# Patient Record
Sex: Male | Born: 1935
Health system: Southern US, Community
[De-identification: ages and names within clinical notes are randomized; demographics above are authoritative.]

## PROBLEM LIST (undated history)

## (undated) DIAGNOSIS — N183 Chronic kidney disease, stage 3 unspecified: Secondary | ICD-10-CM

## (undated) DIAGNOSIS — K5790 Diverticulosis of intestine, part unspecified, without perforation or abscess without bleeding: Secondary | ICD-10-CM

## (undated) DIAGNOSIS — Z86010 Personal history of colonic polyps: Secondary | ICD-10-CM

## (undated) DIAGNOSIS — I10 Essential (primary) hypertension: Secondary | ICD-10-CM

## (undated) DIAGNOSIS — K649 Unspecified hemorrhoids: Secondary | ICD-10-CM

## (undated) DIAGNOSIS — H9190 Unspecified hearing loss, unspecified ear: Secondary | ICD-10-CM

## (undated) DIAGNOSIS — C050 Malignant neoplasm of hard palate: Secondary | ICD-10-CM

## (undated) HISTORY — PX: INSERT / REPLACE / REMOVE PACEMAKER: SUR710

## (undated) HISTORY — DX: Unspecified hemorrhoids: K64.9

## (undated) HISTORY — DX: Chronic kidney disease, stage 3 (moderate): N18.3

## (undated) HISTORY — DX: Diverticulosis of intestine, part unspecified, without perforation or abscess without bleeding: K57.90

## (undated) HISTORY — DX: Chronic kidney disease, stage 3 unspecified: N18.30

## (undated) HISTORY — DX: Personal history of colonic polyps: Z86.010

## (undated) HISTORY — DX: Malignant neoplasm of hard palate: C05.0

---

## 2001-03-06 DIAGNOSIS — C050 Malignant neoplasm of hard palate: Secondary | ICD-10-CM

## 2001-03-06 HISTORY — PX: MAXILLECTOMY: SUR858

## 2001-03-06 HISTORY — DX: Malignant neoplasm of hard palate: C05.0

## 2001-05-09 ENCOUNTER — Encounter: Admission: RE | Admit: 2001-05-09 | Discharge: 2001-05-09 | Payer: Self-pay | Admitting: Otolaryngology

## 2001-05-09 ENCOUNTER — Encounter: Payer: Self-pay | Admitting: Otolaryngology

## 2001-05-10 ENCOUNTER — Ambulatory Visit (HOSPITAL_COMMUNITY): Admission: RE | Admit: 2001-05-10 | Discharge: 2001-05-12 | Payer: Self-pay | Admitting: Otolaryngology

## 2001-05-10 ENCOUNTER — Encounter (INDEPENDENT_AMBULATORY_CARE_PROVIDER_SITE_OTHER): Payer: Self-pay | Admitting: *Deleted

## 2004-03-06 DIAGNOSIS — Z86010 Personal history of colonic polyps: Secondary | ICD-10-CM

## 2004-03-06 DIAGNOSIS — Z860101 Personal history of adenomatous and serrated colon polyps: Secondary | ICD-10-CM

## 2004-03-06 HISTORY — DX: Personal history of adenomatous and serrated colon polyps: Z86.0101

## 2004-03-06 HISTORY — DX: Personal history of colonic polyps: Z86.010

## 2004-06-23 ENCOUNTER — Encounter: Admission: RE | Admit: 2004-06-23 | Discharge: 2004-06-23 | Payer: Self-pay | Admitting: Nephrology

## 2004-07-01 ENCOUNTER — Ambulatory Visit (HOSPITAL_COMMUNITY): Admission: RE | Admit: 2004-07-01 | Discharge: 2004-07-01 | Payer: Self-pay | Admitting: Nephrology

## 2004-09-15 ENCOUNTER — Ambulatory Visit: Payer: Self-pay | Admitting: Internal Medicine

## 2004-09-29 ENCOUNTER — Encounter (INDEPENDENT_AMBULATORY_CARE_PROVIDER_SITE_OTHER): Payer: Self-pay | Admitting: *Deleted

## 2004-09-29 ENCOUNTER — Ambulatory Visit: Payer: Self-pay | Admitting: Internal Medicine

## 2004-09-29 DIAGNOSIS — Z860100 Personal history of colon polyps, unspecified: Secondary | ICD-10-CM | POA: Insufficient documentation

## 2006-03-19 ENCOUNTER — Encounter: Admission: RE | Admit: 2006-03-19 | Discharge: 2006-03-19 | Payer: Self-pay | Admitting: *Deleted

## 2007-10-15 ENCOUNTER — Ambulatory Visit: Payer: Self-pay | Admitting: Internal Medicine

## 2007-10-29 ENCOUNTER — Ambulatory Visit: Payer: Self-pay | Admitting: Internal Medicine

## 2007-11-02 HISTORY — PX: COLONOSCOPY: SHX174

## 2008-04-01 ENCOUNTER — Encounter: Admission: RE | Admit: 2008-04-01 | Discharge: 2008-04-01 | Payer: Self-pay | Admitting: *Deleted

## 2010-01-25 ENCOUNTER — Encounter: Admission: RE | Admit: 2010-01-25 | Discharge: 2010-01-25 | Payer: Self-pay | Admitting: Internal Medicine

## 2010-07-22 NOTE — Op Note (Signed)
[place]. [facility]  Patient:    Wesley Garcia, Wesley Garcia Visit Number: [id number] MRN: [id number]          Service Type: DSU Location: [id number] Attending Physician:  Beckie Salts Dictated by:   Anabel Bene Constance Holster, M.D. Proc. Date: 05/10/01 Admit Date:  05/10/2001 Discharge Date: 05/12/2001   CC:         Robyn Haber, M.D.  Loralee Pacas. Terence Lux, D.D.S.   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  OPERATION PERFORMED:  Partial maxillectomy.  SURGEON:  Jefry H. Constance Holster, M.D.  ASSISTANT:  None.  ANESTHESIA:  General endotracheal.  COMPLICATIONS:  None.  ESTIMATED BLOOD LOSS:  120 cc.  FINDINGS:  Mainly ulcerative lesion infiltrating into the bone of the upper anterior alveolar ridge extending up approaching the piriform aperture without any direct actual extension in to the piriform aperture and without any obvious extension into the maxillary sinus.  REFERRING PHYSICIAN:  Robyn Haber, M.D.  INDICATIONS FOR PROCEDURE:  The patient is a 77 year old with a long history of poorly fitting dentures who has had a sore in the upper gum for many years but recently started to enlarge  and cause significant discomfort.  Biopsy was performed revealing well differentiated squamous cell carcinoma.  The risks, benefits, alternatives and complications of the procedure were explained to the patient who seemed to understand and agreed to surgery.  DESCRIPTION OF PROCEDURE:  The patient was taken to the operating room and placed on the operating table in the supine position.  Following induction of general endotracheal anesthesia via the nasal endotracheal route.  The face was prepped and draped in the standard fashion.  1 - Excision left superior pinna lesion.  1% Xylocaine  was infiltrated into the superior aspect of the left pinna.  An ellipse of skin approximately 1.5 cm by 0.75 cm was created with a 15 scalpel after infiltrating with Xylocaine with epinephrine.  The lesion was sent in its entirety for pathologic evaluation.  The incision was closed using running 5-0 nylon suture.  2 - Partial maxillectomy.  Electrocautery was used to outline the proposed incisions along the mucosa.  An incision was created in the inner surface of the upper lip across the midline extending down to the alveolar mucosa across the hard palate and back across the posterior aspect of the right lateral alveolar ridge.  Electrocautery was used to incise the mucosa and down through the submucosal tissue through muscular layers and eventually to the bone of the maxilla.  The superior most cut was just inferior to the infraorbital nerve.  The nerve itself was sacrificed and sent for margins with frozen section which were clear.  The face of the maxilla was first cut using a high speed electric drill with side-cutting bur.  The cut was continued down to the piriform aperture.  The nasal mucosa was left intact except for one small tear but the floor of the nose was sacrificed to provide a bony margin. The mucosa itself was healthy and clear of any obvious involvement with tumor.  The bony cuts were then continued down along the left anterior alveolar ridge and large osteotomes were used to perform osteotomies along the hard palate. After having completed all the bony cuts, the remaining soft tissue attachments were divided.  Any vessels identified were ligated with silk  suture. Electrocautery was used for the remainder of the hemostasis.  The specimen was removed in its entirely.  It contained the right anterior and lateral alveolar ridge, the nasal floor bone, approximately 1.5 to 2 cm from the nasal spine to the posterior aspect and the majority of the floor of the maxillary  sinus. The mucosa of the maxillary sinus was all intact except for right in the center of the specimen was a small mucosal cyst.  There was no obvious tumor identified in the mucosa.  All gross margins looked clear.  Additional soft tissue margins were taken around the soft tissue adjacent to the piriform aperture and the soft tissue deep to the lateral superior mucosal margin. These were all clear of any obvious carcinoma on frozen section.  In order to facilitate the dissection a lip-splitting approach was used as well.  The right side of the philtrum was incised, continued up around the lateral nasal ala on the right side.  The lip and cheek soft tissue was all retracted laterally.  This was all resutured at the end in two layers using 5-0 nylon on the skin carefully reapproximating the vermilion border.  The deep closure was performed with 4-0 chromic.  The vermilion mucosa was reapproximated with interrupted inverted 4-0 chromic sutures.  The palatal defect was packed with two sheets of Xeroform gauze and four additional sheets of Vaseline impregnated gauze.  A suspension bolster was created with 2-0 silk sutures starting from the midline palate mucosa to the lateral buccal mucosa.  This held the packing in place nicely.  At the termination of the procedure, the nasal tube was removed and the patient was reintubated orally.  The right nasal cavity was packed with a large Merocel which was coated with bacitracin ointment and injected with lidocaine with epinephrine solution.  The gastric contents were suctioned with a nasogastric tube.  The patient was awakened from anesthesia, extubated and transferred to recovery in stable condition.Dictated by:   Anabel Bene Constance Holster, M.D. Attending Physician:  Beckie Salts DD:  05/10/01 TD:  05/13/01 Job: [id number]

## 2011-02-05 ENCOUNTER — Emergency Department (HOSPITAL_COMMUNITY)
Admission: EM | Admit: 2011-02-05 | Discharge: 2011-02-05 | Disposition: A | Discharge status: Home / Self Care | Payer: Medicare Other | Attending: Emergency Medicine | Admitting: Emergency Medicine

## 2011-02-05 ENCOUNTER — Encounter: Payer: Self-pay | Admitting: *Deleted

## 2011-02-05 DIAGNOSIS — Z79899 Other long term (current) drug therapy: Secondary | ICD-10-CM | POA: Insufficient documentation

## 2011-02-05 DIAGNOSIS — R059 Cough, unspecified: Secondary | ICD-10-CM | POA: Insufficient documentation

## 2011-02-05 DIAGNOSIS — R070 Pain in throat: Secondary | ICD-10-CM | POA: Insufficient documentation

## 2011-02-05 DIAGNOSIS — R05 Cough: Secondary | ICD-10-CM | POA: Insufficient documentation

## 2011-02-05 DIAGNOSIS — R6889 Other general symptoms and signs: Secondary | ICD-10-CM | POA: Insufficient documentation

## 2011-02-05 DIAGNOSIS — J40 Bronchitis, not specified as acute or chronic: Secondary | ICD-10-CM

## 2011-02-05 DIAGNOSIS — R079 Chest pain, unspecified: Secondary | ICD-10-CM | POA: Insufficient documentation

## 2011-02-05 DIAGNOSIS — E119 Type 2 diabetes mellitus without complications: Secondary | ICD-10-CM | POA: Insufficient documentation

## 2011-02-05 MED ORDER — ACETAMINOPHEN-CODEINE #3 300-30 MG PO TABS
1.0000 | ORAL_TABLET | Freq: Once | ORAL | Status: AC
  Administered 2011-02-05: 1 via ORAL
  Filled 2011-02-05: qty 1

## 2011-02-05 MED ORDER — AZITHROMYCIN 250 MG PO TABS: ORAL_TABLET | ORAL | Status: AC

## 2011-02-05 MED ORDER — ALBUTEROL SULFATE HFA 108 (90 BASE) MCG/ACT IN AERS: 2.0000 | INHALATION_SPRAY | RESPIRATORY_TRACT | Status: DC | PRN

## 2011-02-05 MED ORDER — ALBUTEROL SULFATE HFA 108 (90 BASE) MCG/ACT IN AERS
2.0000 | INHALATION_SPRAY | RESPIRATORY_TRACT | Status: DC | PRN
  Administered 2011-02-05 (×2): 2 via RESPIRATORY_TRACT
  Filled 2011-02-05: qty 6.7

## 2011-02-05 NOTE — ED Notes (Signed)
Patient c/o chest congestion and cough onset 2 weeks ago. States his chest is hurting worse with cough. C/o bilateral earpain

## 2011-02-05 NOTE — ED Provider Notes (Signed)
History     CSN: [id number] Arrival date & time: 02/05/2011  6:44 AM   First MD Initiated Contact with Patient 02/05/11 [phone]      Chief Complaint  Patient presents with  . Sore Throat    nonproductive cough    (Consider location/radiation/quality/duration/timing/severity/associated sxs/prior treatment) HPI  Patient who is an insulin dependent diabetic presents to ER complaining of a 2 week hx of gradual onset cough that has been persistent with associated runny nose and mild sore throat. Patient states "I've been coughing so much it makes my ribs hurt." patient denies known fevers or chills. States he went to see his PCP, Dr. Mariea Clonts last week "and had my heart evaluated" but denies assessment of cough/runny nose/sore throat at that time. States he has been taking OTC cough and cold remedies without relief. Patient states he has had his pneumovax and flu shot this year. Denies known fevers, chills, HA, stiff neck, difficulty swallowing, CP, SOB, wheezing, abdominal pain, n/v/d. Denies aggravating or alleviating factors.   Past Medical History  Diagnosis Date  . Diabetes mellitus   . Renal disorder   . Cancer     Past Surgical History  Procedure Date  . Mouth surgery     History reviewed. No pertinent family history.  History  Substance Use Topics  . Smoking status: Never Smoker   . Smokeless tobacco: Not on file  . Alcohol Use: No      Review of Systems  All other systems reviewed and are negative.    Allergies  Review of patient's allergies indicates no known allergies.  Home Medications   Current Outpatient Rx  Name Route Sig Dispense Refill  . HYDROCHLOROTHIAZIDE 25 MG PO TABS Oral Take 25 mg by mouth daily.      . INSULIN GLARGINE 100 UNIT/ML [place] SOLN Subcutaneous Inject 16 Units into the skin daily.      Marland Kitchen PSEUDOEPHEDRINE HCL 120 MG PO TB12 Oral Take 120 mg by mouth every 12 (twelve) hours.      . ALBUTEROL SULFATE HFA 108 (90 BASE) MCG/ACT IN AERS Inhalation  Inhale 2 puffs into the lungs every 4 (four) hours as needed for wheezing. 1 Inhaler 0  . AZITHROMYCIN 250 MG PO TABS  Take 2 tablets by mouth the first day and then take 1 tablet by mouth for the next 4 days for a total of 5 days. 6 tablet 0    BP 144/81  Pulse 98  Temp(Src) 97.4 F (36.3 C) (Oral)  Resp 20  SpO2 98%  Physical Exam  Vitals reviewed. Constitutional: He is oriented to person, place, and time. He appears well-developed and well-nourished. No distress.  HENT:  Head: Normocephalic and atraumatic.  Right Ear: External ear normal.  Left Ear: External ear normal.  Nose: Nose normal.  Mouth/Throat: No oropharyngeal exudate.       Mild erythema of posterior pharynx and tonsils no tonsillar exudate or enlargement. Patent airway. Swallowing secretions well  Eyes: Conjunctivae and EOM are normal. Pupils are equal, round, and reactive to light.  Neck: Normal range of motion. Neck supple.  Cardiovascular: Normal rate, regular rhythm and normal heart sounds.  Exam reveals no gallop and no friction rub.   No murmur heard. Pulmonary/Chest: Effort normal and breath sounds normal. No respiratory distress. He has no wheezes. He has no rales. He exhibits no tenderness.  Abdominal: Soft. He exhibits no distension and no mass. There is no tenderness. There is no rebound and no guarding.  Lymphadenopathy:  He has no cervical adenopathy.  Neurological: He is alert and oriented to person, place, and time. He has normal reflexes.  Skin: Skin is warm and dry. No rash noted. He is not diaphoretic.  Psychiatric: He has a normal mood and affect.    ED Course  Procedures (including critical care time)  Albuterol inhaler to bedside and PO tylenol #3  Labs Reviewed  RAPID STREP SCREEN  LAB REPORT - SCANNED   No results found.   1. Bronchitis       MDM  Afebrile, non toxic appearing, with pulse ox 98% on room air. Cough x 2 weeks without fever and normal lung exam likely  bronchitis but will cover with abx due to age and duration of cough.   Medical screening examination/treatment/procedure(s) were performed by non-physician practitioner and as supervising physician I was immediately available for consultation/collaboration. Katy Apo, M.D.       Eben Burow, [place] 02/05/11 0840  Mylinda Latina III, MD 02/06/11 [phone]

## 2011-06-05 ENCOUNTER — Other Ambulatory Visit: Payer: Self-pay | Admitting: Internal Medicine

## 2011-06-05 ENCOUNTER — Ambulatory Visit
Admission: RE | Admit: 2011-06-05 | Discharge: 2011-06-05 | Disposition: A | Discharge status: Home / Self Care | Payer: Medicare Other | Source: Ambulatory Visit | Attending: Internal Medicine | Admitting: Internal Medicine

## 2011-06-05 DIAGNOSIS — R52 Pain, unspecified: Secondary | ICD-10-CM

## 2011-07-18 ENCOUNTER — Other Ambulatory Visit: Payer: Self-pay | Admitting: Nephrology

## 2011-07-18 DIAGNOSIS — R52 Pain, unspecified: Secondary | ICD-10-CM

## 2011-07-18 DIAGNOSIS — N183 Chronic kidney disease, stage 3 unspecified: Secondary | ICD-10-CM

## 2011-07-19 ENCOUNTER — Ambulatory Visit
Admission: RE | Admit: 2011-07-19 | Discharge: 2011-07-19 | Disposition: A | Discharge status: Home / Self Care | Payer: Medicare Other | Source: Ambulatory Visit | Attending: Nephrology | Admitting: Nephrology

## 2011-07-19 DIAGNOSIS — N183 Chronic kidney disease, stage 3 unspecified: Secondary | ICD-10-CM

## 2011-07-19 DIAGNOSIS — R52 Pain, unspecified: Secondary | ICD-10-CM

## 2011-08-02 ENCOUNTER — Other Ambulatory Visit: Payer: Self-pay | Admitting: Nephrology

## 2011-08-02 DIAGNOSIS — R109 Unspecified abdominal pain: Secondary | ICD-10-CM

## 2011-08-02 DIAGNOSIS — N183 Chronic kidney disease, stage 3 unspecified: Secondary | ICD-10-CM

## 2011-08-04 ENCOUNTER — Ambulatory Visit
Admission: RE | Admit: 2011-08-04 | Discharge: 2011-08-04 | Disposition: A | Discharge status: Home / Self Care | Payer: Medicare Other | Source: Ambulatory Visit | Attending: Nephrology | Admitting: Nephrology

## 2011-08-04 DIAGNOSIS — R109 Unspecified abdominal pain: Secondary | ICD-10-CM

## 2011-08-04 DIAGNOSIS — N183 Chronic kidney disease, stage 3 unspecified: Secondary | ICD-10-CM

## 2012-06-05 ENCOUNTER — Other Ambulatory Visit: Payer: Medicare Other

## 2012-06-05 ENCOUNTER — Other Ambulatory Visit: Payer: Self-pay | Admitting: *Deleted

## 2012-06-05 DIAGNOSIS — IMO0001 Reserved for inherently not codable concepts without codable children: Secondary | ICD-10-CM

## 2012-06-05 DIAGNOSIS — I10 Essential (primary) hypertension: Secondary | ICD-10-CM

## 2012-06-05 DIAGNOSIS — E785 Hyperlipidemia, unspecified: Secondary | ICD-10-CM

## 2012-06-06 LAB — LIPID PANEL
Chol/HDL Ratio: 1.7 ratio units (ref 0.0–5.0)
Cholesterol, Total: 96 mg/dL — ABNORMAL LOW (ref 100–199)
HDL: 58 mg/dL (ref >39)
LDL Calculated: 29 mg/dL (ref 0–99)
Triglycerides: 47 mg/dL (ref 0–149)
VLDL Cholesterol Cal: 9 mg/dL (ref 5–40)

## 2012-06-06 LAB — MICROALBUMIN / CREATININE URINE RATIO
Creatinine, Ur: 54.6 mg/dL (ref 22.0–328.0)
MICROALB/CREAT RATIO: 34.2 mg/g creat — ABNORMAL HIGH (ref 0.0–30.0)
Microalbumin, Urine: 18.7 ug/mL — ABNORMAL HIGH (ref 0.0–17.0)

## 2012-06-06 LAB — COMPREHENSIVE METABOLIC PANEL
ALT: 14 IU/L (ref 0–44)
AST: 18 IU/L (ref 0–40)
Albumin/Globulin Ratio: 1.8 (ref 1.1–2.5)
Albumin: 4.2 g/dL (ref 3.5–4.7)
Alkaline Phosphatase: 65 IU/L (ref 39–117)
BUN/Creatinine Ratio: 10 (ref 10–22)
BUN: 23 mg/dL (ref 8–27)
CO2: 23 mmol/L (ref 19–28)
Calcium: 9.7 mg/dL (ref 8.6–10.2)
Chloride: 103 mmol/L (ref 97–108)
Creatinine, Ser: 2.28 mg/dL — ABNORMAL HIGH (ref 0.76–1.27)
GFR calc Af Amer: 30 mL/min/{1.73_m2} — ABNORMAL LOW (ref >59)
GFR calc non Af Amer: 26 mL/min/{1.73_m2} — ABNORMAL LOW (ref >59)
Globulin, Total: 2.4 g/dL (ref 1.5–4.5)
Glucose: 93 mg/dL (ref 65–99)
Potassium: 4.2 mmol/L (ref 3.5–5.2)
Sodium: 142 mmol/L (ref 134–144)
Total Bilirubin: 0.8 mg/dL (ref 0.0–1.2)
Total Protein: 6.6 g/dL (ref 6.0–8.5)

## 2012-06-06 LAB — HEMOGLOBIN A1C
Est. average glucose Bld gHb Est-mCnc: 151 mg/dL
Hgb A1c MFr Bld: 6.9 % — ABNORMAL HIGH (ref 4.8–5.6)

## 2012-06-09 ENCOUNTER — Encounter: Payer: Self-pay | Admitting: Pharmacotherapy

## 2012-06-10 ENCOUNTER — Encounter: Payer: Self-pay | Admitting: Pharmacotherapy

## 2012-06-10 ENCOUNTER — Ambulatory Visit (INDEPENDENT_AMBULATORY_CARE_PROVIDER_SITE_OTHER): Payer: Medicare Other | Admitting: Pharmacotherapy

## 2012-06-10 VITALS — BP 130/82 | HR 41 | Temp 97.9°F | Resp 14 | Wt 163.0 lb

## 2012-06-10 DIAGNOSIS — I129 Hypertensive chronic kidney disease with stage 1 through stage 4 chronic kidney disease, or unspecified chronic kidney disease: Secondary | ICD-10-CM | POA: Insufficient documentation

## 2012-06-10 DIAGNOSIS — E1129 Type 2 diabetes mellitus with other diabetic kidney complication: Secondary | ICD-10-CM

## 2012-06-10 DIAGNOSIS — E1169 Type 2 diabetes mellitus with other specified complication: Secondary | ICD-10-CM | POA: Insufficient documentation

## 2012-06-10 DIAGNOSIS — E785 Hyperlipidemia, unspecified: Secondary | ICD-10-CM

## 2012-06-10 NOTE — Progress Notes (Signed)
  Subjective:    Wesley Garcia is a 79 y.o. male who presents for follow-up of Type 2 diabetes mellitus.    Current A1C is 6.9%  His lowest BG has been 49m/dl.  Corrects with regular soda.   Average BG:  1361mdl He is currently on Lantus 20 units daily He is currently on Humalog 3 units breakfast, 3 units lunch, 6 units supper  He is eating smaller portion sizes and making healthier choices. He is doing regular exercise.  He is going to an arthritis and memory program that helps with balance.  He also goes dancing 2-3 times a week. Denies problems with feet. Some blurry vision.  Last eye exam was 2 months ago. [facility]    Review of Systems A comprehensive review of systems was negative except for: Eyes: positive for blurry vision Genitourinary: positive for nocturia    Objective:    BP 180/100  Pulse 41  Temp(Src) 97.9 F (36.6 C) (Oral)  Resp 14  Wt 163 lb (73.936 kg)  SpO2 99%  General:  alert, cooperative, appears stated age and no distress  Oropharynx: normal findings: lips normal without lesions and gums healthy   Eyes:  negative findings: lids and lashes normal, conjunctivae and sclerae normal and corneas clear           Lung: clear to auscultation bilaterally  Heart:  regular rate and rhythm, S1, S2 normal, no murmur, click, rub or gallop     Extremities: extremities normal, atraumatic, no cyanosis or edema  Skin: warm and dry, no hyperpigmentation, vitiligo, or suspicious lesions         Lab Review Glucose (mg/dL)  Date Value  06/05/2012 93      CO2 (mmol/L)  Date Value  06/05/2012 23      BUN (mg/dL)  Date Value  06/05/2012 23      Creatinine, Ser (mg/dL)  Date Value  06/05/2012 2.28*    A1C 6.9% Total cholesterol:  96 LDL:  29 HDL:  58 Triglycerides:  47 Microalbumin:  18.7   Assessment:    Diabetes Mellitus type II, under excellent control.   HTN goal BP <140/80  LDL goal <100   Plan:    1.  Rx changes:  Will decrease Lantus to 18 units due to some early morning blood glucose in the 70-80 range 2.  Continue Novolog 3 units with breakfast and lunch, 6 units with supper.  He is to lower dose of Novolog if he is going dancing. 3.  Counseled on treatment of hypoglycemia. 4.  Counseled on nutrition goals. 5.  Praised exercise efforts. 6.  HTN - BP at goal at recheck.  Initial BP was 180/100.  I rechecked it twice during visit - sitting on bare arm 130/82, 132/78.  Dr. WeJustin Mendnephrologist) has stopped all antihypertensive medications. 7.  Lipids are ideal on no medications.

## 2012-06-20 ENCOUNTER — Other Ambulatory Visit: Payer: Self-pay | Admitting: Geriatric Medicine

## 2012-06-20 MED ORDER — INSULIN PEN NEEDLE 31G X 5 MM MISC: Status: DC

## 2012-07-05 ENCOUNTER — Other Ambulatory Visit: Payer: Self-pay | Admitting: *Deleted

## 2012-07-05 MED ORDER — INSULIN GLARGINE 100 UNIT/ML ~~LOC~~ SOLN: SUBCUTANEOUS | Status: DC

## 2012-07-25 ENCOUNTER — Other Ambulatory Visit: Payer: Self-pay | Admitting: Internal Medicine

## 2012-07-25 DIAGNOSIS — I10 Essential (primary) hypertension: Secondary | ICD-10-CM

## 2012-07-25 MED ORDER — AMLODIPINE BESYLATE 2.5 MG PO TABS: 2.5000 mg | ORAL_TABLET | Freq: Every day | ORAL | Status: DC

## 2012-09-16 ENCOUNTER — Ambulatory Visit (INDEPENDENT_AMBULATORY_CARE_PROVIDER_SITE_OTHER): Payer: Self-pay | Admitting: Pharmacotherapy

## 2012-09-16 ENCOUNTER — Encounter: Payer: Self-pay | Admitting: Pharmacotherapy

## 2012-09-16 VITALS — BP 150/82 | HR 67 | Temp 97.4°F | Resp 16 | Ht 67.0 in | Wt 165.0 lb

## 2012-09-16 DIAGNOSIS — E1129 Type 2 diabetes mellitus with other diabetic kidney complication: Secondary | ICD-10-CM

## 2012-09-16 DIAGNOSIS — E785 Hyperlipidemia, unspecified: Secondary | ICD-10-CM

## 2012-09-16 DIAGNOSIS — I10 Essential (primary) hypertension: Secondary | ICD-10-CM

## 2012-09-16 NOTE — Patient Instructions (Signed)
Continue Lantus 18 units daily Continue Novolog 4 units breakfast & lunch, 7 units with supper

## 2012-09-16 NOTE — Progress Notes (Signed)
  Subjective:    Wesley Garcia is a 79 y.o. male who presents for follow-up of Type 2 diabetes mellitus.   Last A1C was 6.9% He did eat breakfast this morning. Rare hypoglycemia Average BG for 90 days is 131mg /dl He is on Lantus 18 units daily He is taking Novolog 4 units breakfast and lunch, 7 units supper. He is dancing on Thursdays and Fridays. Trying to eat healthy meals - mostly veggies.  Does eat chicken. Denies problems with feet. Some blurry vision.  Quit going to [facility].  Sees Dr. Katy Fitch locally.  Just got new glasses. Nocturia every night at least once or twice.  Went to nephrologist.  Says a "small pill" was added for BP Now on amlodipine. Some peripheral edema  Rare alcohol.  Drinks beer when he has pizza.   Review of Systems A comprehensive review of systems was negative except for: Eyes: positive for blurry vision Genitourinary: positive for nocturia    Objective:    BP 150/82  Pulse 67  Temp(Src) 97.4 F (36.3 C) (Oral)  Resp 16  Ht 5\' 7"  (1.702 m)  Wt 165 lb (74.844 kg)  BMI 25.84 kg/m2  SpO2 98%  General:  alert, cooperative, no distress and appears younger than stated age  Oropharynx: normal findings: lips normal without lesions and gums healthy   Eyes:  negative findings: lids and lashes normal and corneas clear   Ears:  external ears normal        Lung: clear to auscultation bilaterally  Heart:  regular rate and rhythm     Extremities: no edema noted on today  Skin: warm and dry, no hyperpigmentation, vitiligo, or suspicious lesions     Neuro: mental status, speech normal, alert and oriented x3 and gait and station normal   Lab Review Glucose (mg/dL)  Date Value  06/05/2012 93      CO2 (mmol/L)  Date Value  06/05/2012 23      BUN (mg/dL)  Date Value  06/05/2012 23      Creatinine, Ser (mg/dL)  Date Value  06/05/2012 2.28*     Assessment:    Diabetes Mellitus type II, under excellent control.  BP is above goal <140/80  Watched by  nephrologist.   Plan:    1.  Rx changes: none 2.  Continue Lantus 18 units daily. 3.  Continue Novolog 4 units with breakfast & lunch, 7 units with supper. 4.  Reviewed nutrition goals.  Making healthy choices. 5.  Continue with dancing for exercise. 6.  BP above target today.  Now on amlodipine 2.5mg  QD.  He is being monitored closely by nephrology. 7.  Will obtain A1C and CMP today.  8.  RTC in 4 months.  A1C, CMP, microalbumin, and lipids prior to next OV.

## 2012-09-17 LAB — COMPREHENSIVE METABOLIC PANEL
ALT: 16 IU/L (ref 0–44)
AST: 19 IU/L (ref 0–40)
Albumin/Globulin Ratio: 1.7 (ref 1.1–2.5)
Albumin: 4.3 g/dL (ref 3.5–4.7)
Alkaline Phosphatase: 79 IU/L (ref 39–117)
BUN/Creatinine Ratio: 12 (ref 10–22)
BUN: 27 mg/dL (ref 8–27)
CO2: 24 mmol/L (ref 18–29)
Calcium: 9.7 mg/dL (ref 8.6–10.2)
Chloride: 102 mmol/L (ref 97–108)
Creatinine, Ser: 2.2 mg/dL — ABNORMAL HIGH (ref 0.76–1.27)
GFR calc Af Amer: 32 mL/min/{1.73_m2} — ABNORMAL LOW (ref >59)
GFR calc non Af Amer: 27 mL/min/{1.73_m2} — ABNORMAL LOW (ref >59)
Globulin, Total: 2.5 g/dL (ref 1.5–4.5)
Glucose: 158 mg/dL — ABNORMAL HIGH (ref 65–99)
Potassium: 4.2 mmol/L (ref 3.5–5.2)
Sodium: 141 mmol/L (ref 134–144)
Total Bilirubin: 0.4 mg/dL (ref 0.0–1.2)
Total Protein: 6.8 g/dL (ref 6.0–8.5)

## 2012-09-17 LAB — HEMOGLOBIN A1C
Est. average glucose Bld gHb Est-mCnc: 140 mg/dL
Hgb A1c MFr Bld: 6.5 % — ABNORMAL HIGH (ref 4.8–5.6)

## 2012-10-03 ENCOUNTER — Encounter: Payer: Self-pay | Admitting: Internal Medicine

## 2012-10-07 ENCOUNTER — Encounter: Payer: Self-pay | Admitting: Internal Medicine

## 2012-10-25 ENCOUNTER — Other Ambulatory Visit: Payer: Self-pay | Admitting: Geriatric Medicine

## 2012-10-25 MED ORDER — INSULIN GLARGINE 100 UNIT/ML SOLOSTAR PEN: PEN_INJECTOR | SUBCUTANEOUS | Status: DC

## 2012-10-25 MED ORDER — INSULIN ASPART 100 UNIT/ML FLEXPEN: PEN_INJECTOR | SUBCUTANEOUS | Status: DC

## 2012-10-28 ENCOUNTER — Encounter: Payer: Self-pay | Admitting: Pharmacotherapy

## 2012-10-28 ENCOUNTER — Ambulatory Visit (INDEPENDENT_AMBULATORY_CARE_PROVIDER_SITE_OTHER): Payer: Medicare Other | Admitting: Pharmacotherapy

## 2012-10-28 VITALS — BP 146/72 | HR 64 | Temp 97.7°F | Resp 12 | Ht 67.0 in | Wt 162.0 lb

## 2012-10-28 DIAGNOSIS — E1129 Type 2 diabetes mellitus with other diabetic kidney complication: Secondary | ICD-10-CM

## 2012-10-28 DIAGNOSIS — I10 Essential (primary) hypertension: Secondary | ICD-10-CM

## 2012-10-28 MED ORDER — INSULIN LISPRO 100 UNIT/ML (KWIKPEN): PEN_INJECTOR | SUBCUTANEOUS | Status: DC

## 2012-10-28 NOTE — Progress Notes (Signed)
  Subjective:    Wesley Garcia is a 79 y.o. male who presents for follow-up of Type 2 diabetes mellitus.  His last A1C was better at 6.5% He ran out of samples of his Novolog.  He cannot afford to take the Novolog. He says his blood glucose has been good.  No hypoglycemia. Continues to dance for exercise. Sees nephrologist regularly. Continues Lantus.   Review of Systems A comprehensive review of systems was negative.    Objective:    BP 146/72  Pulse 64  Temp(Src) 97.7 F (36.5 C) (Oral)  Resp 12  Ht 5\' 7"  (1.702 m)  Wt 162 lb (73.483 kg)  BMI 25.37 kg/m2  SpO2 99%  General:  alert, cooperative and no distress  Oropharynx: normal findings: lips normal without lesions and gums healthy   Eyes:  negative findings: lids and lashes normal and corneas clear   Ears:  external ears normal        Lung: clear to auscultation bilaterally  Heart:  regular rate and rhythm     Extremities: no edema  Skin: warm and dry, no hyperpigmentation, vitiligo, or suspicious lesions     Neuro: mental status, speech normal, alert and oriented x3 and gait and station normal   Lab Review Glucose (mg/dL)  Date Value  09/16/2012 158*  06/05/2012 93      CO2 (mmol/L)  Date Value  09/16/2012 24   06/05/2012 23      BUN (mg/dL)  Date Value  09/16/2012 27   06/05/2012 23      Creatinine, Ser (mg/dL)  Date Value  09/16/2012 2.20*  06/05/2012 2.28*     09/16/12:  A1C 6.5% AST:  19 ALT:  16 Assessment:    Diabetes Mellitus type II, under excellent control.  BP goal <140/80 - above target.   Plan:    1.  Rx changes: will change Novolog to Humalog so he can take advantage of voucher for 5 free pens. 2.  Continue Lantus 18 units daily. 3.  BP above target today, he is closely followed by nephrology.  Will continue Amlodipine. 4.  Keep scheduled OV.

## 2012-10-28 NOTE — Patient Instructions (Signed)
Keep scheduled office visit.

## 2012-11-14 ENCOUNTER — Ambulatory Visit (INDEPENDENT_AMBULATORY_CARE_PROVIDER_SITE_OTHER): Payer: Medicare Other | Admitting: Internal Medicine

## 2012-11-14 ENCOUNTER — Encounter: Payer: Self-pay | Admitting: Internal Medicine

## 2012-11-14 VITALS — BP 112/70 | HR 72 | Ht 67.0 in | Wt 166.0 lb

## 2012-11-14 DIAGNOSIS — R198 Other specified symptoms and signs involving the digestive system and abdomen: Secondary | ICD-10-CM

## 2012-11-14 DIAGNOSIS — R194 Change in bowel habit: Secondary | ICD-10-CM

## 2012-11-14 DIAGNOSIS — Z86010 Personal history of colonic polyps: Secondary | ICD-10-CM

## 2012-11-14 MED ORDER — NA SULFATE-K SULFATE-MG SULF 17.5-3.13-1.6 GM/177ML PO SOLN: 1.0000 | Freq: Once | ORAL | Status: DC

## 2012-11-14 NOTE — Patient Instructions (Addendum)
You have been scheduled for a colonoscopy with propofol. Please follow written instructions given to you at your visit today.  Please pick up your prep kit at the pharmacy within the next 1-3 days. If you use inhalers (even only as needed), please bring them with you on the day of your procedure. Your physician has requested that you go to www.[email or web address].com and enter the access code given to you at your visit today. This web site gives a general overview about your procedure. However, you should still follow specific instructions given to you by our office regarding your preparation for the procedure. CC:  Wesley Garcia

## 2012-11-14 NOTE — Progress Notes (Signed)
  Subjective:    Patient ID: Wesley Garcia, male    DOB: 1929/07/26, 79 y.o.   MRN: [id number]  HPI Having several bowel movements a day - starts out formed then more softer and up to 4 a day. Otherwise feeling ok - no bleeding Hx of colon polyps in 2006 and 2009. ? repeat  Medications, allergies, past medical history, past surgical history, family history and social history are reviewed and updated in the EMR.  Review of Systems Still active [facility] to [facility] to fish earlyOct    Objective:   Physical Exam General:  NAD Eyes:   anicteric Lungs:  clear Heart:  S1S2 no rubs, murmurs or gallops Psych:  appropriate   Data Reviewed:  2009 colonoscopy- diminutive rectal polyp removed, not retrieved 2006 colon polyp pathology1. COLON, POLYP(S): ADENOMATOUS POLYP(S). NO HIGH GRADE DYSPLASIA OR INVASIVE MALIGNANCY IDENTIFIED. (BIOPSIES, CECUM)  2. COLON, POLYP(S): VILLOUS ADENOMA. NO HIGH GRADE DYSPLASIA OR MALIGNANCY IDENTIFIED. (BIOPSIES, ASCENDING)  3. COLON, POLYP(S): ADENOMATOUS POLYP(S). NO HIGH GRADE DYSPLASIA OR INVASIVE MALIGNANCY IDENTIFIED. (BIOPSIES, SPLENIC FLEXURE)         Assessment & Plan:  Change in bowel habits - loose and more frequent stools - cause not clear - does not seem to be medication Personal history of colonic polyps and rectal polyp  1. Evaluate with colonoscopy. The risks and benefits as well as alternatives of endoscopic procedure(s) have been discussed and reviewed. All questions answered. The patient agrees to proceed.

## 2012-12-16 ENCOUNTER — Ambulatory Visit: Payer: Medicare Other

## 2012-12-16 DIAGNOSIS — Z23 Encounter for immunization: Secondary | ICD-10-CM

## 2012-12-30 ENCOUNTER — Encounter: Payer: Medicare Other | Admitting: Internal Medicine

## 2013-01-10 ENCOUNTER — Other Ambulatory Visit: Payer: Medicare Other

## 2013-01-10 DIAGNOSIS — E1129 Type 2 diabetes mellitus with other diabetic kidney complication: Secondary | ICD-10-CM

## 2013-01-10 DIAGNOSIS — E785 Hyperlipidemia, unspecified: Secondary | ICD-10-CM

## 2013-01-11 LAB — COMPREHENSIVE METABOLIC PANEL
ALT: 12 IU/L (ref 0–44)
AST: 17 IU/L (ref 0–40)
Albumin/Globulin Ratio: 1.8 (ref 1.1–2.5)
Albumin: 4.2 g/dL (ref 3.5–4.7)
Alkaline Phosphatase: 68 IU/L (ref 39–117)
BUN/Creatinine Ratio: 10 (ref 10–22)
BUN: 20 mg/dL (ref 8–27)
CO2: 27 mmol/L (ref 18–29)
Calcium: 9.4 mg/dL (ref 8.6–10.2)
Chloride: 101 mmol/L (ref 97–108)
Creatinine, Ser: 2.08 mg/dL — ABNORMAL HIGH (ref 0.76–1.27)
GFR calc Af Amer: 34 mL/min/{1.73_m2} — ABNORMAL LOW (ref >59)
GFR calc non Af Amer: 29 mL/min/{1.73_m2} — ABNORMAL LOW (ref >59)
Globulin, Total: 2.4 g/dL (ref 1.5–4.5)
Glucose: 104 mg/dL — ABNORMAL HIGH (ref 65–99)
Potassium: 4.5 mmol/L (ref 3.5–5.2)
Sodium: 141 mmol/L (ref 134–144)
Total Bilirubin: 0.7 mg/dL (ref 0.0–1.2)
Total Protein: 6.6 g/dL (ref 6.0–8.5)

## 2013-01-11 LAB — MICROALBUMIN / CREATININE URINE RATIO
Creatinine, Ur: 103.9 mg/dL (ref 22.0–328.0)
MICROALB/CREAT RATIO: 19.1 mg/g creat (ref 0.0–30.0)
Microalbumin, Urine: 19.8 ug/mL — ABNORMAL HIGH (ref 0.0–17.0)

## 2013-01-11 LAB — HEMOGLOBIN A1C
Est. average glucose Bld gHb Est-mCnc: 146 mg/dL
Hgb A1c MFr Bld: 6.7 % — ABNORMAL HIGH (ref 4.8–5.6)

## 2013-01-11 LAB — LIPID PANEL
Chol/HDL Ratio: 1.5 ratio units (ref 0.0–5.0)
Cholesterol, Total: 89 mg/dL — ABNORMAL LOW (ref 100–199)
HDL: 59 mg/dL (ref >39)
LDL Calculated: 24 mg/dL (ref 0–99)
Triglycerides: 29 mg/dL (ref 0–149)
VLDL Cholesterol Cal: 6 mg/dL (ref 5–40)

## 2013-01-13 ENCOUNTER — Encounter: Payer: Self-pay | Admitting: Pharmacotherapy

## 2013-01-13 ENCOUNTER — Ambulatory Visit (INDEPENDENT_AMBULATORY_CARE_PROVIDER_SITE_OTHER): Payer: Medicare Other | Admitting: Pharmacotherapy

## 2013-01-13 VITALS — BP 126/82 | HR 55 | Temp 97.3°F | Wt 162.4 lb

## 2013-01-13 DIAGNOSIS — I10 Essential (primary) hypertension: Secondary | ICD-10-CM

## 2013-01-13 DIAGNOSIS — E785 Hyperlipidemia, unspecified: Secondary | ICD-10-CM

## 2013-01-13 DIAGNOSIS — E1129 Type 2 diabetes mellitus with other diabetic kidney complication: Secondary | ICD-10-CM

## 2013-01-13 MED ORDER — INSULIN LISPRO 100 UNIT/ML (KWIKPEN): PEN_INJECTOR | SUBCUTANEOUS | Status: DC

## 2013-01-13 NOTE — Progress Notes (Signed)
  Subjective:    Wesley Garcia is a 79 y.o.white male who presents for follow-up of Type 2 diabetes mellitus.   A1C 6.7% Currently on Lantus and Humalog. Forgot to bring blood glucose meter. Self-reported average BG:  130m/dl Hypoglycemia is rare.  He is watching portion sizes. Dancing for exercise twice a week. Denies problems with feet. Complains that vision is not as good (blurry) when he is on the computer too long. Nocturia at least twice per night.   Review of Systems A comprehensive review of systems was negative except for: Eyes: positive for blurry    Objective:    BP 170/90  Pulse 55  Temp(Src) 97.3 F (36.3 C) (Oral)  Wt 162 lb 6.4 oz (73.664 kg)  SpO2 99%  General:  alert, cooperative and no distress  Oropharynx: normal findings: lips normal without lesions and gums healthy   Eyes:  negative findings: lids and lashes normal and conjunctivae and sclerae normal   Ears:  normal TM's and external ear canals both ears and external ears normal        Lung: clear to auscultation bilaterally  Heart:  regular rate and rhythm     Extremities: no edema        Neuro: mental status, speech normal, alert and oriented x3 and gait and station normal   Lab Review Glucose (mg/dL)  Date Value  01/10/2013 104*  09/16/2012 158*  06/05/2012 93      CO2 (mmol/L)  Date Value  01/10/2013 27   09/16/2012 24   06/05/2012 23      BUN (mg/dL)  Date Value  01/10/2013 20   09/16/2012 27   06/05/2012 23      Creatinine, Ser (mg/dL)  Date Value  01/10/2013 2.08*  09/16/2012 2.20*  06/05/2012 2.28*    November 2014: A1C 6.7% AST:  17 ALT:  12 Total cholesterol:  89 Triglycerides:  29 HDL:  59 LDL:  24 Microalbumin:   19.8  Assessment:    Diabetes Mellitus type II, under excellent control.  Repeat BG at goal <140/80 LDL at goal <100   Plan:    1.  Rx changes: none 2.  Continue Lantus 14 units daily. 3.  Continue Humalog 4 units breakfast and lunch, 7 units with  supper. 4.  Discussed why his body needs supplemental insulin.  He has had DM 30+ years.  OTC supplements will not cure DM. 5.  Reviewed nutrition goals. 6.  Praised exercise efforts. 7.  HTN at goal <140/80 on repeat BP.  Continue amlodipine. 8.  LDL at goal on no statin therapy.

## 2013-01-13 NOTE — Patient Instructions (Signed)
Stay on same dose of insulin. Keep up good work

## 2013-01-24 LAB — HM COLONOSCOPY

## 2013-01-28 ENCOUNTER — Ambulatory Visit (AMBULATORY_SURGERY_CENTER): Payer: Medicare Other | Admitting: Internal Medicine

## 2013-01-28 ENCOUNTER — Encounter: Payer: Medicare Other | Admitting: Internal Medicine

## 2013-01-28 ENCOUNTER — Encounter: Payer: Self-pay | Admitting: Internal Medicine

## 2013-01-28 VITALS — BP 188/88 | HR 58 | Temp 96.9°F | Resp 17 | Ht 67.0 in | Wt 166.0 lb

## 2013-01-28 DIAGNOSIS — D126 Benign neoplasm of colon, unspecified: Secondary | ICD-10-CM

## 2013-01-28 DIAGNOSIS — K5730 Diverticulosis of large intestine without perforation or abscess without bleeding: Secondary | ICD-10-CM

## 2013-01-28 DIAGNOSIS — Z860100 Personal history of colon polyps, unspecified: Secondary | ICD-10-CM

## 2013-01-28 LAB — GLUCOSE, CAPILLARY: Glucose-Capillary: 149 mg/dL — ABNORMAL HIGH (ref 70–99)

## 2013-01-28 MED ORDER — SODIUM CHLORIDE 0.9 % IV SOLN: 500.0000 mL | INTRAVENOUS | Status: DC

## 2013-01-28 NOTE — Progress Notes (Signed)
Report to pacu rn, vss, bbs=clear 

## 2013-01-28 NOTE — Patient Instructions (Addendum)
I found and removed 6 polyps. I doubt they were making your stools loose but it is possible.  If your stools remain loose try taking 1 tablespoon of benefiber each day.  I will send a letter about the polyps (look benign) and follow-up.  I appreciate the opportunity to care for you. Gatha Mayer, MD, [facility]   Discharge instructions given with verbal understanding. Handouts on polyps and diverticulosis. Resume previous medications. Hold aspirin and aspirin products for two weeks. YOU HAD AN ENDOSCOPIC PROCEDURE TODAY AT [facility]: Refer to the procedure report that was given to you for any specific questions about what was found during the examination.  If the procedure report does not answer your questions, please call your gastroenterologist to clarify.  If you requested that your care partner not be given the details of your procedure findings, then the procedure report has been included in a sealed envelope for you to review at your convenience later.  YOU SHOULD EXPECT: Some feelings of bloating in the abdomen. Passage of more gas than usual.  Walking can help get rid of the air that was put into your GI tract during the procedure and reduce the bloating. If you had a lower endoscopy (such as a colonoscopy or flexible sigmoidoscopy) you may notice spotting of blood in your stool or on the toilet paper. If you underwent a bowel prep for your procedure, then you may not have a normal bowel movement for a few days.  DIET: Your first meal following the procedure should be a light meal and then it is ok to progress to your normal diet.  A half-sandwich or bowl of soup is an example of a good first meal.  Heavy or fried foods are harder to digest and may make you feel nauseous or bloated.  Likewise meals heavy in dairy and vegetables can cause extra gas to form and this can also increase the bloating.  Drink plenty of fluids but you should avoid alcoholic beverages for 24  hours.  ACTIVITY: Your care partner should take you home directly after the procedure.  You should plan to take it easy, moving slowly for the rest of the day.  You can resume normal activity the day after the procedure however you should NOT DRIVE or use heavy machinery for 24 hours (because of the sedation medicines used during the test).    SYMPTOMS TO REPORT IMMEDIATELY: A gastroenterologist can be reached at any hour.  During normal business hours, 8:30 AM to 5:00 PM Monday through Friday, call [phone].  After hours and on weekends, please call the GI answering service at [phone] who will take a message and have the physician on call contact you.   Following lower endoscopy (colonoscopy or flexible sigmoidoscopy):  Excessive amounts of blood in the stool  Significant tenderness or worsening of abdominal pains  Swelling of the abdomen that is new, acute  Fever of 100F or higher  FOLLOW UP: If any biopsies were taken you will be contacted by phone or by letter within the next 1-3 weeks.  Call your gastroenterologist if you have not heard about the biopsies in 3 weeks.  Our staff will call the home number listed on your records the next business day following your procedure to check on you and address any questions or concerns that you may have at that time regarding the information given to you following your procedure. This is a courtesy call and so if there is  no answer at the home number and we have not heard from you through the emergency physician on call, we will assume that you have returned to your regular daily activities without incident.  SIGNATURES/CONFIDENTIALITY: You and/or your care partner have signed paperwork which will be entered into your electronic medical record.  These signatures attest to the fact that that the information above on your After Visit Summary has been reviewed and is understood.  Full responsibility of the confidentiality of this discharge  information lies with you and/or your care-partner.

## 2013-01-28 NOTE — Progress Notes (Signed)
Patient did not experience any of the following events: a burn prior to discharge; a fall within the facility; wrong site/side/patient/procedure/implant event; or a hospital transfer or hospital admission upon discharge from the facility. (G8907) Patient did not have preoperative order for IV antibiotic SSI prophylaxis. (G8918)  

## 2013-01-28 NOTE — Op Note (Signed)
[place]  [address] [place], [id number]   COLONOSCOPY PROCEDURE REPORT  PATIENT: Timberland, Zehrung  MR#: [id number] BIRTHDATE: 1930-02-25 , 83  yrs. old GENDER: Male ENDOSCOPIST: Gatha Mayer, MD, [facility] PROCEDURE DATE:  01/28/2013 PROCEDURE:   Colonoscopy with snare polypectomy First Screening Colonoscopy - Avg.  risk and is 50 yrs.  old or older - No.  Prior Negative Screening - Now for repeat screening. N/A  History of Adenoma - Now for follow-up colonoscopy & has been > or = to 3 yrs.  Yes hx of adenoma.  Has been 3 or more years since last colonoscopy.  Polyps Removed Today? Yes. ASA CLASS:   Class III INDICATIONS:Patient's personal history of adenomatous colon polyps and Change in bowel habits. MEDICATIONS: Propofol (Diprivan) 260 mg IV, MAC sedation, administered by CRNA, and These medications were titrated to patient response per physician's verbal order  DESCRIPTION OF PROCEDURE:   After the risks benefits and alternatives of the procedure were thoroughly explained, informed consent was obtained.  A digital rectal exam revealed no abnormalities of the rectum, A digital rectal exam revealed no prostatic nodules, and A digital rectal exam revealed the prostate was not enlarged.   The LB [id number]  endoscope was introduced through the anus and advanced to the cecum, which was identified by both the appendix and ileocecal valve. No adverse events experienced.   The quality of the prep was Suprep adequate The instrument was then slowly withdrawn as the colon was fully examined.   COLON FINDINGS: Six sessile polyps measuring 3-12 mm in size were found at the cecum (1-28mm), in the ascending colon (1-12 mm), at the hepatic flexure (2 diminutive), and in the transverse colon (2 diminutive).  A polypectomy was performed with a cold snare and using snare cautery.  The resection was complete and the polyp tissue was completely retrieved.   Severe  diverticulosis was noted in the sigmoid colon.   The colon mucosa was otherwise normal. Retroflexed views revealed internal hemorrhoids. The time to cecum=1 minutes 31 seconds.  Withdrawal time=24 minutes 16 seconds. The scope was withdrawn and the procedure completed. COMPLICATIONS: There were no complications.  ENDOSCOPIC IMPRESSION: 1.   Six sessile polyps measuring 3-12 mm in size were found at the cecum, in the ascending colon, at the hepatic flexure, and in the transverse colon; polypectomy was performed with a cold snare and using snare cautery 2.   Severe diverticulosis was noted in the sigmoid colon 3.   The colon mucosa was otherwise normal  RECOMMENDATIONS: Hold aspirin, aspirin products, and anti-inflammatory medication for 2 weeks. try benefiber 1 tablesppon daily for looser stools  eSigned:  Gatha Mayer, MD, [facility] 01/28/2013 9:23 AM cc: The Patient and Hollace Kinnier, DO

## 2013-01-28 NOTE — Progress Notes (Signed)
Called to room to assist during endoscopic procedure.  Patient ID and intended procedure confirmed with present staff. Received instructions for my participation in the procedure from the performing physician.  

## 2013-01-29 ENCOUNTER — Telehealth: Payer: Self-pay | Admitting: *Deleted

## 2013-01-29 NOTE — Telephone Encounter (Signed)
  Follow up Call-  Call back number 01/28/2013  Post procedure Call Back phone  # [phone]  Permission to leave phone message Yes     Patient questions:  Do you have a fever, pain , or abdominal swelling? no Pain Score  0 *  Have you tolerated food without any problems? yes  Have you been able to return to your normal activities? yes  Do you have any questions about your discharge instructions: Diet   no Medications  no Follow up visit  no  Do you have questions or concerns about your Care? no  Actions: * If pain score is 4 or above: No action needed, pain <4.

## 2013-02-04 ENCOUNTER — Encounter: Payer: Self-pay | Admitting: Internal Medicine

## 2013-02-04 NOTE — Progress Notes (Signed)
Quick Note:  4-5 sessile serrated polyps and tubular adenomas max 12 mm Consider repeating colon 2017 but age may preclude ______

## 2013-03-10 ENCOUNTER — Other Ambulatory Visit: Payer: Self-pay | Admitting: *Deleted

## 2013-03-10 DIAGNOSIS — E1129 Type 2 diabetes mellitus with other diabetic kidney complication: Secondary | ICD-10-CM

## 2013-03-10 MED ORDER — INSULIN LISPRO 100 UNIT/ML (KWIKPEN): PEN_INJECTOR | SUBCUTANEOUS | Status: DC

## 2013-04-07 ENCOUNTER — Ambulatory Visit (INDEPENDENT_AMBULATORY_CARE_PROVIDER_SITE_OTHER): Payer: Medicare HMO | Admitting: Internal Medicine

## 2013-04-07 ENCOUNTER — Ambulatory Visit
Admission: RE | Admit: 2013-04-07 | Discharge: 2013-04-07 | Disposition: A | Discharge status: Home / Self Care | Payer: Medicare HMO | Source: Ambulatory Visit | Attending: Internal Medicine | Admitting: Internal Medicine

## 2013-04-07 ENCOUNTER — Encounter: Payer: Self-pay | Admitting: Internal Medicine

## 2013-04-07 ENCOUNTER — Telehealth: Payer: Self-pay | Admitting: *Deleted

## 2013-04-07 VITALS — BP 154/78 | HR 61 | Temp 97.9°F | Wt 163.0 lb

## 2013-04-07 DIAGNOSIS — J209 Acute bronchitis, unspecified: Secondary | ICD-10-CM

## 2013-04-07 MED ORDER — AZITHROMYCIN 250 MG PO TABS: ORAL_TABLET | ORAL | Status: DC

## 2013-04-07 NOTE — Patient Instructions (Signed)
Please get xray of your chest, blood tests and we will call you with the results.

## 2013-04-07 NOTE — Telephone Encounter (Signed)
Pt notified VIA phone and RX resent to [facility] per pt's request.

## 2013-04-07 NOTE — Progress Notes (Signed)
Patient ID: Wesley Garcia, male   DOB: May 10, 1929, 80 y.o.   MRN: [id number]   Location:  [facility] [place]  No Known Allergies  Chief Complaint  Patient presents with  . Acute Visit    chest tightness, coughing up phlegm (clear), HA's, sinus pressure/drainage x 3 weeks.  he has taken OTC Musinex, Sinus & Cold, Benadryl with little relief.    HPI: Patient is a 80 y.o. white male seen in the office today for acute visit.  This is the beginning of the 3rd week.  Could not cure it himself.  Wife also had, but not as bad.  First 2 days, he carried a trash can b/c he had stomach virus.  Sinus congestion, headache, nose running, chest tight.  Clear sputum.   A little yellow once in a while.  Felt feverish this morning.  Afebrile here in the office.  Comes and goes.  Appetite fairly good.  Just eats less overall than he used to.  Wife says he eats all right--likes his sweets.  Sometimes has donuts for breakfast.    Review of Systems:  Review of Systems  Constitutional: Positive for fever, chills and malaise/fatigue.  HENT: Positive for congestion. Negative for sore throat.   Eyes: Negative for blurred vision.  Respiratory: Positive for cough, sputum production and shortness of breath.   Cardiovascular: Positive for chest pain.  Gastrointestinal: Positive for nausea, vomiting and diarrhea.  Genitourinary: Negative for dysuria, urgency and frequency.  Musculoskeletal: Positive for myalgias. Negative for falls.  Skin: Negative for rash.  Neurological: Positive for weakness. Negative for dizziness and loss of consciousness.  Psychiatric/Behavioral: Negative for depression.     Past Medical History  Diagnosis Date  . Diabetes mellitus   . Chronic kidney disease (CKD), stage III (moderate)   . Squamous cell carcinoma of hard palate 2003  . Diverticulosis   . Hemorrhoids   . Hx of adenomatous colonic polyps 2006    Past Surgical History  Procedure  Laterality Date  . Maxillectomy Right 2003    SCCA alveolar ridge  . Colonoscopy  11/02/2007    Social History:   reports that he quit smoking about 13 years ago. His smoking use included Cigars. He has never used smokeless tobacco. He reports that he drinks alcohol. He reports that he does not use illicit drugs.  Family History  Problem Relation Age of Onset  . Cancer Mother     ?  Marland Kitchen Bone cancer Sister   . Diabetes Brother   . Kidney disease Sister   . Colon cancer Neg Hx     Medications: Patient's Medications  New Prescriptions   No medications on file  Previous Medications   AMLODIPINE (NORVASC) 2.5 MG TABLET    Take 1 tablet (2.5 mg total) by mouth daily.   INSULIN LISPRO (HUMALOG KWIKPEN) 100 UNIT/ML KIWKPEN    Inject 4 units with breakfast, 4 units lunch, and 7 units at supper   INSULIN PEN NEEDLE 31G X 5 MM MISC    Use daily as directed to inject insulin to control blood sugar.   LANTUS SOLOSTAR 100 UNIT/ML SOPN    Inject 18 Units into the skin at bedtime.  Modified Medications   No medications on file  Discontinued Medications   No medications on file     Physical Exam: Filed Vitals:   04/07/13 1147  BP: 154/78  Pulse: 61  Temp: 97.9 F (36.6 C)  TempSrc: Oral  Weight: 163  lb (73.936 kg)  SpO2: 99%  Physical Exam  Constitutional: He is oriented to person, place, and time. He appears well-developed and well-nourished. No distress.  HENT:  Head: Normocephalic and atraumatic.  Cardiovascular: Normal rate, regular rhythm, normal heart sounds and intact distal pulses.   Pulmonary/Chest: Effort normal.  rhonchi  Abdominal: Soft. Bowel sounds are normal. He exhibits no distension and no mass. There is no tenderness.  Musculoskeletal: Normal range of motion.  Neurological: He is alert and oriented to person, place, and time.  Skin: Skin is warm and dry.    Labs reviewed: Basic Metabolic Panel:  Recent Labs  06/05/12 0824 09/16/12 0921 01/10/13 0808  NA  142 141 141  K 4.2 4.2 4.5  CL 103 102 101  CO2 23 24 27   GLUCOSE 93 158* 104*  BUN 23 27 20   CREATININE 2.28* 2.20* 2.08*  CALCIUM 9.7 9.7 9.4   Liver Function Tests:  Recent Labs  06/05/12 0824 09/16/12 0921 01/10/13 0808  AST 18 19 17   ALT 14 16 12   ALKPHOS 65 79 68  BILITOT 0.8 0.4 0.7  PROT 6.6 6.8 6.6   No results found for this basename: LIPASE, AMYLASE,  in the last 8760 hours No results found for this basename: AMMONIA,  in the last 8760 hours CBC: No results found for this basename: WBC, NEUTROABS, HGB, HCT, MCV, PLT,  in the last 8760 hours Lipid Panel:  Recent Labs  06/05/12 0824 01/10/13 0808  HDL 58 59  LDLCALC 29 24  TRIG 47 29  CHOLHDL 1.7 1.5   Lab Results  Component Value Date   HGBA1C 6.7* 01/10/2013   Assessment/Plan  1. Acute bronchitis -due to persistence and remaining symptoms, will check cxr and labs: - DG Chest 2 View; Future - CBC with Differential - Basic metabolic panel -depending on results, may prescribe abx if needed, otherwise, conservative measures were discussed in detail  Labs/tests ordered:  Orders Placed This Encounter  Procedures  . DG Chest 2 View    Standing Status: Future     Number of Occurrences: 1     Standing Expiration Date: 06/06/2014    Order Specific Question:  Reason for Exam (SYMPTOM  OR DIAGNOSIS REQUIRED)    Answer:  right-sided rhonchi and wheezing, chest tightness anteriorly    Order Specific Question:  Preferred imaging location?    Answer:  [facility]  . CBC with Differential  . Basic metabolic panel  . HM COLONOSCOPY    This external order was created through the Results Console.    Next appt:  Keep regular appt

## 2013-04-08 LAB — CBC WITH DIFFERENTIAL/PLATELET
Basophils Absolute: 0 10*3/uL (ref 0.0–0.2)
Basos: 1 %
Eos: 2 %
Eosinophils Absolute: 0.2 10*3/uL (ref 0.0–0.4)
HCT: 38.7 % (ref 37.5–51.0)
Hemoglobin: 13.4 g/dL (ref 12.6–17.7)
Immature Grans (Abs): 0 10*3/uL (ref 0.0–0.1)
Immature Granulocytes: 0 %
Lymphocytes Absolute: 1.6 10*3/uL (ref 0.7–3.1)
Lymphs: 21 %
MCH: 31.2 pg (ref 26.6–33.0)
MCHC: 34.6 g/dL (ref 31.5–35.7)
MCV: 90 fL (ref 79–97)
Monocytes Absolute: 0.8 10*3/uL (ref 0.1–0.9)
Monocytes: 10 %
Neutrophils Absolute: 4.9 10*3/uL (ref 1.4–7.0)
Neutrophils Relative %: 66 %
RBC: 4.29 x10E6/uL (ref 4.14–5.80)
RDW: 13.3 % (ref 12.3–15.4)
WBC: 7.5 10*3/uL (ref 3.4–10.8)

## 2013-04-08 LAB — BASIC METABOLIC PANEL
BUN/Creatinine Ratio: 10 (ref 10–22)
BUN: 21 mg/dL (ref 8–27)
CO2: 23 mmol/L (ref 18–29)
Calcium: 9.2 mg/dL (ref 8.6–10.2)
Chloride: 99 mmol/L (ref 97–108)
Creatinine, Ser: 2.02 mg/dL — ABNORMAL HIGH (ref 0.76–1.27)
GFR calc Af Amer: 35 mL/min/{1.73_m2} — ABNORMAL LOW (ref >59)
GFR calc non Af Amer: 30 mL/min/{1.73_m2} — ABNORMAL LOW (ref >59)
Glucose: 206 mg/dL — ABNORMAL HIGH (ref 65–99)
Potassium: 4.3 mmol/L (ref 3.5–5.2)
Sodium: 139 mmol/L (ref 134–144)

## 2013-04-14 ENCOUNTER — Other Ambulatory Visit: Payer: Self-pay | Admitting: Dermatology

## 2013-04-28 ENCOUNTER — Encounter: Payer: Self-pay | Admitting: Internal Medicine

## 2013-04-28 ENCOUNTER — Ambulatory Visit (INDEPENDENT_AMBULATORY_CARE_PROVIDER_SITE_OTHER): Payer: Medicare HMO | Admitting: Internal Medicine

## 2013-04-28 VITALS — BP 140/86 | HR 76 | Temp 97.6°F | Resp 16 | Wt 161.6 lb

## 2013-04-28 DIAGNOSIS — F341 Dysthymic disorder: Secondary | ICD-10-CM

## 2013-04-28 DIAGNOSIS — F418 Other specified anxiety disorders: Secondary | ICD-10-CM

## 2013-04-28 DIAGNOSIS — I10 Essential (primary) hypertension: Secondary | ICD-10-CM

## 2013-04-28 MED ORDER — SERTRALINE HCL 25 MG PO TABS: 25.0000 mg | ORAL_TABLET | Freq: Every day | ORAL | Status: DC

## 2013-04-28 MED ORDER — AMLODIPINE BESYLATE 2.5 MG PO TABS: 2.5000 mg | ORAL_TABLET | Freq: Every day | ORAL | Status: DC

## 2013-04-28 NOTE — Progress Notes (Signed)
Patient ID: Wesley Garcia, male   DOB: 1929/04/04, 80 y.o.   MRN: [id number]   Location:  [facility]  Code Status: DNR, has living will   No Known Allergies  Chief Complaint  Patient presents with  . Acute Visit    having nerve issues, wants to f/u on last labs (anemia)    HPI: Patient is a 80 y.o.  seen in the office today for acute visit due to anxiety and depression and to review his labs.  Since had the flu, has been tremulous--handwriting like a doctor's.  Feels nervous--probably been depressed.  Has only played cards on the computer and reading book.  Fidgety.  Anything to pass the time away.  Had a period of time where not sleeping at night b/c of chest cold.  Appetite is good, but eats less than he used to--more easily full.  Weight fairly stable (has come down while sick).  A couple of family members with cancer.  Stress about house being on the market.  Cannot take care of 2.4 acres anymore.    Also having headaches--not sure if sinus--in frontal area.  BP high for him 140/86--lower at home.  Needs refill on the half pill.    Was anemic previously but blood counts ok this last time.    Review of Systems:  Review of Systems  Constitutional: Positive for malaise/fatigue. Negative for fever.  HENT: Positive for congestion.   Eyes: Negative for blurred vision.  Respiratory: Negative for shortness of breath.   Cardiovascular: Negative for chest pain.  Gastrointestinal: Negative for constipation.  Genitourinary: Negative for dysuria.  Musculoskeletal: Negative for falls and myalgias.  Skin: Negative for rash.  Neurological: Positive for weakness. Negative for dizziness and headaches.  Psychiatric/Behavioral: Positive for depression. Negative for suicidal ideas, hallucinations and memory loss. The patient is nervous/anxious. The patient does not have insomnia.      Past Medical History  Diagnosis Date  . Diabetes mellitus   . Chronic  kidney disease (CKD), stage III (moderate)   . Squamous cell carcinoma of hard palate 2003  . Diverticulosis   . Hemorrhoids   . Hx of adenomatous colonic polyps 2006    Past Surgical History  Procedure Laterality Date  . Maxillectomy Right 2003    SCCA alveolar ridge  . Colonoscopy  11/02/2007    Social History:   reports that he quit smoking about 13 years ago. His smoking use included Cigars. He has never used smokeless tobacco. He reports that he drinks alcohol. He reports that he does not use illicit drugs.  Family History  Problem Relation Age of Onset  . Cancer Mother     ?  Marland Kitchen Bone cancer Sister   . Diabetes Brother   . Kidney disease Sister   . Colon cancer Neg Hx     Medications: Patient's Medications  New Prescriptions   No medications on file  Previous Medications   AMLODIPINE (NORVASC) 2.5 MG TABLET    Take 1 tablet (2.5 mg total) by mouth daily.   INSULIN LISPRO (HUMALOG KWIKPEN) 100 UNIT/ML KIWKPEN    Inject 4 units with breakfast, 4 units lunch, and 7 units at supper   INSULIN PEN NEEDLE 31G X 5 MM MISC    Use daily as directed to inject insulin to control blood sugar.   LANTUS SOLOSTAR 100 UNIT/ML SOPN    Inject 18 Units into the skin at bedtime.  Modified Medications   No medications on  file  Discontinued Medications   AZITHROMYCIN (ZITHROMAX) 250 MG TABLET    2 tabs today and one tab daily for 4 days thereafter     Physical Exam: Filed Vitals:   04/28/13 1130  BP: 140/86  Pulse: 76  Temp: 97.6 F (36.4 C)  TempSrc: Oral  Resp: 16  Weight: 161 lb 9.6 oz (73.301 kg)  SpO2: 93%  Physical Exam  Constitutional: He is oriented to person, place, and time. No distress.  White male  Cardiovascular: Normal rate, regular rhythm, normal heart sounds and intact distal pulses.   Pulmonary/Chest: Effort normal and breath sounds normal. No respiratory distress.  Abdominal: Soft. Bowel sounds are normal. He exhibits no distension and no mass. There is no  tenderness.  Musculoskeletal: Normal range of motion.  Walks w/o assistive device  Neurological: He is alert and oriented to person, place, and time.  Skin: Skin is warm and dry.  Psychiatric:  Appears unhappy, bordering on tears at times    Labs reviewed: Basic Metabolic Panel:  Recent Labs  09/16/12 0921 01/10/13 0808 04/07/13 1232  NA 141 141 139  K 4.2 4.5 4.3  CL 102 101 99  CO2 24 27 23   GLUCOSE 158* 104* 206*  BUN 27 20 21   CREATININE 2.20* 2.08* 2.02*  CALCIUM 9.7 9.4 9.2   Liver Function Tests:  Recent Labs  06/05/12 0824 09/16/12 0921 01/10/13 0808  AST 18 19 17   ALT 14 16 12   ALKPHOS 65 79 68  BILITOT 0.8 0.4 0.7  PROT 6.6 6.8 6.6  CBC:  Recent Labs  04/07/13 1232  WBC 7.5  NEUTROABS 4.9  HGB 13.4  HCT 38.7  MCV 90   Lipid Panel:  Recent Labs  06/05/12 0824 01/10/13 0808  HDL 58 59  LDLCALC 29 24  TRIG 47 29  CHOLHDL 1.7 1.5   Lab Results  Component Value Date   HGBA1C 6.7* 01/10/2013    Assessment/Plan 1. Depression with anxiety - will try zoloft for this and see how he does - sertraline (ZOLOFT) 25 MG tablet; Take 1 tablet (25 mg total) by mouth daily.  Dispense: 90 tablet; Refill: 1  2. Essential hypertension, benign - bp has been up when out of norvasc so resumed - amLODipine (NORVASC) 2.5 MG tablet; Take 1 tablet (2.5 mg total) by mouth daily.  Dispense: 90 tablet; Refill: 1  Next appt: keep as scheduled

## 2013-05-06 ENCOUNTER — Other Ambulatory Visit: Payer: Self-pay | Admitting: *Deleted

## 2013-05-06 MED ORDER — GLUCOSE BLOOD VI STRP: ORAL_STRIP | Status: DC

## 2013-05-09 ENCOUNTER — Other Ambulatory Visit: Payer: Medicare HMO

## 2013-05-09 DIAGNOSIS — E1129 Type 2 diabetes mellitus with other diabetic kidney complication: Secondary | ICD-10-CM

## 2013-05-10 LAB — COMPREHENSIVE METABOLIC PANEL
ALT: 15 IU/L (ref 0–44)
AST: 18 IU/L (ref 0–40)
Albumin/Globulin Ratio: 1.6 (ref 1.1–2.5)
Albumin: 4.2 g/dL (ref 3.5–4.7)
Alkaline Phosphatase: 61 IU/L (ref 39–117)
BUN/Creatinine Ratio: 11 (ref 10–22)
BUN: 22 mg/dL (ref 8–27)
CO2: 22 mmol/L (ref 18–29)
Calcium: 9.5 mg/dL (ref 8.6–10.2)
Chloride: 103 mmol/L (ref 97–108)
Creatinine, Ser: 1.97 mg/dL — ABNORMAL HIGH (ref 0.76–1.27)
GFR calc Af Amer: 36 mL/min/{1.73_m2} — ABNORMAL LOW (ref >59)
GFR calc non Af Amer: 31 mL/min/{1.73_m2} — ABNORMAL LOW (ref >59)
Globulin, Total: 2.7 g/dL (ref 1.5–4.5)
Glucose: 90 mg/dL (ref 65–99)
Potassium: 4.2 mmol/L (ref 3.5–5.2)
Sodium: 142 mmol/L (ref 134–144)
Total Bilirubin: 0.8 mg/dL (ref 0.0–1.2)
Total Protein: 6.9 g/dL (ref 6.0–8.5)

## 2013-05-10 LAB — HEMOGLOBIN A1C
Est. average glucose Bld gHb Est-mCnc: 166 mg/dL
Hgb A1c MFr Bld: 7.4 % — ABNORMAL HIGH (ref 4.8–5.6)

## 2013-05-12 ENCOUNTER — Ambulatory Visit (INDEPENDENT_AMBULATORY_CARE_PROVIDER_SITE_OTHER): Payer: Medicare HMO | Admitting: Pharmacotherapy

## 2013-05-12 ENCOUNTER — Encounter: Payer: Self-pay | Admitting: Pharmacotherapy

## 2013-05-12 VITALS — BP 122/78 | HR 55 | Resp 10 | Wt 163.8 lb

## 2013-05-12 DIAGNOSIS — E1129 Type 2 diabetes mellitus with other diabetic kidney complication: Secondary | ICD-10-CM

## 2013-05-12 DIAGNOSIS — I10 Essential (primary) hypertension: Secondary | ICD-10-CM

## 2013-05-12 MED ORDER — INSULIN DETEMIR 100 UNIT/ML FLEXPEN: 20.0000 [IU] | PEN_INJECTOR | Freq: Every day | SUBCUTANEOUS | Status: DC

## 2013-05-12 MED ORDER — INSULIN ASPART 100 UNIT/ML FLEXPEN: PEN_INJECTOR | SUBCUTANEOUS | Status: DC

## 2013-05-12 NOTE — Progress Notes (Signed)
  Subjective:    Wesley Garcia is a 80 y.o.white male who presents for follow-up of Type 2 diabetes mellitus.   A1C is up to 7.4% [employer] wants him to change Humalog to Novlolog. Has not been able to check BG for 2 weeks.  [facility] won't fill the strips until the end of March.  Don't know why. He has been complaining of lows.  He has been running out of Lantus.  [place] will want him changed to Levemir. Denies problems with feet. Vision is not as good as before.  Doesn't require glasses. Continues to dance for exercise. Trying to eat healthy. Nocturia at least once per night. Denies peripheral edema.  Review of Systems A comprehensive review of systems was negative except for: Eyes: positive for contacts/glasses Genitourinary: positive for nocturia    Objective:    BP 122/78  Pulse 55  Resp 10  Wt 163 lb 12.8 oz (74.299 kg)  SpO2 92%  General:  alert, cooperative and no distress  Oropharynx: normal findings: lips normal without lesions and gums healthy   Eyes:  negative findings: lids and lashes normal and conjunctivae and sclerae normal   Ears:  external ears normal        Lung: clear to auscultation bilaterally  Heart:  regular rate and rhythm     Extremities: no edema  Skin: warm and dry, no hyperpigmentation, vitiligo, or suspicious lesions     Neuro: mental status, speech normal, alert and oriented x3 and gait and station normal   Lab Review Glucose (mg/dL)  Date Value  05/09/2013 90   04/07/2013 206*  01/10/2013 104*     CO2 (mmol/L)  Date Value  05/09/2013 22   04/07/2013 23   01/10/2013 27      BUN (mg/dL)  Date Value  05/09/2013 22   04/07/2013 21   01/10/2013 20      Creatinine, Ser (mg/dL)  Date Value  05/09/2013 1.97*  04/07/2013 2.02*  01/10/2013 2.08*    05/09/13: A1C:  7.4% AST:  18 ALT:  15  Assessment:    Diabetes Mellitus type II, under good control.  A1C higher, but still OK.   Plan:    1.  Rx changes: Increase Levemir 20 units daily. 2.  Will  change Lantus to Levemir per insurance formulary. 3.  Will change Humalog to Novolog per insurance formulary. 4.  Suspect higher A1C a result of insurance limiting access to medication. 5.  Reviewed nutrition goals. 6.  Continue dancing for exercise.

## 2013-05-12 NOTE — Patient Instructions (Signed)
Will change Lantus to Levemir.  Take 20 units once daily. Will change Humalog to Novolog.  Take 4 units with breakfast & lunch, 7 units at supper

## 2013-08-04 ENCOUNTER — Other Ambulatory Visit: Payer: Self-pay | Admitting: Internal Medicine

## 2013-09-01 ENCOUNTER — Ambulatory Visit: Payer: Medicare HMO | Admitting: *Deleted

## 2013-09-01 ENCOUNTER — Other Ambulatory Visit: Payer: Self-pay | Admitting: Internal Medicine

## 2013-09-01 VITALS — Temp 97.4°F

## 2013-09-01 DIAGNOSIS — Z23 Encounter for immunization: Secondary | ICD-10-CM

## 2013-09-01 MED ORDER — PNEUMOCOCCAL 13-VAL CONJ VACC IM SUSP: 0.5000 mL | Freq: Once | INTRAMUSCULAR | Status: DC

## 2013-09-11 ENCOUNTER — Other Ambulatory Visit: Payer: Medicare HMO

## 2013-09-11 DIAGNOSIS — E1129 Type 2 diabetes mellitus with other diabetic kidney complication: Secondary | ICD-10-CM

## 2013-09-12 LAB — COMPREHENSIVE METABOLIC PANEL
ALT: 13 IU/L (ref 0–44)
AST: 20 IU/L (ref 0–40)
Albumin/Globulin Ratio: 1.8 (ref 1.1–2.5)
Albumin: 4.1 g/dL (ref 3.5–4.7)
Alkaline Phosphatase: 60 IU/L (ref 39–117)
BUN/Creatinine Ratio: 11 (ref 10–22)
BUN: 24 mg/dL (ref 8–27)
CO2: 23 mmol/L (ref 18–29)
Calcium: 9.7 mg/dL (ref 8.6–10.2)
Chloride: 102 mmol/L (ref 97–108)
Creatinine, Ser: 2.12 mg/dL — ABNORMAL HIGH (ref 0.76–1.27)
GFR calc Af Amer: 33 mL/min/{1.73_m2} — ABNORMAL LOW (ref >59)
GFR calc non Af Amer: 28 mL/min/{1.73_m2} — ABNORMAL LOW (ref >59)
Globulin, Total: 2.3 g/dL (ref 1.5–4.5)
Glucose: 83 mg/dL (ref 65–99)
Potassium: 3.9 mmol/L (ref 3.5–5.2)
Sodium: 139 mmol/L (ref 134–144)
Total Bilirubin: 0.6 mg/dL (ref 0.0–1.2)
Total Protein: 6.4 g/dL (ref 6.0–8.5)

## 2013-09-12 LAB — LIPID PANEL
Chol/HDL Ratio: 1.6 ratio units (ref 0.0–5.0)
Cholesterol, Total: 94 mg/dL — ABNORMAL LOW (ref 100–199)
HDL: 58 mg/dL (ref >39)
LDL Calculated: 30 mg/dL (ref 0–99)
Triglycerides: 31 mg/dL (ref 0–149)
VLDL Cholesterol Cal: 6 mg/dL (ref 5–40)

## 2013-09-12 LAB — HEMOGLOBIN A1C
Est. average glucose Bld gHb Est-mCnc: 151 mg/dL
Hgb A1c MFr Bld: 6.9 % — ABNORMAL HIGH (ref 4.8–5.6)

## 2013-09-15 ENCOUNTER — Ambulatory Visit (INDEPENDENT_AMBULATORY_CARE_PROVIDER_SITE_OTHER): Payer: Medicare HMO | Admitting: Pharmacotherapy

## 2013-09-15 ENCOUNTER — Encounter: Payer: Self-pay | Admitting: Pharmacotherapy

## 2013-09-15 VITALS — BP 118/70 | HR 57 | Resp 10 | Wt 159.0 lb

## 2013-09-15 DIAGNOSIS — I10 Essential (primary) hypertension: Secondary | ICD-10-CM

## 2013-09-15 DIAGNOSIS — E785 Hyperlipidemia, unspecified: Secondary | ICD-10-CM

## 2013-09-15 DIAGNOSIS — E1129 Type 2 diabetes mellitus with other diabetic kidney complication: Secondary | ICD-10-CM

## 2013-09-15 NOTE — Patient Instructions (Signed)
Keep up the good work! Goals for next time: 1.  Keep A1C <7% 2.  Keep dancing

## 2013-09-15 NOTE — Progress Notes (Signed)
  Subjective:    Wesley Garcia is a 80 y.o.white male who presents for follow-up of Type 2 diabetes mellitus.   A1C improved to <7% BG log:  126-158 (high 315 x 1, low 67)  He tries to eat healthy.  He actually enjoys when he drops too low so he can eat sweets. He has had family visiting - the last couple of weeks have been filled with fried and fast food. Continues to dance for exercise. Denies problems with feet. Denies problems with vision.  Does wear glasses. Nocturia at least 2-3 times per night.  He does take a diuretic. Denies peripheral edema.  Continue Levemir and Novolog without problems.   Review of Systems A comprehensive review of systems was negative except for: Eyes: positive for contacts/glasses Genitourinary: positive for nocturia    Objective:    BP 118/70  Pulse 57  Resp 10  Wt 159 lb (72.122 kg)  SpO2 97%  General:  alert, cooperative and no distress  Oropharynx: normal findings: lips normal without lesions and gums healthy   Eyes:  negative findings: lids and lashes normal and conjunctivae and sclerae normal   Ears:  external ears normal        Lung: clear to auscultation bilaterally  Heart:  regular rate and rhythm     Extremities: no edema noted  Skin: warm and dry, no hyperpigmentation, vitiligo, or suspicious lesions     Neuro: mental status, speech normal, alert and oriented x3 and gait and station normal   Lab Review Glucose (mg/dL)  Date Value  09/11/2013 83   05/09/2013 90   04/07/2013 206*     CO2 (mmol/L)  Date Value  09/11/2013 23   05/09/2013 22   04/07/2013 23      BUN (mg/dL)  Date Value  09/11/2013 24   05/09/2013 22   04/07/2013 21      Creatinine, Ser (mg/dL)  Date Value  09/11/2013 2.12*  05/09/2013 1.97*  04/07/2013 2.02*    09/11/13: A1C:  6.9% AST:  20 ALT:  13 Total cholesterol:  94 Triglycerides:  31 HDL:  58 LDL:  30  Assessment:    Diabetes Mellitus type II, under excellent control.  BP at goal <140/90 LDL at goal  <100   Plan:    1.  Rx changes: none 2.  Continue Levemir 20 units daily. 3.  Continue Novolog 4 units breakfast and lunch, 7 units supper. 4.  Counseled on nutrition goals. 5.  Praised exercise efforts - dancing at least twice a week. 6.  Counseled on foot care. 7.  BP at goal <140/90.  Continue amlodipine. 8.  Lipids at goal without medication. 9.  RTC in 4 months.

## 2013-12-01 ENCOUNTER — Other Ambulatory Visit: Payer: Self-pay | Admitting: *Deleted

## 2013-12-03 ENCOUNTER — Other Ambulatory Visit: Payer: Self-pay | Admitting: *Deleted

## 2013-12-03 MED ORDER — AMBULATORY NON FORMULARY MEDICATION: Status: DC

## 2013-12-04 ENCOUNTER — Encounter: Payer: Self-pay | Admitting: Internal Medicine

## 2013-12-04 ENCOUNTER — Ambulatory Visit (INDEPENDENT_AMBULATORY_CARE_PROVIDER_SITE_OTHER): Payer: Medicare HMO | Admitting: Internal Medicine

## 2013-12-04 VITALS — BP 158/80 | HR 65 | Temp 97.2°F | Resp 10 | Ht 66.0 in | Wt 165.0 lb

## 2013-12-04 DIAGNOSIS — Z23 Encounter for immunization: Secondary | ICD-10-CM

## 2013-12-04 DIAGNOSIS — R1033 Periumbilical pain: Secondary | ICD-10-CM

## 2013-12-04 DIAGNOSIS — R29818 Other symptoms and signs involving the nervous system: Secondary | ICD-10-CM

## 2013-12-04 DIAGNOSIS — R110 Nausea: Secondary | ICD-10-CM

## 2013-12-04 DIAGNOSIS — R2689 Other abnormalities of gait and mobility: Secondary | ICD-10-CM

## 2013-12-04 DIAGNOSIS — I10 Essential (primary) hypertension: Secondary | ICD-10-CM

## 2013-12-04 DIAGNOSIS — F418 Other specified anxiety disorders: Secondary | ICD-10-CM

## 2013-12-04 DIAGNOSIS — N184 Chronic kidney disease, stage 4 (severe): Secondary | ICD-10-CM

## 2013-12-04 MED ORDER — SERTRALINE HCL 25 MG PO TABS: 25.0000 mg | ORAL_TABLET | Freq: Every day | ORAL | Status: DC

## 2013-12-04 NOTE — Progress Notes (Signed)
Patient ID: Wesley Garcia, male   DOB: Oct 27, 1929, 80 y.o.   MRN: [id number]   Location:  [facility]  Code Status: DNR  No Known Allergies  Chief Complaint  Patient presents with  . Acute Visit    Sick on stomach x 1 week     HPI: Patient is a 80 y.o. white male seen in the office today for acute visit.  Has been nauseated for a week.  Never did vomit.  Even felt like he could at church last night.  Has had some "running off".  Has been every other day.  No chills or feeling feverish.  Wakes up in the night, abdomen hurts, can't get comfortable.  Turns side to side and still hurts.  Feels best lying flat on his back.  Pain is across the center of his abdomen.    Took his last anxiety pill (xanax Dr. Sabra Heck gave him years ago).  Feels depressed sometimes.  Is bored.  His wife says he needs something for his mood.  Glucose has been running good.  120 this am.  His wife is keeping track.  Review of Systems:  Review of Systems  Constitutional: Negative for fever.  HENT: Positive for hearing loss. Negative for congestion.        Hearing aides not working and getting replaced  Respiratory: Negative for shortness of breath.   Cardiovascular: Negative for chest pain.  Gastrointestinal: Positive for heartburn, nausea, abdominal pain and diarrhea. Negative for vomiting, constipation, blood in stool and melena.  Genitourinary: Negative for dysuria, urgency and frequency.  Musculoskeletal: Negative for falls.  Skin: Negative for rash.  Neurological:       Balance poor  Endo/Heme/Allergies:       Diabetes  Psychiatric/Behavioral: Positive for depression. The patient is nervous/anxious.        Has taken xanax and seems more confused this am     Past Medical History  Diagnosis Date  . Diabetes mellitus   . Chronic kidney disease (CKD), stage III (moderate)   . Squamous cell carcinoma of hard palate 2003  . Diverticulosis   . Hemorrhoids   . Hx  of adenomatous colonic polyps 2006    Past Surgical History  Procedure Laterality Date  . Maxillectomy Right 2003    SCCA alveolar ridge  . Colonoscopy  11/02/2007    Social History:   reports that he quit smoking about 13 years ago. His smoking use included Cigars. He has never used smokeless tobacco. He reports that he drinks alcohol. He reports that he does not use illicit drugs.  Family History  Problem Relation Age of Onset  . Cancer Mother     ?  Marland Kitchen Bone cancer Sister   . Diabetes Brother   . Kidney disease Sister   . Colon cancer Neg Hx     Medications: Patient's Medications  New Prescriptions   No medications on file  Previous Medications   AMBULATORY NON FORMULARY MEDICATION    Sure Comfort Pen Needles 31Gx3/16" Use as Directed to inject insulin to control blood sugar Dx 250.00   AMLODIPINE (NORVASC) 2.5 MG TABLET    Take 1 tablet by mouth daily   GLUCOSE BLOOD TEST STRIP    One Touch Ultra test strip used to test blood sugar three times daily. Dx: 250.00   INSULIN ASPART (NOVOLOG FLEXPEN) 100 UNIT/ML FLEXPEN    Inject 4 units with breakfast, lunch and 7 units with supper   INSULIN  DETEMIR (LEVEMIR FLEXTOUCH) 100 UNIT/ML PEN    Inject 20 Units into the skin daily.  Modified Medications   No medications on file  Discontinued Medications   INSULIN PEN NEEDLE 31G X 5 MM MISC    Use daily as directed to inject insulin to control blood sugar.     Physical Exam: Filed Vitals:   12/04/13 0735  BP: 158/80  Pulse: 65  Temp: 97.2 F (36.2 C)  TempSrc: Oral  Resp: 10  Height: 5\' 6"  (1.676 m)  Weight: 165 lb (74.844 kg)  SpO2: 92%  Physical Exam  Constitutional: He is oriented to person, place, and time. He appears well-developed and well-nourished. No distress.  Cardiovascular: Normal rate, regular rhythm, normal heart sounds and intact distal pulses.   Pulmonary/Chest: Effort normal and breath sounds normal. No respiratory distress. He has no wheezes.  Abdominal:  Soft. Bowel sounds are normal. He exhibits no distension and no mass. There is no tenderness.  Musculoskeletal: Normal range of motion.  Neurological: He is alert and oriented to person, place, and time.  Balance poor as he walks into room and gets up on table  Skin: Skin is warm and dry.  Ecchymoses from insulin injections of abdomen on either side of umbilicus  Psychiatric:  Seems anxious    Labs reviewed: Basic Metabolic Panel:  Recent Labs  04/07/13 1232 05/09/13 0800 09/11/13 0807  NA 139 142 139  K 4.3 4.2 3.9  CL 99 103 102  CO2 23 22 23   GLUCOSE 206* 90 83  BUN 21 22 24   CREATININE 2.02* 1.97* 2.12*  CALCIUM 9.2 9.5 9.7   Liver Function Tests:  Recent Labs  01/10/13 0808 05/09/13 0800 09/11/13 0807  AST 17 18 20   ALT 12 15 13   ALKPHOS 68 61 60  BILITOT 0.7 0.8 0.6  PROT 6.6 6.9 6.4  CBC:  Recent Labs  04/07/13 1232  WBC 7.5  NEUTROABS 4.9  HGB 13.4  HCT 38.7  MCV 90   Lipid Panel:  Recent Labs  01/10/13 0808 09/11/13 0807  HDL 59 58  LDLCALC 24 30  TRIG 29 31  CHOLHDL 1.5 1.6   Lab Results  Component Value Date   HGBA1C 6.9* 09/11/2013    Assessment/Plan 1. Nausea in adult -suspect he's had gastroenteritis this week and is still recuperating and this is the most likely cause with his mix of symptoms; however will r/o pancreatitis, gallbladder etiology, as well as worsening renal function - CBC With differential/Platelet - Basic metabolic panel - Hepatic Function Panel - Lipase  2. Periumbilical abdominal pain - as above, also worsened by his insulin injections - CBC With differential/Platelet - Basic metabolic panel - Hepatic Function Panel - Lipase  3. Essential hypertension, benign - bp elevated, but c/o pain and very anxious - Basic metabolic panel  4. Balance problem -is getting therapy for this, advised not helped by the xanax he took  5. Depression with anxiety -will try zoloft 25mg  daily for 2 wks, then increase to  50mg  daily thereafter  6. Chronic kidney disease, stage 4 (severe) - reassess due to nausea, follows with Dr. [name] - Basic metabolic panel  7. Need for prophylactic vaccination and inoculation against influenza -flu shot given today  Labs/tests ordered:   Orders Placed This Encounter  Procedures  . CBC With differential/Platelet  . Basic metabolic panel  . Hepatic Function Panel  . Lipase    Next appt:  Keep regular visit  Wesley Garcia Dady L. Nicky Kras, D.O. Geriatrics  [facility] [address], [place] [address] Cell Phone (Mon-Fri 8am-5pm):  [phone] On Call:  [phone] & follow prompts after 5pm & weekends Office Phone:  [phone] Office Fax:  [phone]

## 2013-12-05 LAB — CBC WITH DIFFERENTIAL
Basophils Absolute: 0 10*3/uL (ref 0.0–0.2)
Basos: 1 %
Eos: 3 %
Eosinophils Absolute: 0.2 10*3/uL (ref 0.0–0.4)
HCT: 40.6 % (ref 37.5–51.0)
Hemoglobin: 13.8 g/dL (ref 12.6–17.7)
Immature Grans (Abs): 0 10*3/uL (ref 0.0–0.1)
Immature Granulocytes: 0 %
Lymphocytes Absolute: 1.3 10*3/uL (ref 0.7–3.1)
Lymphs: 21 %
MCH: 31.4 pg (ref 26.6–33.0)
MCHC: 34 g/dL (ref 31.5–35.7)
MCV: 93 fL (ref 79–97)
Monocytes Absolute: 0.7 10*3/uL (ref 0.1–0.9)
Monocytes: 12 %
Neutrophils Absolute: 4 10*3/uL (ref 1.4–7.0)
Neutrophils Relative %: 63 %
Platelets: 198 10*3/uL (ref 150–379)
RBC: 4.39 x10E6/uL (ref 4.14–5.80)
RDW: 13.7 % (ref 12.3–15.4)
WBC: 6.2 10*3/uL (ref 3.4–10.8)

## 2013-12-05 LAB — BASIC METABOLIC PANEL
BUN/Creatinine Ratio: 12 (ref 10–22)
BUN: 25 mg/dL (ref 8–27)
CO2: 26 mmol/L (ref 18–29)
Calcium: 10.3 mg/dL — ABNORMAL HIGH (ref 8.6–10.2)
Chloride: 101 mmol/L (ref 97–108)
Creatinine, Ser: 2.13 mg/dL — ABNORMAL HIGH (ref 0.76–1.27)
GFR calc Af Amer: 33 mL/min/{1.73_m2} — ABNORMAL LOW (ref >59)
GFR calc non Af Amer: 28 mL/min/{1.73_m2} — ABNORMAL LOW (ref >59)
Glucose: 151 mg/dL — ABNORMAL HIGH (ref 65–99)
Potassium: 4 mmol/L (ref 3.5–5.2)
Sodium: 142 mmol/L (ref 134–144)

## 2013-12-05 LAB — HEPATIC FUNCTION PANEL
ALT: 16 IU/L (ref 0–44)
AST: 22 IU/L (ref 0–40)
Albumin: 4.1 g/dL (ref 3.5–4.7)
Alkaline Phosphatase: 59 IU/L (ref 39–117)
Bilirubin, Direct: 0.21 mg/dL (ref 0.00–0.40)
Total Bilirubin: 0.6 mg/dL (ref 0.0–1.2)
Total Protein: 6.5 g/dL (ref 6.0–8.5)

## 2013-12-05 LAB — LIPASE: Lipase: 27 U/L (ref 0–59)

## 2014-01-12 ENCOUNTER — Other Ambulatory Visit: Payer: Self-pay | Admitting: Pharmacotherapy

## 2014-01-12 ENCOUNTER — Ambulatory Visit (INDEPENDENT_AMBULATORY_CARE_PROVIDER_SITE_OTHER): Payer: Medicare HMO | Admitting: Pharmacotherapy

## 2014-01-12 ENCOUNTER — Encounter: Payer: Self-pay | Admitting: Pharmacotherapy

## 2014-01-12 VITALS — BP 140/82 | HR 74 | Resp 10 | Wt 162.0 lb

## 2014-01-12 DIAGNOSIS — N184 Chronic kidney disease, stage 4 (severe): Secondary | ICD-10-CM

## 2014-01-12 DIAGNOSIS — E1122 Type 2 diabetes mellitus with diabetic chronic kidney disease: Secondary | ICD-10-CM

## 2014-01-12 DIAGNOSIS — I10 Essential (primary) hypertension: Secondary | ICD-10-CM

## 2014-01-12 DIAGNOSIS — N189 Chronic kidney disease, unspecified: Secondary | ICD-10-CM

## 2014-01-12 DIAGNOSIS — E1129 Type 2 diabetes mellitus with other diabetic kidney complication: Secondary | ICD-10-CM

## 2014-01-12 MED ORDER — INSULIN LISPRO 100 UNIT/ML (KWIKPEN): PEN_INJECTOR | SUBCUTANEOUS | Status: DC

## 2014-01-12 NOTE — Patient Instructions (Signed)
Continue Levemir 20 units daily. Continue Humalog 4 units breakfast, 4 units lunch, 7 units supper

## 2014-01-12 NOTE — Addendum Note (Signed)
Addended by: Logan Bores on: 01/12/2014 09:48 AM   Modules accepted: Orders

## 2014-01-12 NOTE — Progress Notes (Signed)
  Subjective:    Wesley Garcia is a 80 y.o. white male who presents for follow-up of Type 2 diabetes mellitus.   Last A1C in July was 6.9%. Did not have labs done prior to this OV.  Average BG:  146mg /dl No hypoglycemia.  Having some peripheral edema.  Right > left. Ran out of Humalog yesterday. New insurance company prefers Humalog and Toujeo. Cheating on meal planning. Dancing and exercise class. Denies peripheral neuropathy. Denies problems with vision. Nocturia several times per night (2-3).  Review of Systems A comprehensive review of systems was negative except for: Cardiovascular: positive for lower extremity edema Genitourinary: positive for nocturia    Objective:    BP 140/82 mmHg  Pulse 74  Resp 10  Wt 162 lb (73.483 kg)  SpO2 97%  General:  alert, cooperative and no distress  Oropharynx: abnormal findings: none   Eyes:  negative findings: lids and lashes normal and conjunctivae and sclerae normal   Ears:  external ears normal        Lung: clear to auscultation bilaterally  Heart:  regular rate and rhythm, S1, S2 normal, no murmur, click, rub or gallop     Extremities: edema right LE > left LE  Skin: warm and dry, no hyperpigmentation, vitiligo, or suspicious lesions     Neuro: mental status, speech normal, alert and oriented x3 and gait and station normal   Lab Review GLUCOSE (mg/dL)  Date Value  12/04/2013 151*  09/11/2013 83  05/09/2013 90   CO2 (mmol/L)  Date Value  12/04/2013 26  09/11/2013 23  05/09/2013 22   BUN (mg/dL)  Date Value  12/04/2013 25  09/11/2013 24  05/09/2013 22   CREATININE, SER (mg/dL)  Date Value  12/04/2013 2.13*  09/11/2013 2.12*  05/09/2013 1.97*    Labs today:  A1C, CMP   Assessment:    Diabetes Mellitus type II, under good control.   BP at goal <140/90 Last SCr 2.13.     Plan:    1.  Rx changes: change Novolog to Humalog for insurance reasons. 2.  Continue Levemir 20 units daily. 3.  Continue Humalog  4 units breakfast, lunch, 7 units supper. 4.  BP well controlled on current RX. 5.  Peripheral edema may be related to amlodipine or CKD.  Counseled on non-RX ways to treat edema. 6.  Reviewed nutrition goals. 7.  Exercise goals 30-45 minutes 5 x week. 8.  RTC in 4 months

## 2014-01-12 NOTE — Addendum Note (Signed)
Addended by: Logan Bores on: 01/12/2014 09:26 AM   Modules accepted: Orders, Medications

## 2014-01-12 NOTE — Addendum Note (Signed)
Addended by: Tivis Ringer on: 01/12/2014 09:45 AM   Modules accepted: Orders

## 2014-01-13 LAB — COMPREHENSIVE METABOLIC PANEL
ALT: 13 IU/L (ref 0–44)
AST: 19 IU/L (ref 0–40)
Albumin/Globulin Ratio: 1.4 (ref 1.1–2.5)
Albumin: 3.9 g/dL (ref 3.5–4.7)
Alkaline Phosphatase: 64 IU/L (ref 39–117)
BUN/Creatinine Ratio: 12 (ref 10–22)
BUN: 25 mg/dL (ref 8–27)
CO2: 23 mmol/L (ref 18–29)
Calcium: 9.3 mg/dL (ref 8.6–10.2)
Chloride: 100 mmol/L (ref 97–108)
Creatinine, Ser: 2.1 mg/dL — ABNORMAL HIGH (ref 0.76–1.27)
GFR calc Af Amer: 33 mL/min/{1.73_m2} — ABNORMAL LOW (ref >59)
GFR calc non Af Amer: 29 mL/min/{1.73_m2} — ABNORMAL LOW (ref >59)
Globulin, Total: 2.7 g/dL (ref 1.5–4.5)
Glucose: 280 mg/dL — ABNORMAL HIGH (ref 65–99)
Potassium: 4.2 mmol/L (ref 3.5–5.2)
Sodium: 139 mmol/L (ref 134–144)
Total Bilirubin: 0.7 mg/dL (ref 0.0–1.2)
Total Protein: 6.6 g/dL (ref 6.0–8.5)

## 2014-01-13 LAB — HEMOGLOBIN A1C
Est. average glucose Bld gHb Est-mCnc: 151 mg/dL
Hgb A1c MFr Bld: 6.9 % — ABNORMAL HIGH (ref 4.8–5.6)

## 2014-01-15 ENCOUNTER — Other Ambulatory Visit: Payer: Medicare HMO

## 2014-01-19 ENCOUNTER — Ambulatory Visit: Payer: Medicare HMO | Admitting: Pharmacotherapy

## 2014-02-09 ENCOUNTER — Ambulatory Visit (INDEPENDENT_AMBULATORY_CARE_PROVIDER_SITE_OTHER): Payer: Medicare HMO | Admitting: Internal Medicine

## 2014-02-09 ENCOUNTER — Encounter: Payer: Self-pay | Admitting: Internal Medicine

## 2014-02-09 VITALS — BP 148/80 | HR 65 | Resp 10 | Ht 66.0 in | Wt 162.0 lb

## 2014-02-09 DIAGNOSIS — E1122 Type 2 diabetes mellitus with diabetic chronic kidney disease: Secondary | ICD-10-CM

## 2014-02-09 DIAGNOSIS — N058 Unspecified nephritic syndrome with other morphologic changes: Secondary | ICD-10-CM

## 2014-02-09 DIAGNOSIS — N189 Chronic kidney disease, unspecified: Secondary | ICD-10-CM

## 2014-02-09 DIAGNOSIS — E162 Hypoglycemia, unspecified: Secondary | ICD-10-CM

## 2014-02-09 DIAGNOSIS — R079 Chest pain, unspecified: Secondary | ICD-10-CM

## 2014-02-09 DIAGNOSIS — E1129 Type 2 diabetes mellitus with other diabetic kidney complication: Secondary | ICD-10-CM

## 2014-02-09 DIAGNOSIS — I10 Essential (primary) hypertension: Secondary | ICD-10-CM

## 2014-02-09 MED ORDER — AMLODIPINE BESYLATE 5 MG PO TABS: 5.0000 mg | ORAL_TABLET | Freq: Every day | ORAL | Status: DC

## 2014-02-09 MED ORDER — INSULIN DETEMIR 100 UNIT/ML FLEXPEN: 15.0000 [IU] | PEN_INJECTOR | Freq: Every day | SUBCUTANEOUS | Status: DC

## 2014-02-09 MED ORDER — INSULIN LISPRO 100 UNIT/ML (KWIKPEN): PEN_INJECTOR | SUBCUTANEOUS | Status: DC

## 2014-02-09 NOTE — Patient Instructions (Signed)
Try taking amlodipine 5mg  daily.   Monitor your blood pressure--if readings are remaining over 140/90, please let me know.   Also if you get more dizzy instead of less, let me know.  Stop your breakfast meal coverage insulin (humalog), cont the 3 units and 7 units if needed at lunch and supper Continue the levemir 15 units Let me know if you have ANY lows whatsoever

## 2014-02-09 NOTE — Progress Notes (Signed)
Patient ID: Wesley Garcia, male   DOB: [birth year], 80 y.o.   MRN: [id number]   Location:  [facility]  Code Status: DNR  No Known Allergies  Chief Complaint  Patient presents with  . Acute Visit    Chest pain and weakness, onset this am     HPI: Patient is a 80 y.o. white male seen in the office today for an acute visit due to chest pain and weakness that began this am. Having difficulty with sugar dropping into the 40s after eating a good meal.  Yesterday am was 240, then dropped suddenly to 50s or 40s.  Chest pains in left chest happened after exercising this am (doing balance class) for a few seconds.  No dyspnea or diaphoresis.  Happened while sitting playing cards at home afterward.  CBG was 140 at that point.   Skipped other exercise his wife went to b/c of weakness.    BP also high for over a month in the mornings.  Sometimes when takes exercise, can hear his heart beating in his right ear.  Got excited watching football and it happened then, too.  During the night last night, he got up at least 5x to urinate.   Hasn't noticed if increased daytime frequency.  Did drink a lot of lemonade yesterday.   Doing 3/3/7 of meal coverage.   Doing 15 units of levemir which has helped.   Usually drops in mornings after exercises.  Feels like he was feeling stronger last week--went to [employer] 6 days last week.  Felt like doing better.  Now today, suddenly weak all over.   BPs have been as high as 123456 systolic.  This am was 123456 systolic.    Review of Systems:  Review of Systems  Constitutional: Negative for fever and chills.  HENT: Negative for congestion.   Respiratory: Negative for shortness of breath and wheezing.   Cardiovascular: Positive for chest pain. Negative for palpitations, orthopnea, claudication, leg swelling and PND.  Gastrointestinal: Negative for abdominal pain.  Genitourinary: Negative for dysuria.  Musculoskeletal: Negative for  myalgias and falls.  Skin: Negative for rash.  Neurological: Positive for dizziness. Negative for loss of consciousness and headaches.  Psychiatric/Behavioral: Positive for memory loss. The patient is nervous/anxious.     Past Medical History  Diagnosis Date  . Diabetes mellitus   . Chronic kidney disease (CKD), stage III (moderate)   . Squamous cell carcinoma of hard palate 2003  . Diverticulosis   . Hemorrhoids   . Hx of adenomatous colonic polyps 2006    Past Surgical History  Procedure Laterality Date  . Maxillectomy Right 2003    SCCA alveolar ridge  . Colonoscopy  11/02/2007    Social History:   reports that he quit smoking about 13 years ago. His smoking use included Cigars. He has never used smokeless tobacco. He reports that he drinks alcohol. He reports that he does not use illicit drugs.  Family History  Problem Relation Age of Onset  . Cancer Mother     ?  Marland Kitchen Bone cancer Sister   . Diabetes Brother   . Kidney disease Sister   . Colon cancer Neg Hx     Medications: Patient's Medications  New Prescriptions   No medications on file  Previous Medications   AMBULATORY NON FORMULARY MEDICATION    Sure Comfort Pen Needles 31Gx3/16" Use as Directed to inject insulin to control blood sugar Dx 250.00   AMLODIPINE (  NORVASC) 2.5 MG TABLET    Take 1 tablet by mouth daily   GLUCOSE BLOOD TEST STRIP    One Touch Ultra test strip used to test blood sugar three times daily. Dx: 250.00   INSULIN DETEMIR (LEVEMIR FLEXTOUCH) 100 UNIT/ML PEN    Inject 20 Units into the skin daily.   INSULIN LISPRO (HUMALOG KWIKPEN) 100 UNIT/ML KIWKPEN    Inject into the skin 3 (three) times daily. 4 units at breakfast, 4 units at lunch, 7 units at supper  Modified Medications   No medications on file  Discontinued Medications   No medications on file     Physical Exam: Filed Vitals:   02/09/14 1154  BP: 148/80  Pulse: 65  Resp: 10  Height: 5\' 6"  (1.676 m)  Weight: 162 lb (73.483 kg)    SpO2: 99%  Physical Exam  Constitutional: He is oriented to person, place, and time. He appears well-developed and well-nourished. No distress.  Cardiovascular: Normal rate, regular rhythm, normal heart sounds and intact distal pulses.   Pulmonary/Chest: Effort normal and breath sounds normal. No respiratory distress.  Abdominal: Soft. Bowel sounds are normal. He exhibits no distension and no mass. There is no tenderness.  Musculoskeletal: Normal range of motion.  Neurological: He is alert and oriented to person, place, and time.  Skin: Skin is warm and dry.  Psychiatric: He has a normal mood and affect.    Labs reviewed: Basic Metabolic Panel:  Recent Labs  09/11/13 0807 12/04/13 0820 01/12/14 0954  NA 139 142 139  K 3.9 4.0 4.2  CL 102 101 100  CO2 23 26 23   GLUCOSE 83 151* 280*  BUN 24 25 25   CREATININE 2.12* 2.13* 2.10*  CALCIUM 9.7 10.3* 9.3   Liver Function Tests:  Recent Labs  09/11/13 0807 12/04/13 0820 01/12/14 0954  AST 20 22 19   ALT 13 16 13   ALKPHOS 60 59 64  BILITOT 0.6 0.6 0.7  PROT 6.4 6.5 6.6    Recent Labs  12/04/13 0820  LIPASE 27   No results for input(s): AMMONIA in the last 8760 hours. CBC:  Recent Labs  04/07/13 1232 12/04/13 0820  WBC 7.5 6.2  NEUTROABS 4.9 4.0  HGB 13.4 13.8  HCT 38.7 40.6  MCV 90 93  PLT  --  198   Lipid Panel:  Recent Labs  09/11/13 0807  HDL 58  LDLCALC 30  TRIG 31  CHOLHDL 1.6   Lab Results  Component Value Date   HGBA1C 6.9* 01/12/2014   Assessment/Plan 1. Chest pain, unspecified chest pain type -atypical, lasted only seconds, occurred at rest after he had finished exercising this morning and had no associated symptoms, has not recurred - EKG 12-Lead was unremarkable--NSR w/o acute ischemia or infarct at rate of 74 bpm  2. Hypoglycemia -will reduce insulins further: -Stop humalog at breakfast, cont 3 units at lunch and 7 units at supper -cont levemir 15 units in the mornings--one  insulin pen given today 3. Uncontrolled hypertension -bp has been elevated -advised to take norvasc daily and increase from 2.5mg  to 5mg  daily (had the 2.5mg  this am and was still elevated)--I suspect his dizziness and weakness may be from high bp and low glucose - amLODipine (NORVASC) 5 MG tablet; Take 1 tablet (5 mg total) by mouth daily.  Dispense: 90 tablet; Refill: 3  4. Type 2 diabetes mellitus with diabetic chronic kidney disease -reduce insulin as above due to hypoglycemia--to let me know if he still has some  lows  5. Type 2 diabetes mellitus, controlled, with renal complications -given a sample pen  - Insulin Detemir (LEVEMIR FLEXTOUCH) 100 UNIT/ML Pen; Inject 15 Units into the skin daily.  Dispense: 15 mL; Refill: 11 -decreased humalog as above  Labs/tests ordered:   Orders Placed This Encounter  Procedures  . EKG 12-Lead    Next appt:  Keep as scheduled  Esty Ahuja L. Leodis Alcocer, D.O. [facility] [address], [place] [address] Cell Phone (Mon-Fri 8am-5pm):  [phone] On Call:  [phone] & follow prompts after 5pm & weekends Office Phone:  [phone] Office Fax:  [phone]

## 2014-02-18 ENCOUNTER — Encounter: Payer: Self-pay | Admitting: Internal Medicine

## 2014-02-19 ENCOUNTER — Ambulatory Visit: Payer: Medicare HMO | Admitting: Internal Medicine

## 2014-02-23 ENCOUNTER — Encounter: Payer: Self-pay | Admitting: Pharmacotherapy

## 2014-02-23 ENCOUNTER — Ambulatory Visit: Payer: Self-pay | Admitting: Internal Medicine

## 2014-02-23 ENCOUNTER — Ambulatory Visit (INDEPENDENT_AMBULATORY_CARE_PROVIDER_SITE_OTHER): Payer: Medicare HMO | Admitting: Pharmacotherapy

## 2014-02-23 VITALS — BP 160/78 | HR 74 | Temp 97.9°F | Resp 18 | Ht 66.0 in | Wt 162.0 lb

## 2014-02-23 DIAGNOSIS — I10 Essential (primary) hypertension: Secondary | ICD-10-CM

## 2014-02-23 DIAGNOSIS — N189 Chronic kidney disease, unspecified: Secondary | ICD-10-CM

## 2014-02-23 DIAGNOSIS — E1122 Type 2 diabetes mellitus with diabetic chronic kidney disease: Secondary | ICD-10-CM

## 2014-02-23 NOTE — Patient Instructions (Signed)
Continue Levemir 15 units daily Continue Humalog with meals. Keep scheduled OV

## 2014-02-23 NOTE — Progress Notes (Signed)
  Subjective:    Wesley Garcia is a 80 y.o.white male who presents for follow-up of Type 2 diabetes mellitus.   His A1C is excellent at 6.9%. He has questions and insulin.  Has seen advertisements for [facility].  Currently on Levemir 20 units daily and Humalog with meals. Has noted that he was having hypoglycemia after exercise (40's) He cut his Levemir to 15 units daily.  He reports his BG has been 170's fasting. Lowest has been 129 since the reduction.  Eating healthy, exercising daily.  Review of Systems A comprehensive review of systems was negative except for: Genitourinary: positive for nocturia    Objective:    BP 160/78 mmHg  Pulse 74  Temp(Src) 97.9 F (36.6 C) (Oral)  Resp 18  Ht 5' 6"$  (1.676 m)  Wt 162 lb (73.483 kg)  BMI 26.16 kg/m2  SpO2 90%  General:  alert, cooperative and no distress  Oropharynx: normal findings: lips normal without lesions and gums healthy   Eyes:  negative findings: lids and lashes normal and conjunctivae and sclerae normal   Ears:  external ears normal        Lung: clear to auscultation bilaterally  Heart:  regular rate and rhythm     Extremities: no edema  Skin: warm and dry, no hyperpigmentation, vitiligo, or suspicious lesions     Neuro: mental status, speech normal, alert and oriented x3 and gait and station normal   Lab Review GLUCOSE (mg/dL)  Date Value  01/12/2014 280*  12/04/2013 151*  09/11/2013 83   CO2 (mmol/L)  Date Value  01/12/2014 23  12/04/2013 26  09/11/2013 23   BUN (mg/dL)  Date Value  01/12/2014 25  12/04/2013 25  09/11/2013 24   CREATININE, SER (mg/dL)  Date Value  01/12/2014 2.10*  12/04/2013 2.13*  09/11/2013 2.12*     Assessment:    Diabetes Mellitus type II, under excellent control. A1C 6.9% BP above target <140/90   Plan:    1.  Rx changes: none 2.  Continue Levemir 15 units daily 3.  Continue Humalog with meals. 4.  Discussed pro / con of changing long-acting insulin to Toujeo. 5.   BP above target today.  Followed closely by Dr. [name] (nephrology).  Will monitor.

## 2014-03-30 ENCOUNTER — Other Ambulatory Visit: Payer: Self-pay | Admitting: *Deleted

## 2014-03-30 DIAGNOSIS — E1129 Type 2 diabetes mellitus with other diabetic kidney complication: Secondary | ICD-10-CM

## 2014-03-30 MED ORDER — INSULIN DETEMIR 100 UNIT/ML FLEXPEN: 15.0000 [IU] | PEN_INJECTOR | Freq: Every day | SUBCUTANEOUS | Status: DC

## 2014-03-30 NOTE — Telephone Encounter (Signed)
Patient requested to be faxed to pharmacy 

## 2014-04-16 ENCOUNTER — Emergency Department (HOSPITAL_COMMUNITY): Payer: PPO

## 2014-04-16 ENCOUNTER — Observation Stay (HOSPITAL_COMMUNITY)
Admission: EM | Admit: 2014-04-16 | Discharge: 2014-04-18 | Disposition: A | Discharge status: Home / Self Care | Payer: PPO | Attending: Internal Medicine | Admitting: Internal Medicine

## 2014-04-16 ENCOUNTER — Encounter (HOSPITAL_COMMUNITY): Payer: Self-pay | Admitting: Emergency Medicine

## 2014-04-16 DIAGNOSIS — H9193 Unspecified hearing loss, bilateral: Secondary | ICD-10-CM | POA: Diagnosis not present

## 2014-04-16 DIAGNOSIS — R1011 Right upper quadrant pain: Secondary | ICD-10-CM | POA: Diagnosis present

## 2014-04-16 DIAGNOSIS — N183 Chronic kidney disease, stage 3 unspecified: Secondary | ICD-10-CM | POA: Diagnosis present

## 2014-04-16 DIAGNOSIS — Z794 Long term (current) use of insulin: Secondary | ICD-10-CM | POA: Insufficient documentation

## 2014-04-16 DIAGNOSIS — Z79899 Other long term (current) drug therapy: Secondary | ICD-10-CM | POA: Insufficient documentation

## 2014-04-16 DIAGNOSIS — Z87891 Personal history of nicotine dependence: Secondary | ICD-10-CM | POA: Insufficient documentation

## 2014-04-16 DIAGNOSIS — E119 Type 2 diabetes mellitus without complications: Secondary | ICD-10-CM | POA: Insufficient documentation

## 2014-04-16 DIAGNOSIS — I129 Hypertensive chronic kidney disease with stage 1 through stage 4 chronic kidney disease, or unspecified chronic kidney disease: Secondary | ICD-10-CM | POA: Diagnosis not present

## 2014-04-16 DIAGNOSIS — K810 Acute cholecystitis: Secondary | ICD-10-CM | POA: Diagnosis present

## 2014-04-16 DIAGNOSIS — K812 Acute cholecystitis with chronic cholecystitis: Secondary | ICD-10-CM | POA: Diagnosis not present

## 2014-04-16 DIAGNOSIS — K8020 Calculus of gallbladder without cholecystitis without obstruction: Secondary | ICD-10-CM

## 2014-04-16 DIAGNOSIS — E1129 Type 2 diabetes mellitus with other diabetic kidney complication: Secondary | ICD-10-CM | POA: Diagnosis present

## 2014-04-16 HISTORY — DX: Essential (primary) hypertension: I10

## 2014-04-16 HISTORY — DX: Unspecified hearing loss, unspecified ear: H91.90

## 2014-04-16 LAB — CBC
HCT: 41.1 % (ref 39.0–52.0)
Hemoglobin: 13.8 g/dL (ref 13.0–17.0)
MCH: 31 pg (ref 26.0–34.0)
MCHC: 33.6 g/dL (ref 30.0–36.0)
MCV: 92.4 fL (ref 78.0–100.0)
Platelets: 218 10*3/uL (ref 150–400)
RBC: 4.45 MIL/uL (ref 4.22–5.81)
RDW: 13.1 % (ref 11.5–15.5)
WBC: 10.3 10*3/uL (ref 4.0–10.5)

## 2014-04-16 LAB — BASIC METABOLIC PANEL
Anion gap: 9 (ref 5–15)
BUN: 24 mg/dL — AB (ref 6–23)
CHLORIDE: 105 mmol/L (ref 96–112)
CO2: 23 mmol/L (ref 19–32)
Calcium: 9.4 mg/dL (ref 8.4–10.5)
Creatinine, Ser: 2.11 mg/dL — ABNORMAL HIGH (ref 0.50–1.35)
GFR calc Af Amer: 32 mL/min — ABNORMAL LOW (ref >90)
GFR calc non Af Amer: 28 mL/min — ABNORMAL LOW (ref >90)
Glucose, Bld: 199 mg/dL — ABNORMAL HIGH (ref 70–99)
Potassium: 4.5 mmol/L (ref 3.5–5.1)
Sodium: 137 mmol/L (ref 135–145)

## 2014-04-16 LAB — HEPATIC FUNCTION PANEL
ALK PHOS: 63 U/L (ref 39–117)
ALT: 18 U/L (ref 0–53)
AST: 21 U/L (ref 0–37)
Albumin: 3.8 g/dL (ref 3.5–5.2)
BILIRUBIN TOTAL: 0.6 mg/dL (ref 0.3–1.2)
Bilirubin, Direct: 0.2 mg/dL (ref 0.0–0.5)
Indirect Bilirubin: 0.4 mg/dL (ref 0.3–0.9)
Total Protein: 7.4 g/dL (ref 6.0–8.3)

## 2014-04-16 LAB — I-STAT TROPONIN, ED: TROPONIN I, POC: 0.01 ng/mL (ref 0.00–0.08)

## 2014-04-16 LAB — LIPASE, BLOOD: Lipase: 28 U/L (ref 11–59)

## 2014-04-16 LAB — D-DIMER, QUANTITATIVE: D-Dimer, Quant: 1.43 ug/mL-FEU — ABNORMAL HIGH (ref 0.00–0.48)

## 2014-04-16 NOTE — ED Notes (Signed)
Pt. reports right anterior ribcage pain worse with deep inspiration onset today , denies SOB , no cough , denies nausea or diaphoresis .

## 2014-04-16 NOTE — ED Provider Notes (Signed)
CSN: [id number]     Arrival date & time 04/16/14  2028 History   First MD Initiated Contact with Patient 04/16/14 2138     Chief Complaint  Patient presents with  . Abdominal Pain     (Consider location/radiation/quality/duration/timing/severity/associated sxs/prior Treatment) HPI  Pt presenting with c/o right upper abdominal pain. Pain came on after eating a sandwich earlier this afternoon.  Pain has been constant.  No fever/chills.  No vomiting.  He has not had similar pain in the past.  He states pain is worse with deep inspiration.  Pain is constant and sharp, no radiation.  There are no other associated systemic symptoms, there are no other alleviating or modifying factors. No difficulty breathing.   Past Medical History  Diagnosis Date  . Diabetes mellitus   . Chronic kidney disease (CKD), stage III (moderate)   . Squamous cell carcinoma of hard palate 2003  . Diverticulosis   . Hemorrhoids   . Hx of adenomatous colonic polyps 2006  . Hard of hearing   . Hypertension    Past Surgical History  Procedure Laterality Date  . Maxillectomy Right 2003    SCCA alveolar ridge  . Colonoscopy  11/02/2007   Family History  Problem Relation Age of Onset  . Cancer Mother     ?  Marland Kitchen Bone cancer Sister   . Diabetes Brother   . Kidney disease Sister   . Colon cancer Neg Hx    History  Substance Use Topics  . Smoking status: Former Smoker    Types: Cigars    Quit date: 04/07/2000  . Smokeless tobacco: Never Used  . Alcohol Use: Yes     Comment: socially    Review of Systems  ROS reviewed and all otherwise negative except for mentioned in HPI    Allergies  Review of patient's allergies indicates no known allergies.  Home Medications   Prior to Admission medications   Medication Sig Start Date End Date Taking? Authorizing Provider  amLODipine (NORVASC) 5 MG tablet Take 1 tablet (5 mg total) by mouth daily. 02/09/14  Yes Tiffany L Reed, DO  Insulin Detemir (LEVEMIR  FLEXTOUCH) 100 UNIT/ML Pen Inject 15 Units into the skin daily. 03/30/14  Yes Tiffany L Reed, DO  insulin lispro (HUMALOG KWIKPEN) 100 UNIT/ML KiwkPen Inject into the skin 2 (two) times daily. 0 units at breakfast, 3 units at lunch, 7 units at supper Patient taking differently: Inject 3-7 Units into the skin 2 (two) times daily. 3 units at lunch, 7 units at supper 02/09/14  Yes Tiffany L Reed, DO  AMBULATORY NON FORMULARY MEDICATION Sure Comfort Pen Needles 31Gx3/16" Use as Directed to inject insulin to control blood sugar Dx 250.00 12/03/13   Mahima Pandey, MD  glucose blood test strip One Touch Ultra test strip used to test blood sugar three times daily. Dx: 250.00 05/06/13   Estill Dooms, MD  oxyCODONE-acetaminophen (PERCOCET/ROXICET) 5-325 MG per tablet Take 1 tablet by mouth every 6 (six) hours as needed for severe pain. 04/18/14   Ripudeep Krystal Eaton, MD  promethazine (PHENERGAN) 12.5 MG tablet Take 1 tablet (12.5 mg total) by mouth every 6 (six) hours as needed for nausea or vomiting. 04/18/14   Ripudeep Krystal Eaton, MD  tamsulosin (FLOMAX) 0.4 MG CAPS capsule Take 1 capsule (0.4 mg total) by mouth daily. 04/18/14   Ripudeep Krystal Eaton, MD   BP 161/62 mmHg  Pulse 89  Temp(Src) 98.9 F (37.2 C) (Oral)  Resp 20  Ht 5'  6" (1.676 m)  Wt 157 lb 14.4 oz (71.623 kg)  BMI 25.50 kg/m2  SpO2 96%  Vitals reviewed Physical Exam  Physical Examination: General appearance - alert, well appearing, and in no distress Mental status - alert, oriented to person, place, and time Eyes -no conjunctival injection, no scleral icterus Mouth - mucous membranes moist, pharynx normal without lesions Chest - clear to auscultation, no wheezes, rales or rhonchi, symmetric air entry Heart - normal rate, regular rhythm, normal S1, S2, no murmurs, rubs, clicks or gallops Abdomen - soft, ttp in right upper abdomen, no gaurding and no rebound, nondistended, no masses or organomegaly Extremities - peripheral pulses normal, no pedal edema,  no clubbing or cyanosis Skin - normal coloration and turgor, no rashes  ED Course  Procedures (including critical care time)  12:23 AM d/w Dr. Humphrey Rolls, [facility] for [facility].  Awaiting call back from surgery.    12:30 AM d/w Dr. Donne Hazel, surgery.  He states he will write for antibiotics as he will choose the correct antibiotics for this patient.  He will see patient in the ED and agrees with plan for hospitalist admission.   Labs Review Labs Reviewed  BASIC METABOLIC PANEL - Abnormal; Notable for the following:    Glucose, Bld 199 (*)    BUN 24 (*)    Creatinine, Ser 2.11 (*)    GFR calc non Af Amer 28 (*)    GFR calc Af Amer 32 (*)    All other components within normal limits  D-DIMER, QUANTITATIVE - Abnormal; Notable for the following:    D-Dimer, Quant 1.43 (*)    All other components within normal limits  COMPREHENSIVE METABOLIC PANEL - Abnormal; Notable for the following:    Glucose, Bld 167 (*)    Creatinine, Ser 1.94 (*)    Albumin 3.1 (*)    Total Bilirubin 1.3 (*)    GFR calc non Af Amer 30 (*)    GFR calc Af Amer 35 (*)    All other components within normal limits  CBC - Abnormal; Notable for the following:    RBC 4.10 (*)    Hemoglobin 12.7 (*)    HCT 38.0 (*)    All other components within normal limits  HEMOGLOBIN A1C - Abnormal; Notable for the following:    Hgb A1c MFr Bld 8.7 (*)    All other components within normal limits  GLUCOSE, CAPILLARY - Abnormal; Notable for the following:    Glucose-Capillary 172 (*)    All other components within normal limits  GLUCOSE, CAPILLARY - Abnormal; Notable for the following:    Glucose-Capillary 112 (*)    All other components within normal limits  GLUCOSE, CAPILLARY - Abnormal; Notable for the following:    Glucose-Capillary 163 (*)    All other components within normal limits  GLUCOSE, CAPILLARY - Abnormal; Notable for the following:    Glucose-Capillary 208 (*)    All other components within normal limits  GLUCOSE,  CAPILLARY - Abnormal; Notable for the following:    Glucose-Capillary 170 (*)    All other components within normal limits  GLUCOSE, CAPILLARY - Abnormal; Notable for the following:    Glucose-Capillary 193 (*)    All other components within normal limits  MRSA PCR SCREENING  CBC  HEPATIC FUNCTION PANEL  LIPASE, BLOOD  TSH  I-STAT TROPOININ, ED  SURGICAL PATHOLOGY    Imaging Review Dg Ribs Unilateral W/chest Right  04/16/2014   CLINICAL DATA:  Right-sided anterior rib pain for  5 hours, no known injury, initial encounter  EXAM: RIGHT RIBS AND CHEST - 3+ VIEW  COMPARISON:  04/07/2013  FINDINGS: Cardiac shadow is stable. The lungs are clear bilaterally.No acute rib fracture is identified. The area of clinical abnormality corresponds to the anterior costal cartilage.  IMPRESSION: No acute rib fracture noted.   Electronically Signed   By: Inez Catalina M.D.   On: 04/16/2014 21:28   Dg Cholangiogram Operative  04/17/2014   CLINICAL DATA:  Acute cholecystitis  EXAM: INTRAOPERATIVE CHOLANGIOGRAM  TECHNIQUE: Cholangiographic images from the C-arm fluoroscopic device were submitted for interpretation post-operatively. Please see the procedural report for the amount of contrast and the fluoroscopy time utilized.  COMPARISON:  Abdominal ultrasound 04/16/2014  FINDINGS: Cine loop and spot images obtained during intraoperative cholangiogram at the time of all laparoscopic cholecystectomy demonstrate cannulation of the cystic duct remnant and opacification of the intra and extrahepatic biliary tree. No evidence of biliary ductal dilatation, stenosis, stricture or filling defect. Contrast material passes freely through the ampulla and into the duodenum. There is a moderate extravasation of injected contrast material from the cystic duct remnant.  IMPRESSION: 1. Negative intraoperative cholangiogram. 2. Extravasation of injected contrast material from the cystic duct remnant.   Electronically Signed   By: Jacqulynn Cadet M.D.   On: 04/17/2014 13:29   US Abdomen Limited  04/17/2014   CLINICAL DATA:  Right upper quadrant pain. History of diabetes, chronic renal disease, hypertension.  EXAM: US ABDOMEN LIMITED - RIGHT UPPER QUADRANT  COMPARISON:  CT abdomen and pelvis 08/04/2011  FINDINGS: Gallbladder:  Gallbladder is filled with echogenic sludge and small echogenic structures likely representing small stones. Largest stone measures 5 mm. Gallbladder wall is thickened, measuring up to 4.1 mm. Murphy's sign is positive. Changes are consistent with acute cholecystitis in the appropriate clinical setting.  Common bile duct:  Common bile duct is obscured by bowel gas. Right hepatic duct is visualized is borderline enlarged, measuring 6 mm.  Liver:  Diffusely increased hepatic parenchymal echotexture suggesting fatty infiltration.  IMPRESSION: Sludge and stones in the gallbladder with gallbladder wall thickening and positive Murphy's sign. Changes are consistent with acute cholecystitis in the appropriate clinical setting.   Electronically Signed   By: Lucienne Capers M.D.   On: 04/17/2014 00:02     EKG Interpretation None      Date: 04/18/2014  Rate: 65  Rhythm: normal sinus rhythm  QRS Axis: normal  Intervals: normal  ST/T Wave abnormalities: normal  Conduction Disutrbances: none  Narrative Interpretation: unremarkable    MDM   Final diagnoses:  Gallstones  cholecystitis Abdominal pain  Pt presenting with c/o right upper abdominal pain, xray of rib obtained from triage nurse as patient initially stated his right ribs were hurting.  On exam he has rightupper abdominal pain.  No fever.  US shows cholecystitis.  D/w hospitalist for admission, Dr. Donne Hazel, surgery to see.  Dr. Donne Hazel states he will write for antibiotics.      Threasa Beards, MD 04/18/14 [phone]

## 2014-04-16 NOTE — ED Notes (Signed)
MD at bedside. 

## 2014-04-17 ENCOUNTER — Encounter (HOSPITAL_COMMUNITY)
Admission: EM | Disposition: A | Discharge status: Home / Self Care | Payer: Self-pay | Source: Home / Self Care | Attending: Internal Medicine

## 2014-04-17 ENCOUNTER — Encounter (HOSPITAL_COMMUNITY): Payer: Self-pay | Admitting: *Deleted

## 2014-04-17 ENCOUNTER — Inpatient Hospital Stay (HOSPITAL_COMMUNITY): Payer: PPO

## 2014-04-17 ENCOUNTER — Inpatient Hospital Stay (HOSPITAL_COMMUNITY): Payer: PPO | Admitting: Certified Registered Nurse Anesthetist

## 2014-04-17 DIAGNOSIS — K810 Acute cholecystitis: Secondary | ICD-10-CM | POA: Diagnosis present

## 2014-04-17 DIAGNOSIS — E119 Type 2 diabetes mellitus without complications: Secondary | ICD-10-CM | POA: Diagnosis present

## 2014-04-17 DIAGNOSIS — N183 Chronic kidney disease, stage 3 unspecified: Secondary | ICD-10-CM | POA: Diagnosis present

## 2014-04-17 DIAGNOSIS — R1011 Right upper quadrant pain: Secondary | ICD-10-CM | POA: Diagnosis present

## 2014-04-17 DIAGNOSIS — E118 Type 2 diabetes mellitus with unspecified complications: Secondary | ICD-10-CM

## 2014-04-17 DIAGNOSIS — E1129 Type 2 diabetes mellitus with other diabetic kidney complication: Secondary | ICD-10-CM | POA: Diagnosis present

## 2014-04-17 DIAGNOSIS — K8020 Calculus of gallbladder without cholecystitis without obstruction: Secondary | ICD-10-CM | POA: Insufficient documentation

## 2014-04-17 DIAGNOSIS — I129 Hypertensive chronic kidney disease with stage 1 through stage 4 chronic kidney disease, or unspecified chronic kidney disease: Secondary | ICD-10-CM | POA: Diagnosis not present

## 2014-04-17 DIAGNOSIS — K812 Acute cholecystitis with chronic cholecystitis: Secondary | ICD-10-CM | POA: Diagnosis not present

## 2014-04-17 HISTORY — PX: CHOLECYSTECTOMY: SHX55

## 2014-04-17 LAB — GLUCOSE, CAPILLARY
Glucose-Capillary: 172 mg/dL — ABNORMAL HIGH (ref 70–99)
Glucose-Capillary: 112 mg/dL — ABNORMAL HIGH (ref 70–99)
Glucose-Capillary: 163 mg/dL — ABNORMAL HIGH (ref 70–99)
Glucose-Capillary: 208 mg/dL — ABNORMAL HIGH (ref 70–99)
Glucose-Capillary: 170 mg/dL — ABNORMAL HIGH (ref 70–99)

## 2014-04-17 LAB — CBC
HCT: 38 % — ABNORMAL LOW (ref 39.0–52.0)
HEMOGLOBIN: 12.7 g/dL — AB (ref 13.0–17.0)
MCH: 31 pg (ref 26.0–34.0)
MCHC: 33.4 g/dL (ref 30.0–36.0)
MCV: 92.7 fL (ref 78.0–100.0)
PLATELETS: 193 10*3/uL (ref 150–400)
RBC: 4.1 MIL/uL — AB (ref 4.22–5.81)
RDW: 13 % (ref 11.5–15.5)
WBC: 8 10*3/uL (ref 4.0–10.5)

## 2014-04-17 LAB — COMPREHENSIVE METABOLIC PANEL
ALK PHOS: 56 U/L (ref 39–117)
ALT: 17 U/L (ref 0–53)
AST: 19 U/L (ref 0–37)
Albumin: 3.1 g/dL — ABNORMAL LOW (ref 3.5–5.2)
Anion gap: 7 (ref 5–15)
BILIRUBIN TOTAL: 1.3 mg/dL — AB (ref 0.3–1.2)
BUN: 21 mg/dL (ref 6–23)
CHLORIDE: 106 mmol/L (ref 96–112)
CO2: 24 mmol/L (ref 19–32)
Calcium: 8.7 mg/dL (ref 8.4–10.5)
Creatinine, Ser: 1.94 mg/dL — ABNORMAL HIGH (ref 0.50–1.35)
GFR, EST AFRICAN AMERICAN: 35 mL/min — AB (ref >90)
GFR, EST NON AFRICAN AMERICAN: 30 mL/min — AB (ref >90)
GLUCOSE: 167 mg/dL — AB (ref 70–99)
Potassium: 4 mmol/L (ref 3.5–5.1)
Sodium: 137 mmol/L (ref 135–145)
Total Protein: 6.7 g/dL (ref 6.0–8.3)

## 2014-04-17 LAB — MRSA PCR SCREENING: MRSA by PCR: NEGATIVE

## 2014-04-17 LAB — TSH: TSH: 2.097 u[IU]/mL (ref 0.350–4.500)

## 2014-04-17 SURGERY — LAPAROSCOPIC CHOLECYSTECTOMY WITH INTRAOPERATIVE CHOLANGIOGRAM
Anesthesia: General | Site: Abdomen

## 2014-04-17 MED ORDER — ADULT MULTIVITAMIN W/MINERALS CH
1.0000 | ORAL_TABLET | Freq: Every day | ORAL | Status: DC
  Filled 2014-04-17: qty 1

## 2014-04-17 MED ORDER — IOHEXOL 300 MG/ML  SOLN
INTRAMUSCULAR | Status: DC | PRN
  Administered 2014-04-17: 13:00:00

## 2014-04-17 MED ORDER — ONDANSETRON HCL 4 MG/2ML IJ SOLN
INTRAMUSCULAR | Status: AC
  Filled 2014-04-17: qty 2

## 2014-04-17 MED ORDER — METOCLOPRAMIDE HCL 5 MG/ML IJ SOLN: 10.0000 mg | Freq: Once | INTRAMUSCULAR | Status: DC | PRN

## 2014-04-17 MED ORDER — BUPIVACAINE-EPINEPHRINE 0.25% -1:200000 IJ SOLN
INTRAMUSCULAR | Status: DC | PRN
  Administered 2014-04-17: 30 mL

## 2014-04-17 MED ORDER — SUCCINYLCHOLINE CHLORIDE 20 MG/ML IJ SOLN
INTRAMUSCULAR | Status: DC | PRN
  Administered 2014-04-17: 100 mg via INTRAVENOUS

## 2014-04-17 MED ORDER — ONDANSETRON HCL 4 MG PO TABS: 4.0000 mg | ORAL_TABLET | Freq: Four times a day (QID) | ORAL | Status: DC | PRN

## 2014-04-17 MED ORDER — BUPIVACAINE-EPINEPHRINE (PF) 0.25% -1:200000 IJ SOLN
INTRAMUSCULAR | Status: AC
  Filled 2014-04-17: qty 30

## 2014-04-17 MED ORDER — 0.9 % SODIUM CHLORIDE (POUR BTL) OPTIME
TOPICAL | Status: DC | PRN
  Administered 2014-04-17: 1000 mL

## 2014-04-17 MED ORDER — OXYCODONE HCL 5 MG PO TABS
5.0000 mg | ORAL_TABLET | ORAL | Status: DC | PRN
Start: 2014-04-17 — End: 2014-04-18

## 2014-04-17 MED ORDER — ROCURONIUM BROMIDE 100 MG/10ML IV SOLN
INTRAVENOUS | Status: DC | PRN
  Administered 2014-04-17 (×2): 5 mg via INTRAVENOUS
  Administered 2014-04-17: 20 mg via INTRAVENOUS

## 2014-04-17 MED ORDER — ACETAMINOPHEN 650 MG RE SUPP: 650.0000 mg | Freq: Four times a day (QID) | RECTAL | Status: DC | PRN

## 2014-04-17 MED ORDER — GLYCOPYRROLATE 0.2 MG/ML IJ SOLN
INTRAMUSCULAR | Status: AC
  Filled 2014-04-17: qty 4

## 2014-04-17 MED ORDER — LIDOCAINE HCL (CARDIAC) 20 MG/ML IV SOLN
INTRAVENOUS | Status: DC | PRN
  Administered 2014-04-17: 70 mg via INTRAVENOUS

## 2014-04-17 MED ORDER — EPHEDRINE SULFATE 50 MG/ML IJ SOLN
INTRAMUSCULAR | Status: DC | PRN
  Administered 2014-04-17: 5 mg via INTRAVENOUS
  Administered 2014-04-17: 10 mg via INTRAVENOUS

## 2014-04-17 MED ORDER — HEPARIN SODIUM (PORCINE) 5000 UNIT/ML IJ SOLN
5000.0000 [IU] | Freq: Three times a day (TID) | INTRAMUSCULAR | Status: DC
  Administered 2014-04-17: 5000 [IU] via SUBCUTANEOUS
  Filled 2014-04-17 (×3): qty 1

## 2014-04-17 MED ORDER — MEPERIDINE HCL 25 MG/ML IJ SOLN: 6.2500 mg | INTRAMUSCULAR | Status: DC | PRN

## 2014-04-17 MED ORDER — HYDRALAZINE HCL 20 MG/ML IJ SOLN: 10.0000 mg | Freq: Four times a day (QID) | INTRAMUSCULAR | Status: DC | PRN

## 2014-04-17 MED ORDER — OXYCODONE-ACETAMINOPHEN 5-325 MG PO TABS: 1.0000 | ORAL_TABLET | ORAL | Status: DC | PRN

## 2014-04-17 MED ORDER — NEOSTIGMINE METHYLSULFATE 10 MG/10ML IV SOLN
INTRAVENOUS | Status: DC | PRN
  Administered 2014-04-17: 4 mg via INTRAVENOUS

## 2014-04-17 MED ORDER — LACTATED RINGERS IV SOLN
INTRAVENOUS | Status: DC
  Administered 2014-04-17: 12:00:00 via INTRAVENOUS

## 2014-04-17 MED ORDER — FOLIC ACID 1 MG PO TABS
1.0000 mg | ORAL_TABLET | Freq: Every day | ORAL | Status: DC
  Administered 2014-04-18: 1 mg via ORAL
  Filled 2014-04-17 (×3): qty 1

## 2014-04-17 MED ORDER — PROPOFOL 10 MG/ML IV BOLUS
INTRAVENOUS | Status: DC | PRN
  Administered 2014-04-17: 50 mg via INTRAVENOUS
  Administered 2014-04-17: 20 mg via INTRAVENOUS
  Administered 2014-04-17: 100 mg via INTRAVENOUS

## 2014-04-17 MED ORDER — ONDANSETRON HCL 4 MG/2ML IJ SOLN
4.0000 mg | Freq: Four times a day (QID) | INTRAMUSCULAR | Status: DC | PRN
  Administered 2014-04-17 – 2014-04-18 (×2): 4 mg via INTRAVENOUS
  Filled 2014-04-17 (×2): qty 2

## 2014-04-17 MED ORDER — LABETALOL HCL 5 MG/ML IV SOLN
INTRAVENOUS | Status: DC | PRN
  Administered 2014-04-17 (×2): 10 mg via INTRAVENOUS

## 2014-04-17 MED ORDER — ACETAMINOPHEN 325 MG PO TABS: 650.0000 mg | ORAL_TABLET | Freq: Four times a day (QID) | ORAL | Status: DC | PRN

## 2014-04-17 MED ORDER — SODIUM CHLORIDE 0.9 % IV SOLN
INTRAVENOUS | Status: DC
  Administered 2014-04-17 – 2014-04-18 (×2): via INTRAVENOUS

## 2014-04-17 MED ORDER — ARTIFICIAL TEARS OP OINT
TOPICAL_OINTMENT | OPHTHALMIC | Status: AC
  Filled 2014-04-17: qty 3.5

## 2014-04-17 MED ORDER — PIPERACILLIN-TAZOBACTAM 3.375 G IVPB 30 MIN
3.3750 g | Freq: Once | INTRAVENOUS | Status: AC
  Administered 2014-04-17: 3.375 g via INTRAVENOUS
  Filled 2014-04-17: qty 50

## 2014-04-17 MED ORDER — HEPARIN SODIUM (PORCINE) 5000 UNIT/ML IJ SOLN
5000.0000 [IU] | Freq: Three times a day (TID) | INTRAMUSCULAR | Status: DC
  Administered 2014-04-17 – 2014-04-18 (×2): 5000 [IU] via SUBCUTANEOUS
  Filled 2014-04-17 (×4): qty 1

## 2014-04-17 MED ORDER — ARTIFICIAL TEARS OP OINT
TOPICAL_OINTMENT | OPHTHALMIC | Status: DC | PRN
  Administered 2014-04-17: 1 via OPHTHALMIC

## 2014-04-17 MED ORDER — FENTANYL CITRATE 0.05 MG/ML IJ SOLN
INTRAMUSCULAR | Status: DC | PRN
  Administered 2014-04-17: 100 ug via INTRAVENOUS
  Administered 2014-04-17 (×3): 50 ug via INTRAVENOUS

## 2014-04-17 MED ORDER — PIPERACILLIN-TAZOBACTAM 3.375 G IVPB
3.3750 g | Freq: Three times a day (TID) | INTRAVENOUS | Status: DC
  Administered 2014-04-17: 3.375 g via INTRAVENOUS
  Filled 2014-04-17 (×5): qty 50

## 2014-04-17 MED ORDER — LACTATED RINGERS IV SOLN
INTRAVENOUS | Status: DC | PRN
  Administered 2014-04-17 (×2): via INTRAVENOUS

## 2014-04-17 MED ORDER — AMLODIPINE BESYLATE 5 MG PO TABS
5.0000 mg | ORAL_TABLET | Freq: Every day | ORAL | Status: DC
  Administered 2014-04-17 – 2014-04-18 (×2): 5 mg via ORAL
  Filled 2014-04-17 (×3): qty 1

## 2014-04-17 MED ORDER — FENTANYL CITRATE 0.05 MG/ML IJ SOLN
INTRAMUSCULAR | Status: AC
  Filled 2014-04-17: qty 5

## 2014-04-17 MED ORDER — HYDROMORPHONE HCL 1 MG/ML IJ SOLN
1.0000 mg | INTRAMUSCULAR | Status: DC | PRN
  Administered 2014-04-17: 1 mg via INTRAVENOUS
  Filled 2014-04-17 (×2): qty 1

## 2014-04-17 MED ORDER — INSULIN ASPART 100 UNIT/ML ~~LOC~~ SOLN
0.0000 [IU] | Freq: Three times a day (TID) | SUBCUTANEOUS | Status: DC
  Administered 2014-04-17: 5 [IU] via SUBCUTANEOUS
  Administered 2014-04-17 – 2014-04-18 (×2): 3 [IU] via SUBCUTANEOUS

## 2014-04-17 MED ORDER — SODIUM CHLORIDE 0.9 % IR SOLN
Status: DC | PRN
  Administered 2014-04-17: 1000 mL

## 2014-04-17 MED ORDER — VITAMIN B-1 100 MG PO TABS
100.0000 mg | ORAL_TABLET | Freq: Every day | ORAL | Status: DC
  Administered 2014-04-18: 100 mg via ORAL
  Filled 2014-04-17 (×3): qty 1

## 2014-04-17 MED ORDER — FENTANYL CITRATE 0.05 MG/ML IJ SOLN
25.0000 ug | INTRAMUSCULAR | Status: DC | PRN
  Administered 2014-04-17: 50 ug via INTRAVENOUS

## 2014-04-17 MED ORDER — PROPOFOL 10 MG/ML IV BOLUS
INTRAVENOUS | Status: AC
  Filled 2014-04-17: qty 20

## 2014-04-17 MED ORDER — GLYCOPYRROLATE 0.2 MG/ML IJ SOLN
INTRAMUSCULAR | Status: DC | PRN
  Administered 2014-04-17: 0.6 mg via INTRAVENOUS

## 2014-04-17 MED ORDER — FENTANYL CITRATE 0.05 MG/ML IJ SOLN
INTRAMUSCULAR | Status: AC
  Filled 2014-04-17: qty 2

## 2014-04-17 MED ORDER — PNEUMOCOCCAL 13-VAL CONJ VACC IM SUSP
0.5000 mL | Freq: Once | INTRAMUSCULAR | Status: DC
  Filled 2014-04-17: qty 0.5

## 2014-04-17 MED ORDER — LABETALOL HCL 5 MG/ML IV SOLN
INTRAVENOUS | Status: AC
  Filled 2014-04-17: qty 4

## 2014-04-17 MED ORDER — DEXAMETHASONE SODIUM PHOSPHATE 4 MG/ML IJ SOLN
INTRAMUSCULAR | Status: AC
  Filled 2014-04-17: qty 1

## 2014-04-17 MED ORDER — ONDANSETRON HCL 4 MG/2ML IJ SOLN
INTRAMUSCULAR | Status: DC | PRN
  Administered 2014-04-17: 4 mg via INTRAVENOUS

## 2014-04-17 SURGICAL SUPPLY — 47 items
APPLIER CLIP ROT 10 11.4 M/L (STAPLE) ×2
BLADE SURG ROTATE 9660 (MISCELLANEOUS) ×2 IMPLANT
CANISTER SUCTION 2500CC (MISCELLANEOUS) ×2 IMPLANT
CHLORAPREP W/TINT 26ML (MISCELLANEOUS) ×2 IMPLANT
CLIP APPLIE ROT 10 11.4 M/L (STAPLE) ×1 IMPLANT
COVER MAYO STAND STRL (DRAPES) ×2 IMPLANT
COVER SURGICAL LIGHT HANDLE (MISCELLANEOUS) ×2 IMPLANT
DRAPE C-ARM 42X72 X-RAY (DRAPES) ×2 IMPLANT
DRAPE LAPAROSCOPIC ABDOMINAL (DRAPES) ×2 IMPLANT
DRAPE WARM FLUID 44X44 (DRAPE) ×2 IMPLANT
ELECT REM PT RETURN 9FT ADLT (ELECTROSURGICAL) ×2
ELECTRODE REM PT RTRN 9FT ADLT (ELECTROSURGICAL) ×1 IMPLANT
GLOVE BIO SURGEON STRL SZ7.5 (GLOVE) ×4 IMPLANT
GLOVE BIO SURGEON STRL SZ8 (GLOVE) ×2 IMPLANT
GLOVE BIOGEL PI IND STRL 6.5 (GLOVE) ×1 IMPLANT
GLOVE BIOGEL PI IND STRL 7.0 (GLOVE) ×3 IMPLANT
GLOVE BIOGEL PI IND STRL 7.5 (GLOVE) ×1 IMPLANT
GLOVE BIOGEL PI IND STRL 8 (GLOVE) ×1 IMPLANT
GLOVE BIOGEL PI INDICATOR 6.5 (GLOVE) ×1
GLOVE BIOGEL PI INDICATOR 7.0 (GLOVE) ×3
GLOVE BIOGEL PI INDICATOR 7.5 (GLOVE) ×1
GLOVE BIOGEL PI INDICATOR 8 (GLOVE) ×1
GLOVE SURG SS PI 6.5 STRL IVOR (GLOVE) ×2 IMPLANT
GLOVE SURG SS PI 7.0 STRL IVOR (GLOVE) ×2 IMPLANT
GOWN STRL REUS W/ TWL LRG LVL3 (GOWN DISPOSABLE) ×2 IMPLANT
GOWN STRL REUS W/ TWL XL LVL3 (GOWN DISPOSABLE) ×1 IMPLANT
GOWN STRL REUS W/TWL LRG LVL3 (GOWN DISPOSABLE) ×2
GOWN STRL REUS W/TWL XL LVL3 (GOWN DISPOSABLE) ×1
KIT BASIN OR (CUSTOM PROCEDURE TRAY) ×2 IMPLANT
KIT ROOM TURNOVER OR (KITS) ×2 IMPLANT
LIQUID BAND (GAUZE/BANDAGES/DRESSINGS) ×2 IMPLANT
NS IRRIG 1000ML POUR BTL (IV SOLUTION) ×2 IMPLANT
PAD ARMBOARD 7.5X6 YLW CONV (MISCELLANEOUS) ×2 IMPLANT
POUCH SPECIMEN RETRIEVAL 10MM (ENDOMECHANICALS) ×2 IMPLANT
SCISSORS LAP 5X35 DISP (ENDOMECHANICALS) ×2 IMPLANT
SET CHOLANGIOGRAPH 5 50 .035 (SET/KITS/TRAYS/PACK) ×2 IMPLANT
SET IRRIG TUBING LAPAROSCOPIC (IRRIGATION / IRRIGATOR) ×2 IMPLANT
SLEEVE ENDOPATH XCEL 5M (ENDOMECHANICALS) ×2 IMPLANT
SPECIMEN JAR SMALL (MISCELLANEOUS) ×2 IMPLANT
SUT MNCRL AB 4-0 PS2 18 (SUTURE) ×2 IMPLANT
TOWEL OR 17X24 6PK STRL BLUE (TOWEL DISPOSABLE) ×2 IMPLANT
TOWEL OR 17X26 10 PK STRL BLUE (TOWEL DISPOSABLE) ×2 IMPLANT
TRAY LAPAROSCOPIC (CUSTOM PROCEDURE TRAY) ×2 IMPLANT
TROCAR XCEL BLUNT TIP 100MML (ENDOMECHANICALS) ×2 IMPLANT
TROCAR XCEL NON-BLD 11X100MML (ENDOMECHANICALS) ×2 IMPLANT
TROCAR XCEL NON-BLD 5MMX100MML (ENDOMECHANICALS) ×2 IMPLANT
TUBING INSUFFLATION (TUBING) ×2 IMPLANT

## 2014-04-17 NOTE — Anesthesia Postprocedure Evaluation (Signed)
  Anesthesia Post-op Note  Patient: Wesley Garcia  Procedure(s) Performed: Procedure(s): LAPAROSCOPIC CHOLECYSTECTOMY WITH INTRAOPERATIVE CHOLANGIOGRAM (N/A)  Patient Location: PACU  Anesthesia Type: General   Level of Consciousness: awake, alert  and oriented  Airway and Oxygen Therapy: Patient Spontanous Breathing  Post-op Pain: mild  Post-op Assessment: Post-op Vital signs reviewed  Post-op Vital Signs: Reviewed  Last Vitals:  Filed Vitals:   04/17/14 1445  BP:   Pulse: 68  Temp:   Resp: 16    Complications: No apparent anesthesia complications

## 2014-04-17 NOTE — H&P (Signed)
[facility] History and Physical  Wesley Garcia [id number] DOB: 1929/05/02 DOA: 04/16/2014  Referring physician: Alfonzo Beers, MD PCP: Hollace Kinnier, DO   Chief Complaint: RUQ pain  HPI: Wesley Garcia is a 81 y.o. male presents with RUQ pain. Patient state that the pain started about 4pm. He states that he was not doing anything in particular. He had just eaten a chicken and which and was at a dance. The patient states that the pain did not seem to go anywhere else. He does note that the pain is worse when he takes a deep breath under the right rib cage.  He had no nausea and no vomiting. No diarrhea is noted. He has no fever noted but feels cold. He states no sweating. Patient had a RUQ ultrasound done which shows positive murphy's sign and sludge in the gall bladder. In addition he has a history of HTN and DM   Review of Systems:  Complete 12 point ROS performed and is negative other than noted above in HPI  Past Medical History  Diagnosis Date  . Diabetes mellitus   . Chronic kidney disease (CKD), stage III (moderate)   . Squamous cell carcinoma of hard palate 2003  . Diverticulosis   . Hemorrhoids   . Hx of adenomatous colonic polyps 2006  . Hard of hearing   . Hypertension    Past Surgical History  Procedure Laterality Date  . Maxillectomy Right 2003    SCCA alveolar ridge  . Colonoscopy  11/02/2007   Social History:  reports that he quit smoking about 14 years ago. His smoking use included Cigars. He has never used smokeless tobacco. He reports that he drinks alcohol. He reports that he does not use illicit drugs.  No Known Allergies  Family History  Problem Relation Age of Onset  . Cancer Mother     ?  Marland Kitchen Bone cancer Sister   . Diabetes Brother   . Kidney disease Sister   . Colon cancer Neg Hx      Prior to Admission medications   Medication Sig Start Date End Date Taking? Authorizing Provider  amLODipine (NORVASC) 5 MG tablet Take 1 tablet (5 mg total) by  mouth daily. 02/09/14  Yes Tiffany L Reed, DO  Insulin Detemir (LEVEMIR FLEXTOUCH) 100 UNIT/ML Pen Inject 15 Units into the skin daily. 03/30/14  Yes Tiffany L Reed, DO  insulin lispro (HUMALOG KWIKPEN) 100 UNIT/ML KiwkPen Inject into the skin 2 (two) times daily. 0 units at breakfast, 3 units at lunch, 7 units at supper Patient taking differently: Inject 3-7 Units into the skin 2 (two) times daily. 3 units at lunch, 7 units at supper 02/09/14  Yes Tiffany L Reed, DO  AMBULATORY NON FORMULARY MEDICATION Sure Comfort Pen Needles 31Gx3/16" Use as Directed to inject insulin to control blood sugar Dx 250.00 12/03/13   Mahima Pandey, MD  glucose blood test strip One Touch Ultra test strip used to test blood sugar three times daily. Dx: 250.00 05/06/13   Estill Dooms, MD   Physical Exam: Filed Vitals:   04/16/14 2230 04/16/14 2325 04/16/14 2330 04/17/14 0000  BP: 199/75 217/84 192/81 205/79  Pulse: 64 63 65 64  Temp:      TempSrc:      Resp: 21 19 19 19   Height:      Weight:      SpO2: 100% 100% 100% 100%    Wt Readings from Last 3 Encounters:  04/16/14 72.576 kg (160 lb)  02/23/14  73.483 kg (162 lb)  02/09/14 73.483 kg (162 lb)    General:  Appears calm and comfortable Eyes: PERRL, normal lids, irises & conjunctiva ENT: grossly normal hearing, lips & tongue Neck: no LAD, masses or thyromegaly Cardiovascular: RRR, no m/r/g. No LE edema. Respiratory: CTA bilaterally, no w/r/r. Normal respiratory effort. Abdomen: ++RUQ tenderness Skin: no rash or induration seen on limited exam Musculoskeletal: grossly normal tone BUE/BLE Psychiatric: grossly normal mood and affect, speech fluent and appropriate Neurologic: grossly non-focal.          Labs on Admission:  Basic Metabolic Panel:  Recent Labs Lab 04/16/14 2040  NA 137  K 4.5  CL 105  CO2 23  GLUCOSE 199*  BUN 24*  CREATININE 2.11*  CALCIUM 9.4   Liver Function Tests:  Recent Labs Lab 04/16/14 2040  AST 21  ALT 18    ALKPHOS 63  BILITOT 0.6  PROT 7.4  ALBUMIN 3.8    Recent Labs Lab 04/16/14 2040  LIPASE 28   No results for input(s): AMMONIA in the last 168 hours. CBC:  Recent Labs Lab 04/16/14 2040  WBC 10.3  HGB 13.8  HCT 41.1  MCV 92.4  PLT 218   Cardiac Enzymes: No results for input(s): CKTOTAL, CKMB, CKMBINDEX, TROPONINI in the last 168 hours.  BNP (last 3 results) No results for input(s): BNP in the last 8760 hours.  ProBNP (last 3 results) No results for input(s): PROBNP in the last 8760 hours.  CBG: No results for input(s): GLUCAP in the last 168 hours.  Radiological Exams on Admission: Dg Ribs Unilateral W/chest Right  04/16/2014   CLINICAL DATA:  Right-sided anterior rib pain for 5 hours, no known injury, initial encounter  EXAM: RIGHT RIBS AND CHEST - 3+ VIEW  COMPARISON:  04/07/2013  FINDINGS: Cardiac shadow is stable. The lungs are clear bilaterally.No acute rib fracture is identified. The area of clinical abnormality corresponds to the anterior costal cartilage.  IMPRESSION: No acute rib fracture noted.   Electronically Signed   By: Inez Catalina M.D.   On: 04/16/2014 21:28   US Abdomen Limited  04/17/2014   CLINICAL DATA:  Right upper quadrant pain. History of diabetes, chronic renal disease, hypertension.  EXAM: US ABDOMEN LIMITED - RIGHT UPPER QUADRANT  COMPARISON:  CT abdomen and pelvis 08/04/2011  FINDINGS: Gallbladder:  Gallbladder is filled with echogenic sludge and small echogenic structures likely representing small stones. Largest stone measures 5 mm. Gallbladder wall is thickened, measuring up to 4.1 mm. Murphy's sign is positive. Changes are consistent with acute cholecystitis in the appropriate clinical setting.  Common bile duct:  Common bile duct is obscured by bowel gas. Right hepatic duct is visualized is borderline enlarged, measuring 6 mm.  Liver:  Diffusely increased hepatic parenchymal echotexture suggesting fatty infiltration.  IMPRESSION: Sludge and  stones in the gallbladder with gallbladder wall thickening and positive Murphy's sign. Changes are consistent with acute cholecystitis in the appropriate clinical setting.   Electronically Signed   By: Lucienne Capers M.D.   On: 04/17/2014 00:02     Assessment/Plan Active Problems:   RUQ pain   CKD (chronic kidney disease), stage III   Type II diabetes mellitus with complication   Acute cholecystitis   1. Acute Cholecystitis -surgery consult awaited -will start on zosyn now -may need surgical removal of Gall bladder  2. CKD III -monitor labs -will start on IVF  3. DM Type II -will check A1C --will Cover with sliding scale  4. HTN -  will continue with home medications -In the ED his pressure was elevated -will start nitropaste for now    Code Status: Full Code (must indicate code status--if unknown or must be presumed, indicate so) DVT Prophylaxis:Heparin Family Communication: None (indicate person spoken with, if applicable, with phone number if by telephone) Disposition Plan: Home (indicate anticipated LOS)  Time spent: 78min  Wesley Garcia A [facility] Pager [phone]

## 2014-04-17 NOTE — Transfer of Care (Signed)
Immediate Anesthesia Transfer of Care Note  Patient: Wesley Garcia  Procedure(s) Performed: Procedure(s): LAPAROSCOPIC CHOLECYSTECTOMY WITH INTRAOPERATIVE CHOLANGIOGRAM (N/A)  Patient Location: PACU  Anesthesia Type:General  Level of Consciousness: awake, alert , oriented and patient cooperative  Airway & Oxygen Therapy: Patient Spontanous Breathing and Patient connected to face mask oxygen  Post-op Assessment: Report given to RN, Post -op Vital signs reviewed and stable and Patient moving all extremities X 4  Post vital signs: Reviewed and stable  Last Vitals:  Filed Vitals:   04/17/14 1330  BP: 153/65  Pulse: 69  Temp: 36.6 C  Resp: 23    Complications: No apparent anesthesia complications

## 2014-04-17 NOTE — ED Notes (Signed)
Wife's Phone # [phone]

## 2014-04-17 NOTE — H&P (View-Only) (Signed)
Patient ID: Wesley Garcia, male   DOB: Nov 22, 1929, 81 y.o.   MRN: [id number]    Subjective: Pt still with pain with movement, but controlled  Objective: Vital signs in last 24 hours: Temp:  [98.1 F (36.7 C)-98.9 F (37.2 C)] 98.9 F (37.2 C) (02/12 0519) Pulse Rate:  [63-113] 66 (02/12 0519) Resp:  [14-21] 18 (02/12 0519) BP: (162-223)/(70-93) 162/70 mmHg (02/12 0519) SpO2:  [97 %-100 %] 97 % (02/12 0519) Weight:  [157 lb 14.4 oz (71.623 kg)-160 lb (72.576 kg)] 157 lb 14.4 oz (71.623 kg) (02/12 0136) Last BM Date: 04/16/14  Intake/Output from previous day: 02/11 0701 - 02/12 0700 In: 229 [I.V.:229] Out: 1 [Urine:1] Intake/Output this shift:    PE: Abd: soft, mildly tender in RUQ, +BS, ND  Lab Results:   Recent Labs  04/16/14 2040 04/17/14 0711  WBC 10.3 8.0  HGB 13.8 12.7*  HCT 41.1 38.0*  PLT 218 193   BMET  Recent Labs  04/16/14 2040 04/17/14 0711  NA 137 137  K 4.5 4.0  CL 105 106  CO2 23 24  GLUCOSE 199* 167*  BUN 24* 21  CREATININE 2.11* 1.94*  CALCIUM 9.4 8.7   PT/INR No results for input(s): LABPROT, INR in the last 72 hours. CMP     Component Value Date/Time   NA 137 04/17/2014 0711   NA 139 01/12/2014 0954   K 4.0 04/17/2014 0711   CL 106 04/17/2014 0711   CO2 24 04/17/2014 0711   GLUCOSE 167* 04/17/2014 0711   GLUCOSE 280* 01/12/2014 0954   BUN 21 04/17/2014 0711   BUN 25 01/12/2014 0954   CREATININE 1.94* 04/17/2014 0711   CALCIUM 8.7 04/17/2014 0711   PROT 6.7 04/17/2014 0711   PROT 6.6 01/12/2014 0954   ALBUMIN 3.1* 04/17/2014 0711   AST 19 04/17/2014 0711   ALT 17 04/17/2014 0711   ALKPHOS 56 04/17/2014 0711   BILITOT 1.3* 04/17/2014 0711   GFRNONAA 30* 04/17/2014 0711   GFRAA 35* 04/17/2014 0711   Lipase     Component Value Date/Time   LIPASE 28 04/16/2014 2040       Studies/Results: Dg Ribs Unilateral W/chest Right  04/16/2014   CLINICAL DATA:  Right-sided anterior rib pain for 5 hours, no known injury, initial  encounter  EXAM: RIGHT RIBS AND CHEST - 3+ VIEW  COMPARISON:  04/07/2013  FINDINGS: Cardiac shadow is stable. The lungs are clear bilaterally.No acute rib fracture is identified. The area of clinical abnormality corresponds to the anterior costal cartilage.  IMPRESSION: No acute rib fracture noted.   Electronically Signed   By: Inez Catalina M.D.   On: 04/16/2014 21:28   US Abdomen Limited  04/17/2014   CLINICAL DATA:  Right upper quadrant pain. History of diabetes, chronic renal disease, hypertension.  EXAM: US ABDOMEN LIMITED - RIGHT UPPER QUADRANT  COMPARISON:  CT abdomen and pelvis 08/04/2011  FINDINGS: Gallbladder:  Gallbladder is filled with echogenic sludge and small echogenic structures likely representing small stones. Largest stone measures 5 mm. Gallbladder wall is thickened, measuring up to 4.1 mm. Murphy's sign is positive. Changes are consistent with acute cholecystitis in the appropriate clinical setting.  Common bile duct:  Common bile duct is obscured by bowel gas. Right hepatic duct is visualized is borderline enlarged, measuring 6 mm.  Liver:  Diffusely increased hepatic parenchymal echotexture suggesting fatty infiltration.  IMPRESSION: Sludge and stones in the gallbladder with gallbladder wall thickening and positive Murphy's sign. Changes are consistent with acute cholecystitis  in the appropriate clinical setting.   Electronically Signed   By: Lucienne Capers M.D.   On: 04/17/2014 00:02    Anti-infectives: Anti-infectives    Start     Dose/Rate Route Frequency Ordered Stop   04/17/14 0800  piperacillin-tazobactam (ZOSYN) IVPB 3.375 g     3.375 g 12.5 mL/hr over 240 Minutes Intravenous Every 8 hours 04/17/14 0116     04/17/14 0130  piperacillin-tazobactam (ZOSYN) IVPB 3.375 g     3.375 g 100 mL/hr over 30 Minutes Intravenous  Once 04/17/14 0116 04/17/14 0247       Assessment/Plan  1. Acute cholecystitis  Plan: 1. Cont NPO and abx therapy.  Will go to OR today for lap  chole.  LOS: 0 days    Manahil Vanzile E 04/17/2014, 9:07 AM Pager: [phone]

## 2014-04-17 NOTE — Progress Notes (Signed)
Patient ID: Wesley Garcia, male   DOB: April 16, 1929, 81 y.o.   MRN: [id number]    Subjective: Pt still with pain with movement, but controlled  Objective: Vital signs in last 24 hours: Temp:  [98.1 F (36.7 C)-98.9 F (37.2 C)] 98.9 F (37.2 C) (02/12 0519) Pulse Rate:  [63-113] 66 (02/12 0519) Resp:  [14-21] 18 (02/12 0519) BP: (162-223)/(70-93) 162/70 mmHg (02/12 0519) SpO2:  [97 %-100 %] 97 % (02/12 0519) Weight:  [157 lb 14.4 oz (71.623 kg)-160 lb (72.576 kg)] 157 lb 14.4 oz (71.623 kg) (02/12 0136) Last BM Date: 04/16/14  Intake/Output from previous day: 02/11 0701 - 02/12 0700 In: 229 [I.V.:229] Out: 1 [Urine:1] Intake/Output this shift:    PE: Abd: soft, mildly tender in RUQ, +BS, ND  Lab Results:   Recent Labs  04/16/14 2040 04/17/14 0711  WBC 10.3 8.0  HGB 13.8 12.7*  HCT 41.1 38.0*  PLT 218 193   BMET  Recent Labs  04/16/14 2040 04/17/14 0711  NA 137 137  K 4.5 4.0  CL 105 106  CO2 23 24  GLUCOSE 199* 167*  BUN 24* 21  CREATININE 2.11* 1.94*  CALCIUM 9.4 8.7   PT/INR No results for input(s): LABPROT, INR in the last 72 hours. CMP     Component Value Date/Time   NA 137 04/17/2014 0711   NA 139 01/12/2014 0954   K 4.0 04/17/2014 0711   CL 106 04/17/2014 0711   CO2 24 04/17/2014 0711   GLUCOSE 167* 04/17/2014 0711   GLUCOSE 280* 01/12/2014 0954   BUN 21 04/17/2014 0711   BUN 25 01/12/2014 0954   CREATININE 1.94* 04/17/2014 0711   CALCIUM 8.7 04/17/2014 0711   PROT 6.7 04/17/2014 0711   PROT 6.6 01/12/2014 0954   ALBUMIN 3.1* 04/17/2014 0711   AST 19 04/17/2014 0711   ALT 17 04/17/2014 0711   ALKPHOS 56 04/17/2014 0711   BILITOT 1.3* 04/17/2014 0711   GFRNONAA 30* 04/17/2014 0711   GFRAA 35* 04/17/2014 0711   Lipase     Component Value Date/Time   LIPASE 28 04/16/2014 2040       Studies/Results: Dg Ribs Unilateral W/chest Right  04/16/2014   CLINICAL DATA:  Right-sided anterior rib pain for 5 hours, no known injury, initial  encounter  EXAM: RIGHT RIBS AND CHEST - 3+ VIEW  COMPARISON:  04/07/2013  FINDINGS: Cardiac shadow is stable. The lungs are clear bilaterally.No acute rib fracture is identified. The area of clinical abnormality corresponds to the anterior costal cartilage.  IMPRESSION: No acute rib fracture noted.   Electronically Signed   By: Inez Catalina M.D.   On: 04/16/2014 21:28   US Abdomen Limited  04/17/2014   CLINICAL DATA:  Right upper quadrant pain. History of diabetes, chronic renal disease, hypertension.  EXAM: US ABDOMEN LIMITED - RIGHT UPPER QUADRANT  COMPARISON:  CT abdomen and pelvis 08/04/2011  FINDINGS: Gallbladder:  Gallbladder is filled with echogenic sludge and small echogenic structures likely representing small stones. Largest stone measures 5 mm. Gallbladder wall is thickened, measuring up to 4.1 mm. Murphy's sign is positive. Changes are consistent with acute cholecystitis in the appropriate clinical setting.  Common bile duct:  Common bile duct is obscured by bowel gas. Right hepatic duct is visualized is borderline enlarged, measuring 6 mm.  Liver:  Diffusely increased hepatic parenchymal echotexture suggesting fatty infiltration.  IMPRESSION: Sludge and stones in the gallbladder with gallbladder wall thickening and positive Murphy's sign. Changes are consistent with acute cholecystitis  in the appropriate clinical setting.   Electronically Signed   By: Lucienne Capers M.D.   On: 04/17/2014 00:02    Anti-infectives: Anti-infectives    Start     Dose/Rate Route Frequency Ordered Stop   04/17/14 0800  piperacillin-tazobactam (ZOSYN) IVPB 3.375 g     3.375 g 12.5 mL/hr over 240 Minutes Intravenous Every 8 hours 04/17/14 0116     04/17/14 0130  piperacillin-tazobactam (ZOSYN) IVPB 3.375 g     3.375 g 100 mL/hr over 30 Minutes Intravenous  Once 04/17/14 0116 04/17/14 0247       Assessment/Plan  1. Acute cholecystitis  Plan: 1. Cont NPO and abx therapy.  Will go to OR today for lap  chole.  LOS: 0 days    Sartaj Hoskin E 04/17/2014, 9:07 AM Pager: [phone]

## 2014-04-17 NOTE — ED Notes (Signed)
1 attempt to call report to floor; RN to call back  

## 2014-04-17 NOTE — Anesthesia Preprocedure Evaluation (Addendum)
Anesthesia Evaluation  Patient identified by MRN, date of birth, ID band Patient awake    Reviewed: Allergy & Precautions, NPO status , Patient's Chart, lab work & pertinent test results, reviewed documented beta blocker date and time   Airway Mallampati: II  TM Distance: >3 FB Neck ROM: Full    Dental  (+) Edentulous Lower, Edentulous Upper Cleft palate from previous cancer surgery:   Pulmonary former smoker,  breath sounds clear to auscultation  Pulmonary exam normal       Cardiovascular hypertension, Pt. on medications negative cardio ROS  Rhythm:Regular Rate:Normal     Neuro/Psych negative neurological ROS  negative psych ROS   GI/Hepatic Neg liver ROS, Acute Cholecystitis   Endo/Other  diabetes, Poorly Controlled, Type 2, Insulin Dependent  Renal/GU Renal InsufficiencyRenal disease  negative genitourinary   Musculoskeletal negative musculoskeletal ROS (+)   Abdominal (+)  Abdomen: soft and tender.    Peds  Hematology   Anesthesia Other Findings   Reproductive/Obstetrics negative OB ROS                            Anesthesia Physical Anesthesia Plan  ASA: III  Anesthesia Plan: General   Post-op Pain Management:    Induction: Intravenous, Rapid sequence and Cricoid pressure planned  Airway Management Planned: Oral ETT  Additional Equipment:   Intra-op Plan:   Post-operative Plan: Extubation in OR  Informed Consent: I have reviewed the patients History and Physical, chart, labs and discussed the procedure including the risks, benefits and alternatives for the proposed anesthesia with the patient or authorized representative who has indicated his/her understanding and acceptance.   Dental advisory given  Plan Discussed with: CRNA, Anesthesiologist and Surgeon  Anesthesia Plan Comments:         Anesthesia Quick Evaluation

## 2014-04-17 NOTE — Op Note (Signed)
Laparoscopic Cholecystectomy with IOC Procedure Note  Indications: This patient presents with symptomatic gallbladder disease and will undergo laparoscopic cholecystectomy.The procedure has been discussed with the patient. Operative and non operative treatments have been discussed. Risks of surgery include bleeding, infection,  Common bile duct injury,  Injury to the stomach,liver, colon,small intestine, abdominal wall,  Diaphragm,  Major blood vessels,  And the need for an open procedure.  Other risks include worsening of medical problems, death,  DVT and pulmonary embolism, and cardiovascular events.   Medical options have also been discussed. The patient has been informed of long term expectations of surgery and non surgical options,  The patient agrees to proceed.    Pre-operative Diagnosis: Calculus of gallbladder with acute cholecystitis, without mention of obstruction  Post-operative Diagnosis: Calculus of gallbladder with acute cholecystitis, without mention of obstruction  Surgeon: Tashica Provencio A.   Assistants: OR staff  Anesthesia: General endotracheal anesthesia and Local anesthesia 0.25.% bupivacaine, with epinephrine  ASA Class: 3  Procedure Details  The patient was seen again in the Holding Room. The risks, benefits, complications, treatment options, and expected outcomes were discussed with the patient. The possibilities of reaction to medication, pulmonary aspiration, perforation of viscus, bleeding, recurrent infection, finding a normal gallbladder, the need for additional procedures, failure to diagnose a condition, the possible need to convert to an open procedure, and creating a complication requiring transfusion or operation were discussed with the patient. The patient and/or family concurred with the proposed plan, giving informed consent. The site of surgery properly noted/marked. The patient was taken to Operating Room, identified as Wesley Garcia and the procedure verified as  Laparoscopic Cholecystectomy with Intraoperative Cholangiograms. A Time Out was held and the above information confirmed.  Prior to the induction of general anesthesia, antibiotic prophylaxis was administered. General endotracheal anesthesia was then administered and tolerated well. After the induction, the abdomen was prepped in the usual sterile fashion. The patient was positioned in the supine position with the left arm comfortably tucked, along with some reverse Trendelenburg.  Local anesthetic agent was injected into the skin near the umbilicus and an incision made. The midline fascia was incised and the Hasson technique was used to introduce a 12 mm port under direct vision. It was secured with a figure of eight Vicryl suture placed in the usual fashion. Pneumoperitoneum was then created with CO2 and tolerated well without any adverse changes in the patient's vital signs. Additional trocars were introduced under direct vision with an 11 mm trocar in the epigastrium and 2 5 mm trocars in the right upper quadrant. All skin incisions were infiltrated with a local anesthetic agent before making the incision and placing the trocars.   The gallbladder was identified, the fundus grasped and retracted cephalad. Adhesions were lysed bluntly and with the electrocautery where indicated, taking care not to injure any adjacent organs or viscus. The infundibulum was grasped and retracted laterally, exposing the peritoneum overlying the triangle of Calot. This was then divided and exposed in a blunt fashion. The cystic duct was clearly identified and bluntly dissected circumferentially. The junctions of the gallbladder, cystic duct and common bile duct were clearly identified prior to the division of any linear structure.   An incision was made in the cystic duct and the cholangiogram catheter introduced. The catheter was secured using an endoclip. The study showed no stones and good visualization of the distal and  proximal biliary tree. The catheter was then removed.   The cystic duct was then  ligated  with surgical clips  on the patient side and  clipped on the gallbladder side and divided. The cystic artery was identified, dissected free, ligated with clips and divided as well. Posterior cystic artery clipped and divided.  The gallbladder was dissected from the liver bed in retrograde fashion with the electrocautery. The gallbladder was removed. The liver bed was irrigated and inspected. Hemostasis was achieved with the electrocautery. Copious irrigation was utilized and was repeatedly aspirated until clear all particulate matter. Hemostasis was achieved with no signs  Of bleeding or bile leakage.  Pneumoperitoneum was completely reduced after viewing removal of the trocars under direct vision. The wound was thoroughly irrigated and the fascia was then closed with a figure of eight suture; the skin was then closed with 4 0 monocryl  and a sterile dressing was applied.  Instrument, sponge, and needle counts were correct at closure and at the conclusion of the case.   Findings: Cholecystitis with Cholelithiasis  Estimated Blood Loss: Minimal         Drains: none         Total IV Fluids: 8000 mL         Specimens: Gallbladder           Complications: None; patient tolerated the procedure well.         Disposition: PACU - hemodynamically stable.         Condition: stable

## 2014-04-17 NOTE — Interval H&P Note (Signed)
History and Physical Interval Note:  04/17/2014 11:35 AM  Wesley Garcia  has presented today for surgery, with the diagnosis of ACUTE CHOLECYSTITIS  The various methods of treatment have been discussed with the patient and family. After consideration of risks, benefits and other options for treatment, the patient has consented to  Procedure(s): LAPAROSCOPIC CHOLECYSTECTOMY WITH INTRAOPERATIVE CHOLANGIOGRAM (N/A) as a surgical intervention .  The patient's history has been reviewed, patient examined, no change in status, stable for surgery.  I have reviewed the patient's chart and labs.  Questions were answered to the patient's satisfaction.     Loney Peto A.

## 2014-04-17 NOTE — Consult Note (Signed)
Reason for Consult:abd pain Referring Physician: Dr Wardell Wesley Garcia is an 81 y.o. male.  HPI: 63 yom who presents with less than 24 hour onset of ruq pain. This is worse with moving or deep breaths. No prior history, no n/v/fevers, no change in bowel movements.  He has undergone [place] that shows stones/sludge and concerning for cholecystitis.  Past Medical History  Diagnosis Date  . Diabetes mellitus   . Chronic kidney disease (CKD), stage III (moderate)   . Squamous cell carcinoma of hard palate 2003  . Diverticulosis   . Hemorrhoids   . Hx of adenomatous colonic polyps 2006  . Hard of hearing   . Hypertension     Past Surgical History  Procedure Laterality Date  . Maxillectomy Right 2003    SCCA alveolar ridge  . Colonoscopy  11/02/2007    Family History  Problem Relation Age of Onset  . Cancer Mother     ?  Marland Kitchen Bone cancer Sister   . Diabetes Brother   . Kidney disease Sister   . Colon cancer Neg Hx     Social History:  reports that he quit smoking about 14 years ago. His smoking use included Cigars. He has never used smokeless tobacco. He reports that he drinks alcohol. He reports that he does not use illicit drugs.  Allergies: No Known Allergies  Medications: I have reviewed the patient's current medications.  Results for orders placed or performed during the hospital encounter of 04/16/14 (from the past 48 hour(s))  CBC     Status: None   Collection Time: 04/16/14  8:40 PM  Result Value Ref Range   WBC 10.3 4.0 - 10.5 K/uL   RBC 4.45 4.22 - 5.81 MIL/uL   Hemoglobin 13.8 13.0 - 17.0 g/dL   HCT 41.1 39.0 - 52.0 %   MCV 92.4 78.0 - 100.0 fL   MCH 31.0 26.0 - 34.0 pg   MCHC 33.6 30.0 - 36.0 g/dL   RDW 13.1 11.5 - 15.5 %   Platelets 218 150 - 400 K/uL  Basic metabolic panel     Status: Abnormal   Collection Time: 04/16/14  8:40 PM  Result Value Ref Range   Sodium 137 135 - 145 mmol/L   Potassium 4.5 3.5 - 5.1 mmol/L   Chloride 105 96 - 112 mmol/L   CO2 23 19  - 32 mmol/L   Glucose, Bld 199 (H) 70 - 99 mg/dL   BUN 24 (H) 6 - 23 mg/dL   Creatinine, Ser 2.11 (H) 0.50 - 1.35 mg/dL   Calcium 9.4 8.4 - 10.5 mg/dL   GFR calc non Af Amer 28 (L) >90 mL/min   GFR calc Af Amer 32 (L) >90 mL/min    Comment: (NOTE) The eGFR has been calculated using the CKD EPI equation. This calculation has not been validated in all clinical situations. eGFR's persistently <90 mL/min signify possible Chronic Kidney Disease.    Anion gap 9 5 - 15  D-dimer, quantitative     Status: Abnormal   Collection Time: 04/16/14  8:40 PM  Result Value Ref Range   D-Dimer, Quant 1.43 (H) 0.00 - 0.48 ug/mL-FEU    Comment:        AT THE INHOUSE ESTABLISHED CUTOFF VALUE OF 0.48 ug/mL FEU, THIS ASSAY HAS BEEN DOCUMENTED IN THE LITERATURE TO HAVE A SENSITIVITY AND NEGATIVE PREDICTIVE VALUE OF AT LEAST 98 TO 99%.  THE TEST RESULT SHOULD BE CORRELATED WITH AN ASSESSMENT OF THE CLINICAL PROBABILITY  OF DVT / VTE.   Hepatic function panel     Status: None   Collection Time: 04/16/14  8:40 PM  Result Value Ref Range   Total Protein 7.4 6.0 - 8.3 g/dL   Albumin 3.8 3.5 - 5.2 g/dL   AST 21 0 - 37 U/L   ALT 18 0 - 53 U/L   Alkaline Phosphatase 63 39 - 117 U/L   Total Bilirubin 0.6 0.3 - 1.2 mg/dL   Bilirubin, Direct 0.2 0.0 - 0.5 mg/dL   Indirect Bilirubin 0.4 0.3 - 0.9 mg/dL  Lipase, blood     Status: None   Collection Time: 04/16/14  8:40 PM  Result Value Ref Range   Lipase 28 11 - 59 U/L  I-stat troponin, ED (not at [facility])     Status: None   Collection Time: 04/16/14  8:47 PM  Result Value Ref Range   Troponin i, poc 0.01 0.00 - 0.08 ng/mL   Comment 3            Comment: Due to the release kinetics of cTnI, a negative result within the first hours of the onset of symptoms does not rule out myocardial infarction with certainty. If myocardial infarction is still suspected, repeat the test at appropriate intervals.     Dg Ribs Unilateral W/chest Right  04/16/2014    CLINICAL DATA:  Right-sided anterior rib pain for 5 hours, no known injury, initial encounter  EXAM: RIGHT RIBS AND CHEST - 3+ VIEW  COMPARISON:  04/07/2013  FINDINGS: Cardiac shadow is stable. The lungs are clear bilaterally.No acute rib fracture is identified. The area of clinical abnormality corresponds to the anterior costal cartilage.  IMPRESSION: No acute rib fracture noted.   Electronically Signed   By: Inez Catalina M.D.   On: 04/16/2014 21:28   US Abdomen Limited  04/17/2014   CLINICAL DATA:  Right upper quadrant pain. History of diabetes, chronic renal disease, hypertension.  EXAM: US ABDOMEN LIMITED - RIGHT UPPER QUADRANT  COMPARISON:  CT abdomen and pelvis 08/04/2011  FINDINGS: Gallbladder:  Gallbladder is filled with echogenic sludge and small echogenic structures likely representing small stones. Largest stone measures 5 mm. Gallbladder wall is thickened, measuring up to 4.1 mm. Murphy's sign is positive. Changes are consistent with acute cholecystitis in the appropriate clinical setting.  Common bile duct:  Common bile duct is obscured by bowel gas. Right hepatic duct is visualized is borderline enlarged, measuring 6 mm.  Liver:  Diffusely increased hepatic parenchymal echotexture suggesting fatty infiltration.  IMPRESSION: Sludge and stones in the gallbladder with gallbladder wall thickening and positive Murphy's sign. Changes are consistent with acute cholecystitis in the appropriate clinical setting.   Electronically Signed   By: Lucienne Capers M.D.   On: 04/17/2014 00:02    Review of Systems  Constitutional: Negative for fever and chills.  Respiratory: Negative for cough and shortness of breath.   Cardiovascular: Negative for chest pain.  Gastrointestinal: Positive for abdominal pain. Negative for nausea, vomiting, diarrhea and constipation.   Blood pressure 212/77, pulse 71, temperature 98.1 F (36.7 C), temperature source Oral, resp. rate 14, height $RemoveBe'5\' 6"'ciahsBUOU$  (1.676 m), weight 160 lb  (72.576 kg), SpO2 100 %. Physical Exam  Constitutional: He is oriented to person, place, and time. He appears well-developed and well-nourished.  Eyes: No scleral icterus.  Neck: Neck supple.  Cardiovascular: Normal rate and regular rhythm.   Respiratory: Effort normal. He has no wheezes. He has no rales.  GI: Soft. Bowel sounds  are normal. He exhibits no distension. There is tenderness in the right upper quadrant. There is positive Murphy's sign. No hernia.  Lymphadenopathy:    He has no cervical adenopathy.  Neurological: He is alert and oriented to person, place, and time.    Assessment/Plan: Acute cholecystitis  Plan admission, iv abx, npo, lap chole likely tomorrow.  Raeden Schippers 04/17/2014, 1:06 AM

## 2014-04-17 NOTE — Progress Notes (Signed)
Report given to emily rn as caregiver 

## 2014-04-17 NOTE — Progress Notes (Signed)
81yo male c/o RUQ pain x24h, [place] concerning for cholecystitis, to begin IV ABX.  Will start Zosyn 3.375g IV Q8H for CrCl ~25 ml/min and monitor CBC and Cx.  Wynona Neat, PharmD, BCPS 04/17/2014 1:16 AM

## 2014-04-17 NOTE — Discharge Instructions (Signed)
[facility] ______[facility] °LAPAROSCOPIC SURGERY: POST OP INSTRUCTIONS °Always review your discharge instruction sheet given to you by the facility where your surgery was performed. °IF YOU HAVE DISABILITY OR FAMILY LEAVE FORMS, YOU MUST BRING THEM TO THE OFFICE FOR PROCESSING.   °DO NOT GIVE THEM TO YOUR DOCTOR. ° °1. A prescription for pain medication may be given to you upon discharge.  Take your pain medication as prescribed, if needed.  If narcotic pain medicine is not needed, then you may take acetaminophen (Tylenol) or ibuprofen (Advil) as needed. °2. Take your usually prescribed medications unless otherwise directed. °3. If you need a refill on your pain medication, please contact your pharmacy.  They will contact our office to request authorization. Prescriptions will not be filled after 5pm or on week-ends. °4. You should follow a light diet the first few days after arrival home, such as soup and crackers, etc.  Be sure to include lots of fluids daily. °5. Most patients will experience some swelling and bruising in the area of the incisions.  Ice packs will help.  Swelling and bruising can take several days to resolve.  °6. It is common to experience some constipation if taking pain medication after surgery.  Increasing fluid intake and taking a stool softener (such as Colace) will usually help or prevent this problem from occurring.  A mild laxative (Milk of Magnesia or Miralax) should be taken according to package instructions if there are no bowel movements after 48 hours. °7. Unless discharge instructions indicate otherwise, you may remove your bandages 24-48 hours after surgery, and you may shower at that time.  You may have steri-strips (small skin tapes) in place directly over the incision.  These strips should be left on the skin for 7-10 days.  If your surgeon used skin glue on the incision, you may shower in 24 hours.  The glue will flake off over the next 2-3 weeks.  Any sutures or  staples will be removed at the office during your follow-up visit. °8. ACTIVITIES:  You may resume regular (light) daily activities beginning the next day--such as daily self-care, walking, climbing stairs--gradually increasing activities as tolerated.  You may have sexual intercourse when it is comfortable.  Refrain from any heavy lifting or straining until approved by your doctor. °a. You may drive when you are no longer taking prescription pain medication, you can comfortably wear a seatbelt, and you can safely maneuver your car and apply brakes. °b. RETURN TO WORK:  __________________________________________________________ °9. You should see your doctor in the office for a follow-up appointment approximately 2-3 weeks after your surgery.  Make sure that you call for this appointment within a day or two after you arrive home to insure a convenient appointment time. °10. OTHER INSTRUCTIONS: __________________________________________________________________________________________________________________________ __________________________________________________________________________________________________________________________ °WHEN TO CALL YOUR DOCTOR: °1. Fever over 101.0 °2. Inability to urinate °3. Continued bleeding from incision. °4. Increased pain, redness, or drainage from the incision. °5. Increasing abdominal pain ° °The clinic staff is available to answer your questions during regular business hours.  Please don’t hesitate to call and ask to speak to one of the nurses for clinical concerns.  If you have a medical emergency, go to the nearest emergency room or call 911.  A surgeon from [facility] is always on call at the hospital. °[address], [address], [place], [place]  [address] ? [address] [address], [place],    [address] °[phone] ? [phone] ? FAX [phone] °Web site:   [email or web address] °

## 2014-04-17 NOTE — Progress Notes (Signed)
Utilization chart review completed.

## 2014-04-17 NOTE — Progress Notes (Signed)
Patient ID: Wesley Garcia  male  [id number]    DOB: 06/23/1929    DOA: 04/16/2014  PCP: Hollace Kinnier, DO  Brief history of present illness Wesley Garcia is a 81 y.o. male with diabetes, CKD stage III, hypertension presentedwith RUQ pain. Patient stated that the pain started about 4pm. He states that he was not doing anything in particular. He had just eaten a chicken and subsequently the pain started and was worse under the right rib cage and worse on taking a deep breath.  He had no nausea and no vomiting or diarrhea. He has no fever noted but felt cold. Patient had a RUQ ultrasound done which shows positive murphy's sign and sludge in the gall bladder.   Assessment/Plan: Principal Problem:  Right upper quadrant pain secondary to Acute cholecystitis -Patient was placed on nothing by mouth status IV fluids, IV Zosyn, Gen. surgery was consulted - Laparoscopic cholecystectomy plan today, we'll start on clear liquid diet after the surgery, advanced to solids as tolerated    Active Problems: Hypertension - Holding oral hypertensives due to surgery, continue IV hydralazine as needed    CKD (chronic kidney disease), stage III -Currently stable, creatinine at baseline     Type II diabetes mellitus with complication Continue sliding scale insulin-    DVT Prophylaxis: SCD  Code Status: full code   Family Communication: d/w wife at bed side  Disposition:  Consultants:  Gen surgery  Procedures:  Lap chole  Antibiotics:  IV zosyn    Subjective: Patient seen and examined, no fevers or chills abdominal pain controlled, was awaiting surgery at the time of my examination   Objective: Weight change:   Intake/Output Summary (Last 24 hours) at 04/17/14 1532 Last data filed at 04/17/14 1336  Gross per 24 hour  Intake   1229 ml  Output     21 ml  Net   1208 ml   Blood pressure 135/60, pulse 68, temperature 98 F (36.7 C), temperature source Oral, resp. rate 16, height 5\' 6"   (1.676 m), weight 71.623 kg (157 lb 14.4 oz), SpO2 100 %.  Physical Exam: General: Alert and awake, oriented x3, not in any acute distress. CVS: S1-S2 clear, no murmur rubs or gallops Chest: clear to auscultation bilaterally, no wheezing, rales or rhonchi Abdomen: soft , mild right upper quadrant TTP, nondistended, normal bowel sounds  Extremities: no cyanosis, clubbing or edema noted bilaterally Neuro: Cranial nerves II-XII intact, no focal neurological deficits  Lab Results: Basic Metabolic Panel:  Recent Labs Lab 04/16/14 2040 04/17/14 0711  NA 137 137  K 4.5 4.0  CL 105 106  CO2 23 24  GLUCOSE 199* 167*  BUN 24* 21  CREATININE 2.11* 1.94*  CALCIUM 9.4 8.7   Liver Function Tests:  Recent Labs Lab 04/16/14 2040 04/17/14 0711  AST 21 19  ALT 18 17  ALKPHOS 63 56  BILITOT 0.6 1.3*  PROT 7.4 6.7  ALBUMIN 3.8 3.1*    Recent Labs Lab 04/16/14 2040  LIPASE 28   No results for input(s): AMMONIA in the last 168 hours. CBC:  Recent Labs Lab 04/16/14 2040 04/17/14 0711  WBC 10.3 8.0  HGB 13.8 12.7*  HCT 41.1 38.0*  MCV 92.4 92.7  PLT 218 193   Cardiac Enzymes: No results for input(s): CKTOTAL, CKMB, CKMBINDEX, TROPONINI in the last 168 hours. BNP: Invalid input(s): POCBNP CBG:  Recent Labs Lab 04/17/14 0736 04/17/14 1121 04/17/14 1344  GLUCAP 172* 112* 163*     Micro  Results: Recent Results (from the past 240 hour(s))  MRSA PCR Screening     Status: None   Collection Time: 04/17/14  9:38 AM  Result Value Ref Range Status   MRSA by PCR NEGATIVE NEGATIVE Final    Comment:        The GeneXpert MRSA Assay (FDA approved for NASAL specimens only), is one component of a comprehensive MRSA colonization surveillance program. It is not intended to diagnose MRSA infection nor to guide or monitor treatment for MRSA infections.     Studies/Results: Dg Ribs Unilateral W/chest Right  04/16/2014   CLINICAL DATA:  Right-sided anterior rib pain for  5 hours, no known injury, initial encounter  EXAM: RIGHT RIBS AND CHEST - 3+ VIEW  COMPARISON:  04/07/2013  FINDINGS: Cardiac shadow is stable. The lungs are clear bilaterally.No acute rib fracture is identified. The area of clinical abnormality corresponds to the anterior costal cartilage.  IMPRESSION: No acute rib fracture noted.   Electronically Signed   By: Inez Catalina M.D.   On: 04/16/2014 21:28   Dg Cholangiogram Operative  04/17/2014   CLINICAL DATA:  Acute cholecystitis  EXAM: INTRAOPERATIVE CHOLANGIOGRAM  TECHNIQUE: Cholangiographic images from the C-arm fluoroscopic device were submitted for interpretation post-operatively. Please see the procedural report for the amount of contrast and the fluoroscopy time utilized.  COMPARISON:  Abdominal ultrasound 04/16/2014  FINDINGS: Cine loop and spot images obtained during intraoperative cholangiogram at the time of all laparoscopic cholecystectomy demonstrate cannulation of the cystic duct remnant and opacification of the intra and extrahepatic biliary tree. No evidence of biliary ductal dilatation, stenosis, stricture or filling defect. Contrast material passes freely through the ampulla and into the duodenum. There is a moderate extravasation of injected contrast material from the cystic duct remnant.  IMPRESSION: 1. Negative intraoperative cholangiogram. 2. Extravasation of injected contrast material from the cystic duct remnant.   Electronically Signed   By: Jacqulynn Cadet M.D.   On: 04/17/2014 13:29   US Abdomen Limited  04/17/2014   CLINICAL DATA:  Right upper quadrant pain. History of diabetes, chronic renal disease, hypertension.  EXAM: US ABDOMEN LIMITED - RIGHT UPPER QUADRANT  COMPARISON:  CT abdomen and pelvis 08/04/2011  FINDINGS: Gallbladder:  Gallbladder is filled with echogenic sludge and small echogenic structures likely representing small stones. Largest stone measures 5 mm. Gallbladder wall is thickened, measuring up to 4.1 mm. Murphy's  sign is positive. Changes are consistent with acute cholecystitis in the appropriate clinical setting.  Common bile duct:  Common bile duct is obscured by bowel gas. Right hepatic duct is visualized is borderline enlarged, measuring 6 mm.  Liver:  Diffusely increased hepatic parenchymal echotexture suggesting fatty infiltration.  IMPRESSION: Sludge and stones in the gallbladder with gallbladder wall thickening and positive Murphy's sign. Changes are consistent with acute cholecystitis in the appropriate clinical setting.   Electronically Signed   By: Lucienne Capers M.D.   On: 04/17/2014 00:02    Medications: Scheduled Meds: . amLODipine  5 mg Oral Daily  . fentaNYL      . folic acid  1 mg Oral Daily  . heparin  5,000 Units Subcutaneous 3 times per day  . insulin aspart  0-15 Units Subcutaneous TID WC  . piperacillin-tazobactam (ZOSYN)  IV  3.375 g Intravenous Q8H  . thiamine  100 mg Oral Daily   time spent 25 minutes    LOS: 0 days   Shelisha Gautier M.D. [facility] 04/17/2014, 3:32 PM Pager: [phone]  If 7PM-7AM, please contact  night-coverage [email or web address] Password [email or web address]

## 2014-04-18 LAB — HEMOGLOBIN A1C
HEMOGLOBIN A1C: 8.7 % — AB (ref 4.8–5.6)
Mean Plasma Glucose: 203 mg/dL

## 2014-04-18 LAB — GLUCOSE, CAPILLARY: Glucose-Capillary: 193 mg/dL — ABNORMAL HIGH (ref 70–99)

## 2014-04-18 MED ORDER — TAMSULOSIN HCL 0.4 MG PO CAPS
0.4000 mg | ORAL_CAPSULE | Freq: Every day | ORAL | Status: DC
  Administered 2014-04-18: 0.4 mg via ORAL
  Filled 2014-04-18: qty 1

## 2014-04-18 MED ORDER — OXYCODONE-ACETAMINOPHEN 5-325 MG PO TABS: 1.0000 | ORAL_TABLET | Freq: Four times a day (QID) | ORAL | Status: DC | PRN

## 2014-04-18 MED ORDER — CALCIUM CARBONATE ANTACID 500 MG PO CHEW
1.0000 | CHEWABLE_TABLET | Freq: Four times a day (QID) | ORAL | Status: DC | PRN
  Administered 2014-04-18: 200 mg via ORAL
  Filled 2014-04-18: qty 1

## 2014-04-18 MED ORDER — TAMSULOSIN HCL 0.4 MG PO CAPS: 0.4000 mg | ORAL_CAPSULE | Freq: Every day | ORAL | Status: DC

## 2014-04-18 MED ORDER — PROMETHAZINE HCL 12.5 MG PO TABS: 12.5000 mg | ORAL_TABLET | Freq: Four times a day (QID) | ORAL | Status: DC | PRN

## 2014-04-18 NOTE — Progress Notes (Signed)
Utilization Review completed.  

## 2014-04-18 NOTE — Progress Notes (Signed)
Patient ID: Wesley Garcia, male   DOB: Feb 22, 1930, 81 y.o.   MRN: [id number]     [address] [place], [place] [address]    Phone: [phone] FAX: [phone]     Subjective: BP elevated, on IVF.  Passing flatus, voiding, pain controlled, tolerating PO.   Objective:  Vital signs:  Filed Vitals:   04/17/14 1500 04/17/14 2129 04/18/14 0205 04/18/14 0502  BP: 138/79 128/67 148/59 161/62  Pulse: 73 73 79 89  Temp: 97.6 F (36.4 C) 97.8 F (36.6 C) 98.8 F (37.1 C) 98.9 F (37.2 C)  TempSrc: Oral Oral Oral Oral  Resp: 16 16 17 20   Height:      Weight:      SpO2: 100% 97% 97% 96%    Last BM Date: 04/17/14  Intake/Output   Yesterday:  02/12 0701 - 02/13 0700 In: 2048.8 [P.O.:120; I.V.:1928.8] Out: 520 [Urine:500; Blood:20] This shift:      Physical Exam: General: Pt awake/alert/oriented x4 in no acute distress Abdomen: Soft.  Nondistended.   Mildly tender at incisions only.  No evidence of peritonitis.  No incarcerated hernias.    Problem List:   Principal Problem:   Acute cholecystitis Active Problems:   RUQ pain   CKD (chronic kidney disease), stage III   Type II diabetes mellitus with complication   Gallstones    Results:   Labs: Results for orders placed or performed during the hospital encounter of 04/16/14 (from the past 48 hour(s))  CBC     Status: None   Collection Time: 04/16/14  8:40 PM  Result Value Ref Range   WBC 10.3 4.0 - 10.5 K/uL   RBC 4.45 4.22 - 5.81 MIL/uL   Hemoglobin 13.8 13.0 - 17.0 g/dL   HCT 41.1 39.0 - 52.0 %   MCV 92.4 78.0 - 100.0 fL   MCH 31.0 26.0 - 34.0 pg   MCHC 33.6 30.0 - 36.0 g/dL   RDW 13.1 11.5 - 15.5 %   Platelets 218 150 - 400 K/uL  Basic metabolic panel     Status: Abnormal   Collection Time: 04/16/14  8:40 PM  Result Value Ref Range   Sodium 137 135 - 145 mmol/L   Potassium 4.5 3.5 - 5.1 mmol/L   Chloride 105 96 - 112 mmol/L   CO2 23 19 -  32 mmol/L   Glucose, Bld 199 (H) 70 - 99 mg/dL   BUN 24 (H) 6 - 23 mg/dL   Creatinine, Ser 2.11 (H) 0.50 - 1.35 mg/dL   Calcium 9.4 8.4 - 10.5 mg/dL   GFR calc non Af Amer 28 (L) >90 mL/min   GFR calc Af Amer 32 (L) >90 mL/min    Comment: (NOTE) The eGFR has been calculated using the CKD EPI equation. This calculation has not been validated in all clinical situations. eGFR's persistently <90 mL/min signify possible Chronic Kidney Disease.    Anion gap 9 5 - 15  D-dimer, quantitative     Status: Abnormal   Collection Time: 04/16/14  8:40 PM  Result Value Ref Range   D-Dimer, Quant 1.43 (H) 0.00 - 0.48 ug/mL-FEU    Comment:        AT THE INHOUSE ESTABLISHED CUTOFF VALUE OF 0.48 ug/mL FEU, THIS ASSAY HAS BEEN DOCUMENTED IN THE LITERATURE TO HAVE A SENSITIVITY AND NEGATIVE PREDICTIVE VALUE OF AT LEAST 98 TO 99%.  THE TEST RESULT SHOULD BE CORRELATED WITH  AN ASSESSMENT OF THE CLINICAL PROBABILITY OF DVT / VTE.   Hepatic function panel     Status: None   Collection Time: 04/16/14  8:40 PM  Result Value Ref Range   Total Protein 7.4 6.0 - 8.3 g/dL   Albumin 3.8 3.5 - 5.2 g/dL   AST 21 0 - 37 U/L   ALT 18 0 - 53 U/L   Alkaline Phosphatase 63 39 - 117 U/L   Total Bilirubin 0.6 0.3 - 1.2 mg/dL   Bilirubin, Direct 0.2 0.0 - 0.5 mg/dL   Indirect Bilirubin 0.4 0.3 - 0.9 mg/dL  Lipase, blood     Status: None   Collection Time: 04/16/14  8:40 PM  Result Value Ref Range   Lipase 28 11 - 59 U/L  I-stat troponin, ED (not at [facility])     Status: None   Collection Time: 04/16/14  8:47 PM  Result Value Ref Range   Troponin i, poc 0.01 0.00 - 0.08 ng/mL   Comment 3            Comment: Due to the release kinetics of cTnI, a negative result within the first hours of the onset of symptoms does not rule out myocardial infarction with certainty. If myocardial infarction is still suspected, repeat the test at appropriate intervals.   TSH     Status: None   Collection Time: 04/17/14  7:11 AM   Result Value Ref Range   TSH 2.097 0.350 - 4.500 uIU/mL  Comprehensive metabolic panel     Status: Abnormal   Collection Time: 04/17/14  7:11 AM  Result Value Ref Range   Sodium 137 135 - 145 mmol/L   Potassium 4.0 3.5 - 5.1 mmol/L   Chloride 106 96 - 112 mmol/L   CO2 24 19 - 32 mmol/L   Glucose, Bld 167 (H) 70 - 99 mg/dL   BUN 21 6 - 23 mg/dL   Creatinine, Ser 1.94 (H) 0.50 - 1.35 mg/dL   Calcium 8.7 8.4 - 10.5 mg/dL   Total Protein 6.7 6.0 - 8.3 g/dL   Albumin 3.1 (L) 3.5 - 5.2 g/dL   AST 19 0 - 37 U/L   ALT 17 0 - 53 U/L   Alkaline Phosphatase 56 39 - 117 U/L   Total Bilirubin 1.3 (H) 0.3 - 1.2 mg/dL   GFR calc non Af Amer 30 (L) >90 mL/min   GFR calc Af Amer 35 (L) >90 mL/min    Comment: (NOTE) The eGFR has been calculated using the CKD EPI equation. This calculation has not been validated in all clinical situations. eGFR's persistently <90 mL/min signify possible Chronic Kidney Disease.    Anion gap 7 5 - 15  CBC     Status: Abnormal   Collection Time: 04/17/14  7:11 AM  Result Value Ref Range   WBC 8.0 4.0 - 10.5 K/uL   RBC 4.10 (L) 4.22 - 5.81 MIL/uL   Hemoglobin 12.7 (L) 13.0 - 17.0 g/dL   HCT 38.0 (L) 39.0 - 52.0 %   MCV 92.7 78.0 - 100.0 fL   MCH 31.0 26.0 - 34.0 pg   MCHC 33.4 30.0 - 36.0 g/dL   RDW 13.0 11.5 - 15.5 %   Platelets 193 150 - 400 K/uL  Hemoglobin A1c     Status: Abnormal   Collection Time: 04/17/14  7:11 AM  Result Value Ref Range   Hgb A1c MFr Bld 8.7 (H) 4.8 - 5.6 %    Comment: (NOTE)  Pre-diabetes: 5.7 - 6.4         Diabetes: >6.4         Glycemic control for adults with diabetes: <7.0    Mean Plasma Glucose 203 mg/dL    Comment: (NOTE) Performed At: [facility] [place], [place] [id number] Lindon Romp MD HQ:[id number]   Glucose, capillary     Status: Abnormal   Collection Time: 04/17/14  7:36 AM  Result Value Ref Range   Glucose-Capillary 172 (H) 70 - 99 mg/dL   Comment 1 Notify RN   MRSA PCR  Screening     Status: None   Collection Time: 04/17/14  9:38 AM  Result Value Ref Range   MRSA by PCR NEGATIVE NEGATIVE    Comment:        The GeneXpert MRSA Assay (FDA approved for NASAL specimens only), is one component of a comprehensive MRSA colonization surveillance program. It is not intended to diagnose MRSA infection nor to guide or monitor treatment for MRSA infections.   Glucose, capillary     Status: Abnormal   Collection Time: 04/17/14 11:21 AM  Result Value Ref Range   Glucose-Capillary 112 (H) 70 - 99 mg/dL   Comment 1 Notify RN   Glucose, capillary     Status: Abnormal   Collection Time: 04/17/14  1:44 PM  Result Value Ref Range   Glucose-Capillary 163 (H) 70 - 99 mg/dL   Comment 1 Notify RN    Comment 2 Documented in Char   Glucose, capillary     Status: Abnormal   Collection Time: 04/17/14  4:54 PM  Result Value Ref Range   Glucose-Capillary 208 (H) 70 - 99 mg/dL   Comment 1 Notify RN   Glucose, capillary     Status: Abnormal   Collection Time: 04/17/14  9:26 PM  Result Value Ref Range   Glucose-Capillary 170 (H) 70 - 99 mg/dL  Glucose, capillary     Status: Abnormal   Collection Time: 04/18/14  7:30 AM  Result Value Ref Range   Glucose-Capillary 193 (H) 70 - 99 mg/dL    Imaging / Studies: Dg Ribs Unilateral W/chest Right  04/16/2014   CLINICAL DATA:  Right-sided anterior rib pain for 5 hours, no known injury, initial encounter  EXAM: RIGHT RIBS AND CHEST - 3+ VIEW  COMPARISON:  04/07/2013  FINDINGS: Cardiac shadow is stable. The lungs are clear bilaterally.No acute rib fracture is identified. The area of clinical abnormality corresponds to the anterior costal cartilage.  IMPRESSION: No acute rib fracture noted.   Electronically Signed   By: Inez Catalina M.D.   On: 04/16/2014 21:28   Dg Cholangiogram Operative  04/17/2014   CLINICAL DATA:  Acute cholecystitis  EXAM: INTRAOPERATIVE CHOLANGIOGRAM  TECHNIQUE: Cholangiographic images from the C-arm  fluoroscopic device were submitted for interpretation post-operatively. Please see the procedural report for the amount of contrast and the fluoroscopy time utilized.  COMPARISON:  Abdominal ultrasound 04/16/2014  FINDINGS: Cine loop and spot images obtained during intraoperative cholangiogram at the time of all laparoscopic cholecystectomy demonstrate cannulation of the cystic duct remnant and opacification of the intra and extrahepatic biliary tree. No evidence of biliary ductal dilatation, stenosis, stricture or filling defect. Contrast material passes freely through the ampulla and into the duodenum. There is a moderate extravasation of injected contrast material from the cystic duct remnant.  IMPRESSION: 1. Negative intraoperative cholangiogram. 2. Extravasation of injected contrast material from the cystic duct remnant.   Electronically Signed  By: Jacqulynn Cadet M.D.   On: 04/17/2014 13:29   US Abdomen Limited  04/17/2014   CLINICAL DATA:  Right upper quadrant pain. History of diabetes, chronic renal disease, hypertension.  EXAM: US ABDOMEN LIMITED - RIGHT UPPER QUADRANT  COMPARISON:  CT abdomen and pelvis 08/04/2011  FINDINGS: Gallbladder:  Gallbladder is filled with echogenic sludge and small echogenic structures likely representing small stones. Largest stone measures 5 mm. Gallbladder wall is thickened, measuring up to 4.1 mm. Murphy's sign is positive. Changes are consistent with acute cholecystitis in the appropriate clinical setting.  Common bile duct:  Common bile duct is obscured by bowel gas. Right hepatic duct is visualized is borderline enlarged, measuring 6 mm.  Liver:  Diffusely increased hepatic parenchymal echotexture suggesting fatty infiltration.  IMPRESSION: Sludge and stones in the gallbladder with gallbladder wall thickening and positive Murphy's sign. Changes are consistent with acute cholecystitis in the appropriate clinical setting.   Electronically Signed   By: Lucienne Capers  M.D.   On: 04/17/2014 00:02    Medications / Allergies:  Scheduled Meds: . amLODipine  5 mg Oral Daily  . folic acid  1 mg Oral Daily  . heparin  5,000 Units Subcutaneous 3 times per day  . insulin aspart  0-15 Units Subcutaneous TID WC  . pneumococcal 13-valent conjugate vaccine  0.5 mL Intramuscular Once  . thiamine  100 mg Oral Daily   Continuous Infusions: . sodium chloride 75 mL/hr at 04/18/14 0128  . lactated ringers 10 mL/hr at 04/17/14 1149   PRN Meds:.acetaminophen **OR** acetaminophen, calcium carbonate, hydrALAZINE, HYDROmorphone (DILAUDID) injection, ondansetron **OR** ondansetron (ZOFRAN) IV, oxyCODONE, oxyCODONE-acetaminophen  Antibiotics: Anti-infectives    Start     Dose/Rate Route Frequency Ordered Stop   04/17/14 0800  piperacillin-tazobactam (ZOSYN) IVPB 3.375 g  Status:  Discontinued     3.375 g 12.5 mL/hr over 240 Minutes Intravenous Every 8 hours 04/17/14 0116 04/17/14 1620   04/17/14 0130  piperacillin-tazobactam (ZOSYN) IVPB 3.375 g     3.375 g 100 mL/hr over 30 Minutes Intravenous  Once 04/17/14 0116 04/17/14 0247        Assessment/Plan Acute cholecystitis  POD#1 laparoscopic cholecystectomy with IOC---Dr. Brantley Stage -tolerating diet, passing flatus, ambulating, pain controlled, VSS, afebrile.  Can be discharged home from surgical standpoint -can DC foley and IVF -follow up and instructions provided  Erby Pian, [facility] Surgery Pager [phone] For consults and floor pages call [phone]  04/18/2014 8:49 AM

## 2014-04-18 NOTE — Discharge Summary (Signed)
Physician Discharge Summary  Patient ID: Wesley Garcia MRN: [id number] DOB/AGE: Nov 20, 1929 81 y.o.  Admit date: 04/16/2014 Discharge date: 04/18/2014  Primary Care Physician:  Wesley Kinnier, DO  Discharge Diagnoses:   . Acute cholecystitis status post laparoscopic cholecystectomy  . RUQ pain . CKD (chronic kidney disease), stage III . Type II diabetes mellitus with complication . urinary retention   Consults:  Gen. surgery   Recommendations for Outpatient Follow-up:  Follow-up outpatient with general surgery scheduled on 05/12/14  Patient was started on Flomax for urinary retention, if it continues to be a issue outpatient, may need to see urology outpatient    DIET: Carb modified diet  Allergies:  No Known Allergies   Discharge Medications:   Medication List    TAKE these medications        AMBULATORY NON FORMULARY MEDICATION  - Sure Comfort Pen Needles 31Gx3/16"  - Use as Directed to inject insulin to control blood sugar Dx 250.00     amLODipine 5 MG tablet  Commonly known as:  NORVASC  Take 1 tablet (5 mg total) by mouth daily.     glucose blood test strip  One Touch Ultra test strip used to test blood sugar three times daily. Dx: 250.00     Insulin Detemir 100 UNIT/ML Pen  Commonly known as:  LEVEMIR FLEXTOUCH  Inject 15 Units into the skin daily.     insulin lispro 100 UNIT/ML KiwkPen  Commonly known as:  HUMALOG KWIKPEN  Inject into the skin 2 (two) times daily. 0 units at breakfast, 3 units at lunch, 7 units at supper     oxyCODONE-acetaminophen 5-325 MG per tablet  Commonly known as:  PERCOCET/ROXICET  Take 1 tablet by mouth every 6 (six) hours as needed for severe pain.     promethazine 12.5 MG tablet  Commonly known as:  PHENERGAN  Take 1 tablet (12.5 mg total) by mouth every 6 (six) hours as needed for nausea or vomiting.     tamsulosin 0.4 MG Caps capsule  Commonly known as:  FLOMAX  Take 1 capsule (0.4 mg total) by mouth daily.          Brief H and P: For complete details please refer to admission H and P, but in briefIva Garcia is a 81 y.o. male with diabetes, CKD stage III, hypertension presentedwith RUQ pain. Patient stated that the pain started about 4pm. He states that he was not doing anything in particular. He had just eaten a chicken and subsequently the pain started and was worse under the right rib cage and worse on taking a deep breath. He had no nausea and no vomiting or diarrhea. He has no fever noted but felt cold. Patient had a RUQ ultrasound done which shows positive murphy's sign and sludge in the gall bladder.   Hospital Course:   Right upper quadrant pain secondary to Acute cholecystitis Patient was placed on nothing by mouth status IV fluids, IV Zosyn, Gen. surgery was consulted. Patient underwent Laparoscopic cholecystectomy which was uneventful. Patient is now tolerating solid diet without any difficulty. Follow-up scheduled with general surgery on 05/12/14.    Hypertension Stable continue Norvasc   CKD (chronic kidney disease), stage III -Currently stable, creatinine at baseline   Type II diabetes mellitus with complication Continue insulin regimen per outpatient instructions. Hemoglobin A1c is 8.7, needs tight outpatient glycemic control, advised patient to discuss with PCP and strict carb modified diet.  Urinary retention Postoperatively patient did have some urinary retention and Foley  catheter was inserted. Patient will have voiding trial prior to discharge and was started on Flomax. If this is a ongoing issue outpatient, may benefit from urology referral.  Day of Discharge BP 161/62 mmHg  Pulse 89  Temp(Src) 98.9 F (37.2 C) (Oral)  Resp 20  Ht 5\' 6"  (1.676 m)  Wt 71.623 kg (157 lb 14.4 oz)  BMI 25.50 kg/m2  SpO2 96%  Physical Exam: General: Alert and awake oriented x3 not in any acute distress. CVS: S1-S2 clear no murmur rubs or gallops Chest: clear to auscultation  bilaterally, no wheezing rales or rhonchi Abdomen: soft, mildly tender at incisions only, nondistended Extremities: no cyanosis, clubbing or edema noted bilaterally Neuro: Cranial nerves II-XII intact, no focal neurological deficits   The results of significant diagnostics from this hospitalization (including imaging, microbiology, ancillary and laboratory) are listed below for reference.    LAB RESULTS: Basic Metabolic Panel:  Recent Labs Lab 04/16/14 2040 04/17/14 0711  NA 137 137  K 4.5 4.0  CL 105 106  CO2 23 24  GLUCOSE 199* 167*  BUN 24* 21  CREATININE 2.11* 1.94*  CALCIUM 9.4 8.7   Liver Function Tests:  Recent Labs Lab 04/16/14 2040 04/17/14 0711  AST 21 19  ALT 18 17  ALKPHOS 63 56  BILITOT 0.6 1.3*  PROT 7.4 6.7  ALBUMIN 3.8 3.1*    Recent Labs Lab 04/16/14 2040  LIPASE 28   No results for input(s): AMMONIA in the last 168 hours. CBC:  Recent Labs Lab 04/16/14 2040 04/17/14 0711  WBC 10.3 8.0  HGB 13.8 12.7*  HCT 41.1 38.0*  MCV 92.4 92.7  PLT 218 193   Cardiac Enzymes: No results for input(s): CKTOTAL, CKMB, CKMBINDEX, TROPONINI in the last 168 hours. BNP: Invalid input(s): POCBNP CBG:  Recent Labs Lab 04/17/14 2126 04/18/14 0730  GLUCAP 170* 193*    Significant Diagnostic Studies:  Dg Ribs Unilateral W/chest Right  04/16/2014   CLINICAL DATA:  Right-sided anterior rib pain for 5 hours, no known injury, initial encounter  EXAM: RIGHT RIBS AND CHEST - 3+ VIEW  COMPARISON:  04/07/2013  FINDINGS: Cardiac shadow is stable. The lungs are clear bilaterally.No acute rib fracture is identified. The area of clinical abnormality corresponds to the anterior costal cartilage.  IMPRESSION: No acute rib fracture noted.   Electronically Signed   By: Inez Catalina M.D.   On: 04/16/2014 21:28   Dg Cholangiogram Operative  04/17/2014   CLINICAL DATA:  Acute cholecystitis  EXAM: INTRAOPERATIVE CHOLANGIOGRAM  TECHNIQUE: Cholangiographic images from the  C-arm fluoroscopic device were submitted for interpretation post-operatively. Please see the procedural report for the amount of contrast and the fluoroscopy time utilized.  COMPARISON:  Abdominal ultrasound 04/16/2014  FINDINGS: Cine loop and spot images obtained during intraoperative cholangiogram at the time of all laparoscopic cholecystectomy demonstrate cannulation of the cystic duct remnant and opacification of the intra and extrahepatic biliary tree. No evidence of biliary ductal dilatation, stenosis, stricture or filling defect. Contrast material passes freely through the ampulla and into the duodenum. There is a moderate extravasation of injected contrast material from the cystic duct remnant.  IMPRESSION: 1. Negative intraoperative cholangiogram. 2. Extravasation of injected contrast material from the cystic duct remnant.   Electronically Signed   By: Jacqulynn Cadet M.D.   On: 04/17/2014 13:29   US Abdomen Limited  04/17/2014   CLINICAL DATA:  Right upper quadrant pain. History of diabetes, chronic renal disease, hypertension.  EXAM: US ABDOMEN LIMITED - RIGHT  UPPER QUADRANT  COMPARISON:  CT abdomen and pelvis 08/04/2011  FINDINGS: Gallbladder:  Gallbladder is filled with echogenic sludge and small echogenic structures likely representing small stones. Largest stone measures 5 mm. Gallbladder wall is thickened, measuring up to 4.1 mm. Murphy's sign is positive. Changes are consistent with acute cholecystitis in the appropriate clinical setting.  Common bile duct:  Common bile duct is obscured by bowel gas. Right hepatic duct is visualized is borderline enlarged, measuring 6 mm.  Liver:  Diffusely increased hepatic parenchymal echotexture suggesting fatty infiltration.  IMPRESSION: Sludge and stones in the gallbladder with gallbladder wall thickening and positive Murphy's sign. Changes are consistent with acute cholecystitis in the appropriate clinical setting.   Electronically Signed   By: Lucienne Capers M.D.   On: 04/17/2014 00:02       Disposition and Follow-up:     Discharge Instructions    Diet Carb Modified    Complete by:  As directed      Discharge instructions    Complete by:  As directed   It is VERY IMPORTANT that you follow up with a PCP on a regular basis.  Check your blood glucoses before each meal and at bedtime and maintain a log of your readings.  Bring this log with you when you follow up with your PCP so that he or she can adjust your insulin at your follow up visit.     Increase activity slowly    Complete by:  As directed             DISPOSITION: Home   DISCHARGE FOLLOW-UP Follow-up Information    Follow up with [facility] On 05/12/2014.   Why:  Doc of the [facility], 1:30pm, arrive no later than 1:00pm for paperwork    Contact information:   [place] [address] [phone]       Follow up with REED, TIFFANY, DO. Schedule an appointment as soon as possible for a visit in 10 days.   Specialty:  Geriatric Medicine   Why:  for hospital follow-up   Contact information:   [address] [place] [address] [phone]        Time spent on Discharge: 35 minutes  Signed:   Angas Isabell M.D. [facility] 04/18/2014, 9:55 AM Pager: [phone]

## 2014-04-18 NOTE — Progress Notes (Signed)
0500 Patient voiding small amounts. Patient scan for 550. Foley inserted # [id number] clear yellow urine obtained. [place] notified and stated to keep foley in. Will continue to monitor.

## 2014-04-18 NOTE — Progress Notes (Signed)
Discharge instructions gone over with patient and spouse present. Home medications gone over. Prescriptions given. Follow up appointment is made. Diet, activity, and signs and symptoms of worsening condition gone over.  Reasons to call the doctor gone over. My chart discussed. Patient verbalized understanding of instructions.

## 2014-04-20 ENCOUNTER — Encounter (HOSPITAL_COMMUNITY): Payer: Self-pay | Admitting: Surgery

## 2014-05-07 ENCOUNTER — Other Ambulatory Visit: Payer: PPO

## 2014-05-07 DIAGNOSIS — E1122 Type 2 diabetes mellitus with diabetic chronic kidney disease: Secondary | ICD-10-CM

## 2014-05-08 LAB — COMPREHENSIVE METABOLIC PANEL
ALT: 11 IU/L (ref 0–44)
AST: 19 IU/L (ref 0–40)
Albumin/Globulin Ratio: 1.4 (ref 1.1–2.5)
Albumin: 4 g/dL (ref 3.5–4.7)
Alkaline Phosphatase: 73 IU/L (ref 39–117)
BUN/Creatinine Ratio: 11 (ref 10–22)
BUN: 21 mg/dL (ref 8–27)
Bilirubin Total: 0.7 mg/dL (ref 0.0–1.2)
CO2: 22 mmol/L (ref 18–29)
Calcium: 9.4 mg/dL (ref 8.6–10.2)
Chloride: 102 mmol/L (ref 97–108)
Creatinine, Ser: 2 mg/dL — ABNORMAL HIGH (ref 0.76–1.27)
GFR calc Af Amer: 35 mL/min/{1.73_m2} — ABNORMAL LOW (ref >59)
GFR calc non Af Amer: 30 mL/min/{1.73_m2} — ABNORMAL LOW (ref >59)
Globulin, Total: 2.8 g/dL (ref 1.5–4.5)
Glucose: 100 mg/dL — ABNORMAL HIGH (ref 65–99)
Potassium: 4.5 mmol/L (ref 3.5–5.2)
Sodium: 139 mmol/L (ref 134–144)
Total Protein: 6.8 g/dL (ref 6.0–8.5)

## 2014-05-08 LAB — HEMOGLOBIN A1C
Est. average glucose Bld gHb Est-mCnc: 186 mg/dL
Hgb A1c MFr Bld: 8.1 % — ABNORMAL HIGH (ref 4.8–5.6)

## 2014-05-08 LAB — LIPID PANEL
Chol/HDL Ratio: 1.5 ratio units (ref 0.0–5.0)
Cholesterol, Total: 95 mg/dL — ABNORMAL LOW (ref 100–199)
HDL: 65 mg/dL (ref >39)
LDL Calculated: 23 mg/dL (ref 0–99)
Triglycerides: 35 mg/dL (ref 0–149)
VLDL Cholesterol Cal: 7 mg/dL (ref 5–40)

## 2014-05-11 ENCOUNTER — Encounter: Payer: Self-pay | Admitting: Pharmacotherapy

## 2014-05-11 ENCOUNTER — Ambulatory Visit (INDEPENDENT_AMBULATORY_CARE_PROVIDER_SITE_OTHER): Payer: PPO | Admitting: Pharmacotherapy

## 2014-05-11 VITALS — BP 150/90 | HR 66 | Temp 97.6°F | Resp 20 | Ht 66.0 in | Wt 154.8 lb

## 2014-05-11 DIAGNOSIS — E1122 Type 2 diabetes mellitus with diabetic chronic kidney disease: Secondary | ICD-10-CM | POA: Diagnosis not present

## 2014-05-11 DIAGNOSIS — I10 Essential (primary) hypertension: Secondary | ICD-10-CM | POA: Diagnosis not present

## 2014-05-11 DIAGNOSIS — N189 Chronic kidney disease, unspecified: Secondary | ICD-10-CM | POA: Diagnosis not present

## 2014-05-11 MED ORDER — INSULIN ASPART 100 UNIT/ML FLEXPEN: PEN_INJECTOR | SUBCUTANEOUS | Status: DC

## 2014-05-11 NOTE — Progress Notes (Signed)
  Subjective:    Wesley Garcia is a 81 y.o.white male who presents for follow-up of Type 2 diabetes mellitus.   A1C 8.1% is an improvement. Some itching when he goes to bed Does have dry skin Fasting BG average is 149m/dl Not checking any other times unless he suspects a problems.  Trying to eat healthy choices. No routine exercise.  Was told to limit activity after gallbladder surgery.  Has F/U with surgeon tomorrow. Denies problems with feet. Denies peripheral edema Vision is worse.  Wearing his glasses most of the time. Nocturia 1-2 times per night.  He is currently on Levemir 15 units daily Taking Humalog 3 units lunch and 5 units supper.  Review of Systems A comprehensive review of systems was negative except for: Eyes: positive for visual disturbance Genitourinary: positive for nocturia    Objective:    BP 150/90 mmHg  Pulse 66  Temp(Src) 97.6 F (36.4 C) (Oral)  Resp 20  Ht 5' 6"$  (1.676 m)  Wt 154 lb 12.8 oz (70.217 kg)  BMI 25.00 kg/m2  SpO2 99%  General:  alert, cooperative and no distress  Oropharynx: normal findings: lips normal without lesions and gums healthy   Eyes:  negative findings: lids and lashes normal and conjunctivae and sclerae normal   Ears:  external ears normal        Lung: clear to auscultation bilaterally  Heart:  regular rate and rhythm     Extremities: no edema noted  Skin: dry     Neuro: mental status, speech normal, alert and oriented x3 and gait and station normal   Lab Review GLUCOSE (mg/dL)  Date Value  05/07/2014 100*  01/12/2014 280*  12/04/2013 151*   GLUCOSE, BLD (mg/dL)  Date Value  04/17/2014 167*  04/16/2014 199*   CO2 (mmol/L)  Date Value  05/07/2014 22  04/17/2014 24  04/16/2014 23   BUN (mg/dL)  Date Value  05/07/2014 21  04/17/2014 21  04/16/2014 24*  01/12/2014 25  12/04/2013 25   CREATININE, SER (mg/dL)  Date Value  05/07/2014 2.00*  04/17/2014 1.94*  04/16/2014 2.11*     A1C :  8.1% Total  cholesterol:  95 Triglycerides:  35 HDL:  65 LDL:  23  Assessment:    Diabetes Mellitus type II, under fair control.    Plan:    1.  Rx changes: increase Novolog 5 units with lunch and 7 units with supper 2.  Continue Levemir 15 units daily. 3.  Counseled on basal / bolus therapy.  Needs to SMBG in the afternoon / evening too. 4.  Reviewed nutrition goals. 5.  Resume exercise when released by surgeon. 6.  BP above target <140/90.  He has normal BP outside of office..  Continue amlodipine and monitor.

## 2014-05-11 NOTE — Patient Instructions (Signed)
Increase mealtime insulin to 5 units with lunch and 7 units with supper.

## 2014-06-22 ENCOUNTER — Other Ambulatory Visit: Payer: Self-pay | Admitting: *Deleted

## 2014-06-22 MED ORDER — GLUCOSE BLOOD VI STRP: ORAL_STRIP | Status: DC

## 2014-06-22 NOTE — Telephone Encounter (Signed)
Pharmacy Requested

## 2014-06-22 NOTE — Telephone Encounter (Signed)
[facility] 

## 2014-09-10 ENCOUNTER — Other Ambulatory Visit: Payer: PPO

## 2014-09-10 DIAGNOSIS — E1122 Type 2 diabetes mellitus with diabetic chronic kidney disease: Secondary | ICD-10-CM

## 2014-09-11 LAB — COMPREHENSIVE METABOLIC PANEL
ALT: 15 IU/L (ref 0–44)
AST: 21 IU/L (ref 0–40)
Albumin/Globulin Ratio: 1.7 (ref 1.1–2.5)
Albumin: 4.3 g/dL (ref 3.5–4.7)
Alkaline Phosphatase: 63 IU/L (ref 39–117)
BUN/Creatinine Ratio: 12 (ref 10–22)
BUN: 25 mg/dL (ref 8–27)
Bilirubin Total: 0.6 mg/dL (ref 0.0–1.2)
CO2: 24 mmol/L (ref 18–29)
Calcium: 10 mg/dL (ref 8.6–10.2)
Chloride: 102 mmol/L (ref 97–108)
Creatinine, Ser: 2.1 mg/dL — ABNORMAL HIGH (ref 0.76–1.27)
GFR calc Af Amer: 33 mL/min/{1.73_m2} — ABNORMAL LOW (ref >59)
GFR calc non Af Amer: 28 mL/min/{1.73_m2} — ABNORMAL LOW (ref >59)
Globulin, Total: 2.5 g/dL (ref 1.5–4.5)
Glucose: 145 mg/dL — ABNORMAL HIGH (ref 65–99)
Potassium: 4.7 mmol/L (ref 3.5–5.2)
Sodium: 142 mmol/L (ref 134–144)
Total Protein: 6.8 g/dL (ref 6.0–8.5)

## 2014-09-11 LAB — MICROALBUMIN / CREATININE URINE RATIO
Creatinine, Urine: 176 mg/dL
MICROALB/CREAT RATIO: 34.3 mg/g creat — ABNORMAL HIGH (ref 0.0–30.0)
Microalbumin, Urine: 60.3 ug/mL

## 2014-09-11 LAB — HEMOGLOBIN A1C
Est. average glucose Bld gHb Est-mCnc: 171 mg/dL
Hgb A1c MFr Bld: 7.6 % — ABNORMAL HIGH (ref 4.8–5.6)

## 2014-09-14 ENCOUNTER — Ambulatory Visit (INDEPENDENT_AMBULATORY_CARE_PROVIDER_SITE_OTHER): Payer: PPO | Admitting: Pharmacotherapy

## 2014-09-14 ENCOUNTER — Telehealth: Payer: Self-pay | Admitting: *Deleted

## 2014-09-14 VITALS — HR 63 | Temp 98.1°F | Resp 18 | Ht 66.0 in | Wt 156.0 lb

## 2014-09-14 DIAGNOSIS — N184 Chronic kidney disease, stage 4 (severe): Secondary | ICD-10-CM

## 2014-09-14 DIAGNOSIS — N189 Chronic kidney disease, unspecified: Secondary | ICD-10-CM | POA: Diagnosis not present

## 2014-09-14 DIAGNOSIS — N058 Unspecified nephritic syndrome with other morphologic changes: Secondary | ICD-10-CM

## 2014-09-14 DIAGNOSIS — E1129 Type 2 diabetes mellitus with other diabetic kidney complication: Secondary | ICD-10-CM

## 2014-09-14 DIAGNOSIS — I10 Essential (primary) hypertension: Secondary | ICD-10-CM | POA: Diagnosis not present

## 2014-09-14 DIAGNOSIS — E1122 Type 2 diabetes mellitus with diabetic chronic kidney disease: Secondary | ICD-10-CM

## 2014-09-14 MED ORDER — INSULIN DETEMIR 100 UNIT/ML FLEXPEN: 18.0000 [IU] | PEN_INJECTOR | Freq: Every day | SUBCUTANEOUS | Status: DC

## 2014-09-14 MED ORDER — INSULIN ASPART 100 UNIT/ML FLEXPEN: PEN_INJECTOR | SUBCUTANEOUS | Status: DC

## 2014-09-14 NOTE — Patient Instructions (Signed)
Good improvement!  Increase Levemir 18 units once daily Continue Novolog 5 units lunch and 7 units supper

## 2014-09-14 NOTE — Progress Notes (Signed)
  Subjective:    Wesley Garcia is a 81 y.o.white male who presents for follow-up of Type 2 diabetes mellitus.   A1C has improved to 7.6% (was 8.1%) [place] getting off his lawnmower.  No injury.  He feels his BG is too high. Only had 2 episodes of hypoglycemia.  Easily corrected with Coke. Forgot to bring meter. Self reports fasting BG:  160's Random daytime/evening readings:  150's  Not checking often later in the day. Continues Levemir and Novolog.  Denies missed doses.  Usually eats healthy food choices Dancing twice a week for exercise Denies problems with feet. Some peripheral edema. Wears corrective lenses. Denies problems with vision.  Nocturia one to two times per night.   Review of Systems A comprehensive review of systems was negative except for: Eyes: positive for contacts/glasses Cardiovascular: positive for lower extremity edema Genitourinary: positive for nocturia    Objective:    Pulse 63  Temp(Src) 98.1 F (36.7 C) (Oral)  Resp 18  Ht 5\' 6"  (1.676 m)  Wt 156 lb (70.761 kg)  BMI 25.19 kg/m2  SpO2 98%  General:  alert, cooperative, appears stated age and no distress  Oropharynx: normal findings: lips normal without lesions and gums healthy   Eyes:  negative findings: lids and lashes normal and conjunctivae and sclerae normal   Ears:  external ears normal        Lung: clear to auscultation bilaterally  Heart:  regular rate and rhythm     Extremities: edema trace bilaterally  Skin: warm and dry, no hyperpigmentation, vitiligo, or suspicious lesions     Neuro: mental status, speech normal, alert and oriented x3 and gait and station normal   Lab Review GLUCOSE (mg/dL)  Date Value  09/10/2014 145*  05/07/2014 100*  01/12/2014 280*   GLUCOSE, BLD (mg/dL)  Date Value  04/17/2014 167*  04/16/2014 199*   CO2 (mmol/L)  Date Value  09/10/2014 24  05/07/2014 22  04/17/2014 24   BUN (mg/dL)  Date Value  09/10/2014 25  05/07/2014 21  04/17/2014  21  04/16/2014 24*  01/12/2014 25   CREATININE, SER (mg/dL)  Date Value  09/10/2014 2.10*  05/07/2014 2.00*  04/17/2014 1.94*       Assessment:    Diabetes Mellitus type II, under good control. A1C has improved.  Now <8%. BP at goal <140/90.  Currently 130/82 CKD - stable   Plan:    1.  Rx changes: Increase Levemir 18 units daily 2.  Continue Novolog 5 units lunch and 7 units supper. 3.  BP at goal <140/90. 4.  Reviewed nutrition goals. 5.  Praised exercise efforts. 6.  CKD stable.

## 2014-09-14 NOTE — Telephone Encounter (Signed)
Please contact Wesley Garcia and ask her about this.

## 2014-09-14 NOTE — Telephone Encounter (Signed)
Patient called and stated that the medication Cathy prescribed this morning is too high $135.00.  The Novolog is too much and wants it switched to something else even if it is pills. Please Advise.

## 2014-09-21 MED ORDER — AMBULATORY NON FORMULARY MEDICATION: Status: DC

## 2014-09-21 MED ORDER — "INSULIN SYRINGE 31G X 5/16"" 1 ML MISC": Status: DC

## 2014-09-21 NOTE — Telephone Encounter (Signed)
Spoke with Wesley Garcia-- [facility] Reli-On R same dose with vial/syringe 5units with lunch and 7 units with supper Patient notified and Rx faxed to [facility]. Patient is not using Novolog.

## 2014-10-12 ENCOUNTER — Encounter: Payer: Self-pay | Admitting: Pharmacotherapy

## 2014-12-03 ENCOUNTER — Other Ambulatory Visit: Payer: PPO

## 2014-12-03 DIAGNOSIS — E1122 Type 2 diabetes mellitus with diabetic chronic kidney disease: Secondary | ICD-10-CM

## 2014-12-04 LAB — COMPREHENSIVE METABOLIC PANEL
ALT: 15 IU/L (ref 0–44)
AST: 18 IU/L (ref 0–40)
Albumin/Globulin Ratio: 1.7 (ref 1.1–2.5)
Albumin: 4.4 g/dL (ref 3.5–4.7)
Alkaline Phosphatase: 68 IU/L (ref 39–117)
BUN/Creatinine Ratio: 13 (ref 10–22)
BUN: 26 mg/dL (ref 8–27)
Bilirubin Total: 0.7 mg/dL (ref 0.0–1.2)
CO2: 24 mmol/L (ref 18–29)
Calcium: 10 mg/dL (ref 8.6–10.2)
Chloride: 101 mmol/L (ref 97–108)
Creatinine, Ser: 2 mg/dL — ABNORMAL HIGH (ref 0.76–1.27)
GFR calc Af Amer: 35 mL/min/{1.73_m2} — ABNORMAL LOW (ref >59)
GFR calc non Af Amer: 30 mL/min/{1.73_m2} — ABNORMAL LOW (ref >59)
Globulin, Total: 2.6 g/dL (ref 1.5–4.5)
Glucose: 149 mg/dL — ABNORMAL HIGH (ref 65–99)
Potassium: 4.6 mmol/L (ref 3.5–5.2)
Sodium: 143 mmol/L (ref 134–144)
Total Protein: 7 g/dL (ref 6.0–8.5)

## 2014-12-04 LAB — HEMOGLOBIN A1C
Est. average glucose Bld gHb Est-mCnc: 160 mg/dL
Hgb A1c MFr Bld: 7.2 % — ABNORMAL HIGH (ref 4.8–5.6)

## 2014-12-07 ENCOUNTER — Ambulatory Visit (INDEPENDENT_AMBULATORY_CARE_PROVIDER_SITE_OTHER): Payer: PPO | Admitting: Pharmacotherapy

## 2014-12-07 ENCOUNTER — Encounter: Payer: Self-pay | Admitting: Pharmacotherapy

## 2014-12-07 VITALS — BP 130/76 | HR 64 | Temp 97.4°F | Resp 20 | Ht 66.0 in | Wt 154.6 lb

## 2014-12-07 DIAGNOSIS — N184 Chronic kidney disease, stage 4 (severe): Secondary | ICD-10-CM | POA: Diagnosis not present

## 2014-12-07 DIAGNOSIS — I10 Essential (primary) hypertension: Secondary | ICD-10-CM | POA: Diagnosis not present

## 2014-12-07 DIAGNOSIS — E1122 Type 2 diabetes mellitus with diabetic chronic kidney disease: Secondary | ICD-10-CM

## 2014-12-07 DIAGNOSIS — Z794 Long term (current) use of insulin: Secondary | ICD-10-CM | POA: Diagnosis not present

## 2014-12-07 NOTE — Progress Notes (Signed)
  Subjective:    Wesley Garcia is a 81 y.o.white male who presents for follow-up of Type 2 diabetes mellitus.   A1C:  7.2%.  Was 7.6% Average BG:  139m/dl Has not been using his Humalog - too expensive  Only on Levemir 20 units daily Not as much exercise Trying to make healthy food choices. Denies problems with feet. Denies problems with vision. Not as much nocturia.  Usually once per night. Denies peripheral edema.  Review of Systems A comprehensive review of systems was negative except for: Genitourinary: positive for nocturia    Objective:    BP 130/76 mmHg  Pulse 64  Temp(Src) 97.4 F (36.3 C) (Oral)  Resp 20  Ht 5' 6"$  (1.676 m)  Wt 154 lb 9.6 oz (70.126 kg)  BMI 24.96 kg/m2  SpO2 93%  General:  alert, cooperative and no distress  Oropharynx: normal findings: lips normal without lesions and gums healthy   Eyes:  negative findings: lids and lashes normal and conjunctivae and sclerae normal   Ears:  external ears normal        Lung: clear to auscultation bilaterally  Heart:  regular rate and rhythm     Extremities: extremities normal, atraumatic, no cyanosis or edema  Skin: warm and dry, no hyperpigmentation, vitiligo, or suspicious lesions     Neuro: mental status, speech normal, alert and oriented x3 and gait and station normal   Lab Review GLUCOSE (mg/dL)  Date Value  12/03/2014 149*  09/10/2014 145*  05/07/2014 100*   GLUCOSE, BLD (mg/dL)  Date Value  04/17/2014 167*  04/16/2014 199*   CO2 (mmol/L)  Date Value  12/03/2014 24  09/10/2014 24  05/07/2014 22   BUN (mg/dL)  Date Value  12/03/2014 26  09/10/2014 25  05/07/2014 21  04/17/2014 21  04/16/2014 24*   CREATININE, SER (mg/dL)  Date Value  12/03/2014 2.00*  09/10/2014 2.10*  05/07/2014 2.00*       Assessment:    Diabetes Mellitus type II, under good control.    Plan:    1.  Rx changes: none 2.  Continue Levemir 20 units daily 3.  A1C actually better.  Will hold pre-meal  insulin for now as he cannot afford to take it. 4.  CKD stable - SCr and GFR stable. 5.  BP at goal <140/90 6.  Exercise goal is 30-45 minutes 5 x week. 7.  Counseled on nutrition goals. 8.  RTC in 3 months.

## 2014-12-14 ENCOUNTER — Ambulatory Visit: Payer: PPO | Admitting: Pharmacotherapy

## 2015-03-18 ENCOUNTER — Other Ambulatory Visit: Payer: PPO

## 2015-03-19 ENCOUNTER — Other Ambulatory Visit: Payer: PPO

## 2015-03-22 ENCOUNTER — Ambulatory Visit: Payer: PPO | Admitting: Pharmacotherapy

## 2015-05-19 DIAGNOSIS — I10 Essential (primary) hypertension: Secondary | ICD-10-CM | POA: Diagnosis not present

## 2015-05-19 DIAGNOSIS — L570 Actinic keratosis: Secondary | ICD-10-CM | POA: Diagnosis not present

## 2015-05-19 DIAGNOSIS — I951 Orthostatic hypotension: Secondary | ICD-10-CM | POA: Diagnosis not present

## 2015-05-19 DIAGNOSIS — E1141 Type 2 diabetes mellitus with diabetic mononeuropathy: Secondary | ICD-10-CM | POA: Diagnosis not present

## 2015-05-19 DIAGNOSIS — Z1212 Encounter for screening for malignant neoplasm of rectum: Secondary | ICD-10-CM | POA: Diagnosis not present

## 2015-05-19 DIAGNOSIS — N184 Chronic kidney disease, stage 4 (severe): Secondary | ICD-10-CM | POA: Diagnosis not present

## 2015-05-25 DIAGNOSIS — E785 Hyperlipidemia, unspecified: Secondary | ICD-10-CM | POA: Diagnosis not present

## 2015-05-25 DIAGNOSIS — N183 Chronic kidney disease, stage 3 (moderate): Secondary | ICD-10-CM | POA: Diagnosis not present

## 2015-05-27 DIAGNOSIS — Z794 Long term (current) use of insulin: Secondary | ICD-10-CM | POA: Diagnosis not present

## 2015-05-27 DIAGNOSIS — Z961 Presence of intraocular lens: Secondary | ICD-10-CM | POA: Diagnosis not present

## 2015-05-27 DIAGNOSIS — E113393 Type 2 diabetes mellitus with moderate nonproliferative diabetic retinopathy without macular edema, bilateral: Secondary | ICD-10-CM | POA: Diagnosis not present

## 2015-05-27 DIAGNOSIS — H32 Chorioretinal disorders in diseases classified elsewhere: Secondary | ICD-10-CM | POA: Diagnosis not present

## 2015-06-07 DIAGNOSIS — N186 End stage renal disease: Secondary | ICD-10-CM | POA: Diagnosis not present

## 2015-06-07 DIAGNOSIS — N184 Chronic kidney disease, stage 4 (severe): Secondary | ICD-10-CM | POA: Diagnosis not present

## 2015-06-07 DIAGNOSIS — I1310 Hypertensive heart and chronic kidney disease without heart failure, with stage 1 through stage 4 chronic kidney disease, or unspecified chronic kidney disease: Secondary | ICD-10-CM | POA: Diagnosis not present

## 2015-06-07 DIAGNOSIS — E1140 Type 2 diabetes mellitus with diabetic neuropathy, unspecified: Secondary | ICD-10-CM | POA: Diagnosis not present

## 2015-07-29 DIAGNOSIS — I10 Essential (primary) hypertension: Secondary | ICD-10-CM | POA: Diagnosis not present

## 2015-07-29 DIAGNOSIS — Z0000 Encounter for general adult medical examination without abnormal findings: Secondary | ICD-10-CM | POA: Diagnosis not present

## 2015-07-29 DIAGNOSIS — Z794 Long term (current) use of insulin: Secondary | ICD-10-CM | POA: Diagnosis not present

## 2015-07-29 DIAGNOSIS — E118 Type 2 diabetes mellitus with unspecified complications: Secondary | ICD-10-CM | POA: Diagnosis not present

## 2015-08-06 DIAGNOSIS — L814 Other melanin hyperpigmentation: Secondary | ICD-10-CM | POA: Diagnosis not present

## 2015-08-06 DIAGNOSIS — L821 Other seborrheic keratosis: Secondary | ICD-10-CM | POA: Diagnosis not present

## 2015-08-06 DIAGNOSIS — L570 Actinic keratosis: Secondary | ICD-10-CM | POA: Diagnosis not present

## 2015-08-17 DIAGNOSIS — E1121 Type 2 diabetes mellitus with diabetic nephropathy: Secondary | ICD-10-CM | POA: Diagnosis not present

## 2015-08-17 DIAGNOSIS — R69 Illness, unspecified: Secondary | ICD-10-CM | POA: Diagnosis not present

## 2015-08-17 DIAGNOSIS — R2681 Unsteadiness on feet: Secondary | ICD-10-CM | POA: Diagnosis not present

## 2015-08-17 DIAGNOSIS — E1122 Type 2 diabetes mellitus with diabetic chronic kidney disease: Secondary | ICD-10-CM | POA: Diagnosis not present

## 2015-08-17 DIAGNOSIS — Z79899 Other long term (current) drug therapy: Secondary | ICD-10-CM | POA: Diagnosis not present

## 2015-08-17 DIAGNOSIS — E785 Hyperlipidemia, unspecified: Secondary | ICD-10-CM | POA: Diagnosis not present

## 2015-08-17 DIAGNOSIS — I10 Essential (primary) hypertension: Secondary | ICD-10-CM | POA: Diagnosis not present

## 2015-08-20 DIAGNOSIS — R260 Ataxic gait: Secondary | ICD-10-CM | POA: Diagnosis not present

## 2015-09-17 DIAGNOSIS — E1140 Type 2 diabetes mellitus with diabetic neuropathy, unspecified: Secondary | ICD-10-CM | POA: Diagnosis not present

## 2015-09-17 DIAGNOSIS — N184 Chronic kidney disease, stage 4 (severe): Secondary | ICD-10-CM | POA: Diagnosis not present

## 2015-10-18 DIAGNOSIS — R69 Illness, unspecified: Secondary | ICD-10-CM | POA: Diagnosis not present

## 2015-10-30 DIAGNOSIS — R69 Illness, unspecified: Secondary | ICD-10-CM | POA: Diagnosis not present

## 2015-11-18 DIAGNOSIS — E1122 Type 2 diabetes mellitus with diabetic chronic kidney disease: Secondary | ICD-10-CM | POA: Diagnosis not present

## 2015-11-18 DIAGNOSIS — I10 Essential (primary) hypertension: Secondary | ICD-10-CM | POA: Diagnosis not present

## 2015-11-18 DIAGNOSIS — Z6823 Body mass index (BMI) 23.0-23.9, adult: Secondary | ICD-10-CM | POA: Diagnosis not present

## 2015-11-18 DIAGNOSIS — E1121 Type 2 diabetes mellitus with diabetic nephropathy: Secondary | ICD-10-CM | POA: Diagnosis not present

## 2015-11-18 DIAGNOSIS — Z794 Long term (current) use of insulin: Secondary | ICD-10-CM | POA: Diagnosis not present

## 2015-11-18 DIAGNOSIS — Z79899 Other long term (current) drug therapy: Secondary | ICD-10-CM | POA: Diagnosis not present

## 2015-11-18 DIAGNOSIS — N184 Chronic kidney disease, stage 4 (severe): Secondary | ICD-10-CM | POA: Diagnosis not present

## 2015-11-18 DIAGNOSIS — H9193 Unspecified hearing loss, bilateral: Secondary | ICD-10-CM | POA: Diagnosis not present

## 2015-12-18 DIAGNOSIS — R69 Illness, unspecified: Secondary | ICD-10-CM | POA: Diagnosis not present

## 2016-01-14 DIAGNOSIS — R69 Illness, unspecified: Secondary | ICD-10-CM | POA: Diagnosis not present

## 2016-01-17 DIAGNOSIS — Z794 Long term (current) use of insulin: Secondary | ICD-10-CM | POA: Diagnosis not present

## 2016-01-17 DIAGNOSIS — R0602 Shortness of breath: Secondary | ICD-10-CM | POA: Diagnosis not present

## 2016-01-17 DIAGNOSIS — R030 Elevated blood-pressure reading, without diagnosis of hypertension: Secondary | ICD-10-CM | POA: Diagnosis not present

## 2016-01-17 DIAGNOSIS — E119 Type 2 diabetes mellitus without complications: Secondary | ICD-10-CM | POA: Diagnosis not present

## 2016-01-19 DIAGNOSIS — I10 Essential (primary) hypertension: Secondary | ICD-10-CM | POA: Diagnosis not present

## 2016-01-19 DIAGNOSIS — N189 Chronic kidney disease, unspecified: Secondary | ICD-10-CM | POA: Diagnosis not present

## 2016-01-19 DIAGNOSIS — E119 Type 2 diabetes mellitus without complications: Secondary | ICD-10-CM | POA: Diagnosis not present

## 2016-01-26 DIAGNOSIS — N189 Chronic kidney disease, unspecified: Secondary | ICD-10-CM | POA: Diagnosis not present

## 2016-01-26 DIAGNOSIS — Z136 Encounter for screening for cardiovascular disorders: Secondary | ICD-10-CM | POA: Diagnosis not present

## 2016-01-26 DIAGNOSIS — E119 Type 2 diabetes mellitus without complications: Secondary | ICD-10-CM | POA: Diagnosis not present

## 2016-01-26 DIAGNOSIS — I10 Essential (primary) hypertension: Secondary | ICD-10-CM | POA: Diagnosis not present

## 2016-01-26 DIAGNOSIS — R0981 Nasal congestion: Secondary | ICD-10-CM | POA: Diagnosis not present

## 2016-02-02 ENCOUNTER — Encounter: Payer: Self-pay | Admitting: Internal Medicine

## 2016-02-21 DIAGNOSIS — I10 Essential (primary) hypertension: Secondary | ICD-10-CM | POA: Diagnosis not present

## 2016-02-21 DIAGNOSIS — R42 Dizziness and giddiness: Secondary | ICD-10-CM | POA: Diagnosis not present

## 2016-02-21 DIAGNOSIS — R0609 Other forms of dyspnea: Secondary | ICD-10-CM | POA: Diagnosis not present

## 2016-02-21 DIAGNOSIS — R0789 Other chest pain: Secondary | ICD-10-CM | POA: Diagnosis not present

## 2016-03-13 DIAGNOSIS — I517 Cardiomegaly: Secondary | ICD-10-CM | POA: Diagnosis not present

## 2016-03-13 DIAGNOSIS — I340 Nonrheumatic mitral (valve) insufficiency: Secondary | ICD-10-CM | POA: Diagnosis not present

## 2016-03-13 DIAGNOSIS — I361 Nonrheumatic tricuspid (valve) insufficiency: Secondary | ICD-10-CM | POA: Diagnosis not present

## 2016-03-13 DIAGNOSIS — R42 Dizziness and giddiness: Secondary | ICD-10-CM | POA: Diagnosis not present

## 2016-03-14 DIAGNOSIS — I361 Nonrheumatic tricuspid (valve) insufficiency: Secondary | ICD-10-CM | POA: Diagnosis not present

## 2016-03-14 DIAGNOSIS — R42 Dizziness and giddiness: Secondary | ICD-10-CM | POA: Diagnosis not present

## 2016-03-14 DIAGNOSIS — I517 Cardiomegaly: Secondary | ICD-10-CM | POA: Diagnosis not present

## 2016-03-14 DIAGNOSIS — I340 Nonrheumatic mitral (valve) insufficiency: Secondary | ICD-10-CM | POA: Diagnosis not present

## 2016-03-16 DIAGNOSIS — I10 Essential (primary) hypertension: Secondary | ICD-10-CM | POA: Diagnosis not present

## 2016-03-16 DIAGNOSIS — E119 Type 2 diabetes mellitus without complications: Secondary | ICD-10-CM | POA: Diagnosis not present

## 2016-03-16 DIAGNOSIS — R0789 Other chest pain: Secondary | ICD-10-CM | POA: Diagnosis not present

## 2016-03-16 DIAGNOSIS — R0602 Shortness of breath: Secondary | ICD-10-CM | POA: Diagnosis not present

## 2016-03-22 DIAGNOSIS — R69 Illness, unspecified: Secondary | ICD-10-CM | POA: Diagnosis not present

## 2016-04-10 DIAGNOSIS — I10 Essential (primary) hypertension: Secondary | ICD-10-CM | POA: Diagnosis not present

## 2016-04-10 DIAGNOSIS — N189 Chronic kidney disease, unspecified: Secondary | ICD-10-CM | POA: Diagnosis not present

## 2016-04-10 DIAGNOSIS — R0602 Shortness of breath: Secondary | ICD-10-CM | POA: Diagnosis not present

## 2016-04-10 DIAGNOSIS — E119 Type 2 diabetes mellitus without complications: Secondary | ICD-10-CM | POA: Diagnosis not present

## 2016-05-03 DIAGNOSIS — I10 Essential (primary) hypertension: Secondary | ICD-10-CM | POA: Diagnosis not present

## 2016-05-03 DIAGNOSIS — R9431 Abnormal electrocardiogram [ECG] [EKG]: Secondary | ICD-10-CM | POA: Diagnosis not present

## 2016-05-03 DIAGNOSIS — E119 Type 2 diabetes mellitus without complications: Secondary | ICD-10-CM | POA: Diagnosis not present

## 2016-05-15 ENCOUNTER — Encounter: Payer: Self-pay | Admitting: Internal Medicine

## 2016-05-15 ENCOUNTER — Ambulatory Visit (INDEPENDENT_AMBULATORY_CARE_PROVIDER_SITE_OTHER): Payer: Medicare HMO | Admitting: Internal Medicine

## 2016-05-15 VITALS — BP 140/70 | HR 51 | Temp 98.1°F | Wt 160.0 lb

## 2016-05-15 DIAGNOSIS — I495 Sick sinus syndrome: Secondary | ICD-10-CM | POA: Diagnosis not present

## 2016-05-15 DIAGNOSIS — Z794 Long term (current) use of insulin: Secondary | ICD-10-CM | POA: Diagnosis not present

## 2016-05-15 DIAGNOSIS — K219 Gastro-esophageal reflux disease without esophagitis: Secondary | ICD-10-CM

## 2016-05-15 DIAGNOSIS — E1122 Type 2 diabetes mellitus with diabetic chronic kidney disease: Secondary | ICD-10-CM

## 2016-05-15 DIAGNOSIS — N183 Chronic kidney disease, stage 3 unspecified: Secondary | ICD-10-CM

## 2016-05-15 DIAGNOSIS — I10 Essential (primary) hypertension: Secondary | ICD-10-CM | POA: Diagnosis not present

## 2016-05-15 LAB — CBC WITH DIFFERENTIAL/PLATELET
Basophils Absolute: 89 cells/uL (ref 0–200)
Basophils Relative: 1 %
Eosinophils Absolute: 178 cells/uL (ref 15–500)
Eosinophils Relative: 2 %
HCT: 40.3 % (ref 38.5–50.0)
Hemoglobin: 13.5 g/dL (ref 13.2–17.1)
Lymphocytes Relative: 20 %
Lymphs Abs: 1780 cells/uL (ref 850–3900)
MCH: 31.1 pg (ref 27.0–33.0)
MCHC: 33.5 g/dL (ref 32.0–36.0)
MCV: 92.9 fL (ref 80.0–100.0)
MPV: 9.9 fL (ref 7.5–12.5)
Monocytes Absolute: 1068 cells/uL — ABNORMAL HIGH (ref 200–950)
Monocytes Relative: 12 %
Neutro Abs: 5785 cells/uL (ref 1500–7800)
Neutrophils Relative %: 65 %
Platelets: 213 10*3/uL (ref 140–400)
RBC: 4.34 MIL/uL (ref 4.20–5.80)
RDW: 14.7 % (ref 11.0–15.0)
WBC: 8.9 10*3/uL (ref 3.8–10.8)

## 2016-05-15 LAB — BASIC METABOLIC PANEL
BUN: 24 mg/dL (ref 7–25)
CO2: 25 mmol/L (ref 20–31)
Calcium: 9.1 mg/dL (ref 8.6–10.3)
Chloride: 106 mmol/L (ref 98–110)
Creat: 2.04 mg/dL — ABNORMAL HIGH (ref 0.70–1.11)
Glucose, Bld: 187 mg/dL — ABNORMAL HIGH (ref 65–99)
Potassium: 4.2 mmol/L (ref 3.5–5.3)
Sodium: 141 mmol/L (ref 135–146)

## 2016-05-15 MED ORDER — INSULIN DEGLUDEC 200 UNIT/ML ~~LOC~~ SOPN: 12.0000 [IU] | PEN_INJECTOR | Freq: Every day | SUBCUTANEOUS | 5 refills | Status: DC

## 2016-05-15 NOTE — Progress Notes (Signed)
Location:  [facility] clinic Provider:  Devery Odwyer L. Mariea Clonts, D.O., C.M.D.  Code Status: FULL CODE Goals of Care:  Advanced Directives 04/17/2014  Does Patient Have a Medical Advance Directive? No  Would patient like information on creating a medical advance directive? No - patient declined information   Chief Complaint  Patient presents with  . Follow-up    discuss diabetes    HPI: Patient is a 83 y.o. male seen today for medical management of chronic diseases.    He is here to have his diabetes regimen adjusted.  He was seeing Dr. Posey Pronto in [place] for a while after moving there, but they want to return here to see Tivis Ringer for diabetes mgt.  He reduced his insulin tresiba from 20 to 12 units.  Says sugars were doing good.  CBGs in 70s-90s. Denies feeling like he's having lows.  If gets in the 60s, he drinks a coke or eats something sweet.  Hasn't happened in a long time.  Had gotten dizzy when he did have lows.    Dr. Posey Pronto added glipizide 5mg .    On amlodipine for blood pressure.  Saw cardiology there and was put on metoprolol tartrate due to tachy-brady syndrome after holter monitor.  Had initially gone due to bradycardia.    Had tachy at night.    Having bad GERD.  Just drinks water and gets acid reflux.  Antacids no longer doing the trick and taking every day.    Past Medical History:  Diagnosis Date  . Chronic kidney disease (CKD), stage III (moderate)   . Diabetes mellitus   . Diverticulosis   . Hard of hearing   . Hemorrhoids   . Hx of adenomatous colonic polyps 2006  . Hypertension   . 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 CHOLECYSTECTOMY N/A 04/17/2014   Procedure: LAPAROSCOPIC CHOLECYSTECTOMY WITH INTRAOPERATIVE CHOLANGIOGRAM;  Surgeon: Erroll Luna, MD;  Location: [facility];  Service: General;  Laterality: N/A;  . COLONOSCOPY  11/02/2007  . MAXILLECTOMY Right 2003   SCCA alveolar ridge    No Known  Allergies  Allergies as of 05/15/2016   No Known Allergies     Medication List       Accurate as of 05/15/16 10:50 AM. Always use your most recent med list.          amLODipine 10 MG tablet Commonly known as:  NORVASC Take 10 mg by mouth daily.   EASY COMFORT PEN NEEDLES 31G X 5 MM Misc Generic drug:  Insulin Pen Needle use as directed TO INJECT INSULIN TO CONTROL BLOOD SUGAR   glipiZIDE 5 MG tablet Commonly known as:  GLUCOTROL   glucose blood test strip One Touch Ultra test strip used to test blood sugar three times daily. Dx: E11.22   INSULIN SYRINGE 1CC/31GX5/16" 31G X 5/16" 1 ML Misc Use as Directed with insulin.   metoprolol tartrate 25 MG tablet Commonly known as:  LOPRESSOR   TRESIBA FLEXTOUCH 200 UNIT/ML Sopn Generic drug:  Insulin Degludec Inject 12 Units into the skin daily.       Review of Systems:  Review of Systems  Constitutional: Positive for malaise/fatigue. Negative for chills and fever.  HENT: Positive for hearing loss.        Hoarseness  Eyes: Negative for blurred vision.  Respiratory: Positive for cough. Negative for shortness of breath.   Cardiovascular: Positive for palpitations. Negative for chest pain and  leg swelling.       Tachy brady  Gastrointestinal: Positive for abdominal pain and heartburn. Negative for blood in stool, constipation, diarrhea, melena, nausea and vomiting.  Genitourinary: Negative for dysuria.  Musculoskeletal: Negative for falls.  Skin: Negative for itching and rash.  Neurological: Negative for dizziness, loss of consciousness and weakness.  Endo/Heme/Allergies: Bruises/bleeds easily.       Diabetes with highs and lows lately  Psychiatric/Behavioral: Negative for depression and memory loss.    Health Maintenance  Topic Date Due  . FOOT EXAM  01/16/1940  . OPHTHALMOLOGY EXAM  01/16/1940  . PNA vac Low Risk Adult (2 of 2 - PPSV23) 09/02/2014  . HEMOGLOBIN A1C  06/02/2015  . URINE MICROALBUMIN  09/10/2015  .  INFLUENZA VACCINE  10/05/2015  . COLONOSCOPY  01/29/2016  . TETANUS/TDAP  04/16/2021    Physical Exam: Vitals:   05/15/16 1031  BP: 140/70  Pulse: (!) 51  Temp: 98.1 F (36.7 C)  TempSrc: Oral  SpO2: 98%  Weight: 160 lb (72.6 kg)   Body mass index is 25.82 kg/m. Physical Exam  Constitutional: He is oriented to person, place, and time. He appears well-developed and well-nourished. No distress.  Cardiovascular: Normal rate, regular rhythm, normal heart sounds and intact distal pulses.   Pulmonary/Chest: Effort normal and breath sounds normal. No respiratory distress.  Abdominal: Soft. Bowel sounds are normal. He exhibits no distension.  Musculoskeletal: Normal range of motion.  Neurological: He is alert and oriented to person, place, and time.  Skin: Skin is warm and dry.    Labs reviewed: Basic Metabolic Panel: No results for input(s): NA, K, CL, CO2, GLUCOSE, BUN, CREATININE, CALCIUM, MG, PHOS, TSH in the last 8760 hours. Liver Function Tests: No results for input(s): AST, ALT, ALKPHOS, BILITOT, PROT, ALBUMIN in the last 8760 hours. No results for input(s): LIPASE, AMYLASE in the last 8760 hours. No results for input(s): AMMONIA in the last 8760 hours. CBC: No results for input(s): WBC, NEUTROABS, HGB, HCT, MCV, PLT in the last 8760 hours. Lipid Panel: No results for input(s): CHOL, HDL, LDLCALC, TRIG, CHOLHDL, LDLDIRECT in the last 8760 hours. Lab Results  Component Value Date   HGBA1C 7.2 (H) 12/03/2014    Assessment/Plan 1. Controlled type 2 diabetes mellitus with stage 3 chronic kidney disease, with long-term current use of insulin ([place]) - needs f/u labs to reestablish here and plans to follow with Tivis Ringer again b/c they were not happy seeing doctors in [place] metabolic panel - CBC with Differential/Platelet - Hemoglobin A1c -cont tresiba for now, stop glipizide which is not recommended in geriatrics due to hypoglycemia which he's been  having -keep insulin as he's been taking it until he sees Cathey and she can adjust if needed  2. Benign essential HTN -bp goal less than 130/80 if tolerates, but he is 86 so may be difficult to get to goal and not have dizziness and fatigue or bradycardia -f/u with Cathey and then see me again in 3 mos  3. Tachy-brady syndrome ([place]) -new issues while in [place] -saw Dr. Sabra Heck for his heart there -some care everywhere on file -HR 51 today, asymptomatic -cont coreg  4. Gastroesophageal reflux disease, esophagitis presence not specified -discussed conservative mgt and dietary changes -is using antacids daily -consider PPI if ongoing  Labs/tests ordered:   Orders Placed This Encounter  Procedures  . Basic metabolic panel  . CBC with Differential/Platelet  . Hemoglobin A1c    Next appt:  08/14/2016  Adelaida Reindel L. Addison Whidbee, D.O. [facility] [address], [place] [address] Cell Phone (Mon-Fri 8am-5pm):  [phone] On Call:  [phone] & follow prompts after 5pm & weekends Office Phone:  [phone] Office Fax:  [phone]

## 2016-05-15 NOTE — Patient Instructions (Signed)
Decrease your amlodipine to 5mg  (take 1/2 of a 10mg  pill).  See if that does not help your reflux.  If not, we will end up stopping it and replacing it with another blood pressure pill.

## 2016-05-16 LAB — HEMOGLOBIN A1C
Hgb A1c MFr Bld: 7.6 % — ABNORMAL HIGH (ref <5.7)
Mean Plasma Glucose: 171 mg/dL

## 2016-05-22 ENCOUNTER — Ambulatory Visit (INDEPENDENT_AMBULATORY_CARE_PROVIDER_SITE_OTHER): Payer: Medicare HMO | Admitting: Pharmacotherapy

## 2016-05-22 ENCOUNTER — Encounter: Payer: Self-pay | Admitting: Pharmacotherapy

## 2016-05-22 VITALS — BP 150/74 | HR 72 | Resp 14 | Ht 66.0 in | Wt 156.0 lb

## 2016-05-22 DIAGNOSIS — N184 Chronic kidney disease, stage 4 (severe): Secondary | ICD-10-CM | POA: Diagnosis not present

## 2016-05-22 DIAGNOSIS — I10 Essential (primary) hypertension: Secondary | ICD-10-CM

## 2016-05-22 DIAGNOSIS — N183 Chronic kidney disease, stage 3 unspecified: Secondary | ICD-10-CM

## 2016-05-22 DIAGNOSIS — E1122 Type 2 diabetes mellitus with diabetic chronic kidney disease: Secondary | ICD-10-CM | POA: Diagnosis not present

## 2016-05-22 DIAGNOSIS — Z794 Long term (current) use of insulin: Secondary | ICD-10-CM | POA: Diagnosis not present

## 2016-05-22 MED ORDER — CARVEDILOL 6.25 MG PO TABS: 6.2500 mg | ORAL_TABLET | Freq: Two times a day (BID) | ORAL | 3 refills | Status: DC

## 2016-05-22 MED ORDER — LINAGLIPTIN 5 MG PO TABS
5.0000 mg | ORAL_TABLET | Freq: Every day | ORAL | 3 refills | Status: DC
Start: 2016-05-22 — End: 2016-07-06

## 2016-05-22 NOTE — Progress Notes (Signed)
Subjective:    Wesley Garcia is a 83 y.o.white male who presents for follow-up of Type 2 diabetes mellitus.   He stopped taking metoprolol due to weight gain. A1C: 7.6% Currently on Tresiba 12 units daily Currently on glipizide 64m daily. eGFR: 24.32   Home SMBG: all fasting - 82-121 (average 110) No hypoglycemia. Home BP values 104-144/56-80.  Average is >130/80.  Diet is struggling - he craves candied orange slices and gumdrops Walking on a treadmill for exercise.  [facility] [place]  Recent change in glasses. Wears hearing aides. No edema Denies problems with feet. Nocturia once per night. No dysuria Needs to drink more water - unsweet tea is his preference Weakness is regular cherry Coke 1-2 times per week.  Still having some heartburn with amlodipine  Review of Systems A comprehensive review of systems was negative except for: Gastrointestinal: positive for dyspepsia and reflux symptoms Genitourinary: positive for nocturia    Objective:    BP (!) 150/74   Pulse 72   Resp 14   Ht 5' 6"  (1.676 m)   Wt 156 lb (70.8 kg)   BMI 25.18 kg/m   General:  alert, cooperative and no distress  Oropharynx: normal findings: lips normal without lesions and gums healthy   Eyes:  negative findings: lids and lashes normal and conjunctivae and sclerae normal   Ears:  external ears normal, hearing aides in place        Lung: clear to auscultation bilaterally  Heart:  regular rate and rhythm     Extremities: extremities normal, atraumatic, no cyanosis or edema  Skin: warm and dry, no hyperpigmentation, vitiligo, or suspicious lesions     Neuro: mental status, speech normal, alert and oriented x3 and gait and station normal   Lab Review Glucose (mg/dL)  Date Value  12/03/2014 149 (H)  09/10/2014 145 (H)  05/07/2014 100 (H)   Glucose, Bld (mg/dL)  Date Value  05/15/2016 187 (H)  04/17/2014 167 (H)  04/16/2014 199 (H)   CO2  (mmol/L)  Date Value  05/15/2016 25  12/03/2014 24  09/10/2014 24   BUN (mg/dL)  Date Value  05/15/2016 24  12/03/2014 26  09/10/2014 25  05/07/2014 21   Creat (mg/dL)  Date Value  05/15/2016 2.04 (H)   Creatinine, Ser (mg/dL)  Date Value  12/03/2014 2.00 (H)  09/10/2014 2.10 (H)  05/07/2014 2.00 (H)       Assessment:    Diabetes Mellitus type II, under good control.  A1C above goal <7%, but not having any hypoglycemia. BP above target <130/80   Plan:    1.  Rx changes: 1.  stop glipizide due to probable lack of efficacy and risks of using SU class in renal dysfunction.  2.  Start Tradjenta 570monce daily.  3.  Stop amlodipine due to dyspepsia / reflux.  4.  Start carvedilol 6.2536mwice daily.  2.  Continue Tresiba 12 units daily. 3.  BP above target on amlodipine 5mg61mily.  He does not tolerate higher doses, and is still having complaints of dyspepsia on current dose.  Start carvedilol 6.25mg50mce daily.  Carvedilol safe and effective in renal dysfunction. 4.  Has not seen nephrologist since [place] is to follow up with Wesley Garcia.Wesley Garcia Counseled on nutrition goals. 6.  Praised exercise efforts.  Goal is 30-45 minutes 5 x week. 7.  RTC in 1 month to follow up medication changes.

## 2016-05-22 NOTE — Patient Instructions (Signed)
Stop amlodipine due to heartburn.  Start carvedilol 6.25mg  twice daily with meals.  Stop glipizide  Start Tradjenta 5mg  once daily.  Start checking blood sugar twice daily

## 2016-06-12 ENCOUNTER — Telehealth: Payer: Self-pay | Admitting: Internal Medicine

## 2016-06-12 NOTE — Telephone Encounter (Signed)
left msg asking pt to confirm this AWV appt w/ nurse. VDM (DD) °

## 2016-06-26 ENCOUNTER — Encounter: Payer: Self-pay | Admitting: Pharmacotherapy

## 2016-06-26 ENCOUNTER — Ambulatory Visit (INDEPENDENT_AMBULATORY_CARE_PROVIDER_SITE_OTHER): Payer: Medicare HMO | Admitting: Pharmacotherapy

## 2016-06-26 VITALS — BP 168/72 | HR 65 | Temp 97.5°F | Resp 17 | Ht 66.0 in | Wt 155.4 lb

## 2016-06-26 DIAGNOSIS — Z794 Long term (current) use of insulin: Secondary | ICD-10-CM

## 2016-06-26 DIAGNOSIS — N184 Chronic kidney disease, stage 4 (severe): Secondary | ICD-10-CM | POA: Diagnosis not present

## 2016-06-26 DIAGNOSIS — I10 Essential (primary) hypertension: Secondary | ICD-10-CM

## 2016-06-26 DIAGNOSIS — N183 Chronic kidney disease, stage 3 unspecified: Secondary | ICD-10-CM

## 2016-06-26 DIAGNOSIS — E1122 Type 2 diabetes mellitus with diabetic chronic kidney disease: Secondary | ICD-10-CM

## 2016-06-26 MED ORDER — CARVEDILOL 6.25 MG PO TABS: 6.2500 mg | ORAL_TABLET | Freq: Two times a day (BID) | ORAL | 3 refills | Status: DC
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
## 2016-06-26 NOTE — Patient Instructions (Signed)
Start taking Carvedilol 6.25mg  twice a day.

## 2016-06-30 ENCOUNTER — Other Ambulatory Visit: Payer: Self-pay | Admitting: *Deleted

## 2016-06-30 DIAGNOSIS — R69 Illness, unspecified: Secondary | ICD-10-CM | POA: Diagnosis not present

## 2016-06-30 MED ORDER — GLUCOSE BLOOD VI STRP: ORAL_STRIP | 11 refills | Status: DC

## 2016-06-30 NOTE — Telephone Encounter (Signed)
Patient requested Rx.

## 2016-07-03 DIAGNOSIS — D225 Melanocytic nevi of trunk: Secondary | ICD-10-CM | POA: Diagnosis not present

## 2016-07-03 DIAGNOSIS — L821 Other seborrheic keratosis: Secondary | ICD-10-CM | POA: Diagnosis not present

## 2016-07-03 DIAGNOSIS — L814 Other melanin hyperpigmentation: Secondary | ICD-10-CM | POA: Diagnosis not present

## 2016-07-03 DIAGNOSIS — L820 Inflamed seborrheic keratosis: Secondary | ICD-10-CM | POA: Diagnosis not present

## 2016-07-03 DIAGNOSIS — L570 Actinic keratosis: Secondary | ICD-10-CM | POA: Diagnosis not present

## 2016-07-06 ENCOUNTER — Other Ambulatory Visit: Payer: Self-pay

## 2016-07-06 MED ORDER — LINAGLIPTIN 5 MG PO TABS: 5.0000 mg | ORAL_TABLET | Freq: Every day | ORAL | 2 refills | Status: DC

## 2016-07-06 NOTE — Telephone Encounter (Signed)
Left message on voicemail for patient to return call when available   Verify pharmacy location.

## 2016-07-18 ENCOUNTER — Other Ambulatory Visit: Payer: Self-pay | Admitting: Nephrology

## 2016-07-18 DIAGNOSIS — N183 Chronic kidney disease, stage 3 unspecified: Secondary | ICD-10-CM

## 2016-07-18 DIAGNOSIS — E1129 Type 2 diabetes mellitus with other diabetic kidney complication: Secondary | ICD-10-CM | POA: Diagnosis not present

## 2016-07-18 DIAGNOSIS — N2581 Secondary hyperparathyroidism of renal origin: Secondary | ICD-10-CM | POA: Diagnosis not present

## 2016-08-04 ENCOUNTER — Other Ambulatory Visit: Payer: Self-pay | Admitting: *Deleted

## 2016-08-04 MED ORDER — INSULIN DEGLUDEC 200 UNIT/ML ~~LOC~~ SOPN: 12.0000 [IU] | PEN_INJECTOR | Freq: Every day | SUBCUTANEOUS | 5 refills | Status: DC

## 2016-08-04 NOTE — Telephone Encounter (Signed)
Patient requested to be sent to pharmacy.  

## 2016-08-14 ENCOUNTER — Encounter: Payer: Self-pay | Admitting: Internal Medicine

## 2016-08-14 ENCOUNTER — Ambulatory Visit
Admission: RE | Admit: 2016-08-14 | Discharge: 2016-08-14 | Disposition: A | Discharge status: Home / Self Care | Payer: Medicare HMO | Source: Ambulatory Visit | Attending: Nephrology | Admitting: Nephrology

## 2016-08-14 ENCOUNTER — Ambulatory Visit (INDEPENDENT_AMBULATORY_CARE_PROVIDER_SITE_OTHER): Payer: Medicare HMO | Admitting: Internal Medicine

## 2016-08-14 ENCOUNTER — Ambulatory Visit: Payer: Medicare HMO

## 2016-08-14 VITALS — BP 124/58 | HR 63 | Temp 97.4°F | Wt 152.0 lb

## 2016-08-14 DIAGNOSIS — E1169 Type 2 diabetes mellitus with other specified complication: Secondary | ICD-10-CM | POA: Diagnosis not present

## 2016-08-14 DIAGNOSIS — N184 Chronic kidney disease, stage 4 (severe): Secondary | ICD-10-CM

## 2016-08-14 DIAGNOSIS — M1711 Unilateral primary osteoarthritis, right knee: Secondary | ICD-10-CM

## 2016-08-14 DIAGNOSIS — Z23 Encounter for immunization: Secondary | ICD-10-CM

## 2016-08-14 DIAGNOSIS — H353220 Exudative age-related macular degeneration, left eye, stage unspecified: Secondary | ICD-10-CM | POA: Diagnosis not present

## 2016-08-14 DIAGNOSIS — N183 Chronic kidney disease, stage 3 unspecified: Secondary | ICD-10-CM

## 2016-08-14 DIAGNOSIS — E1122 Type 2 diabetes mellitus with diabetic chronic kidney disease: Secondary | ICD-10-CM

## 2016-08-14 DIAGNOSIS — E785 Hyperlipidemia, unspecified: Secondary | ICD-10-CM | POA: Diagnosis not present

## 2016-08-14 DIAGNOSIS — I495 Sick sinus syndrome: Secondary | ICD-10-CM | POA: Insufficient documentation

## 2016-08-14 DIAGNOSIS — Z794 Long term (current) use of insulin: Secondary | ICD-10-CM | POA: Diagnosis not present

## 2016-08-14 DIAGNOSIS — I10 Essential (primary) hypertension: Secondary | ICD-10-CM

## 2016-08-14 DIAGNOSIS — M1712 Unilateral primary osteoarthritis, left knee: Secondary | ICD-10-CM | POA: Insufficient documentation

## 2016-08-14 MED ORDER — ASPIRIN EC 81 MG PO TBEC: 81.0000 mg | DELAYED_RELEASE_TABLET | Freq: Every day | ORAL | 5 refills | Status: DC

## 2016-08-14 NOTE — Addendum Note (Signed)
Addended by: Despina Hidden on: 08/14/2016 02:01 PM   Modules accepted: Orders

## 2016-08-14 NOTE — Progress Notes (Signed)
Location:  [facility] clinic Provider:  Joelynn Dust L. Mariea Clonts, D.O., C.M.D.  Code Status: DNR reviewed with patient before--has [place] Goals of Care:  Advanced Directives 08/14/2016  Does Patient Have a Medical Advance Directive? No  Would patient like information on creating a medical advance directive? No - Patient declined    Chief Complaint  Patient presents with  . Medical Management of Chronic Issues    49mth follow-up    HPI: Patient is a 83 y.o. male seen today for medical management of chronic diseases.    He quit taking carvedilol after being dizzy for a whole day.  His wife says he still gets dizzy for 2-3 mins, then resolves.  sometimes when sitting and then gets up.  BP is now up.  At home it was 134/70.    Right knee now hurts all of the time.  Dr. Ninfa Linden took xrays of it before and didn't bother him that much at that time.  Cortisone shot was ineffective.  At that time, it needed to be "scraped off" and may be worse now.  Hurts most on the lateral aspect.  They go dancing a couple of times per week  He's had to sit down part way through and send her with somebody else.    CKDIV:  Is to go for his ultrasound of his kidney.    DMII:  Is not on baby asa.  On tradjenta and tresiba insulin.  Sugars running ok except if they eat out 134-150.  Around 100 or so at home 110.  Needs his foot exam.  Going to eye doctor in [place] appt was last year.    He went to the bathroom this am and was seeing bugs on the floor, and saw them on the wall if he looked up--he knows they are floaters.  After his devotion, they went away.  Had shots in his left eye a few years back that "didn't do any good".  Not on any vitamins.    Mentions left anterior hernia that he thinks he needs fixed.  Painful some.  Feels it but does not see it.    Past Medical History:  Diagnosis Date  . Chronic kidney disease (CKD), stage III (moderate)   . Diabetes mellitus   . Diverticulosis   . Hard of hearing     . Hemorrhoids   . Hx of adenomatous colonic polyps 2006  . Hypertension   . 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 CHOLECYSTECTOMY N/A 04/17/2014   Procedure: LAPAROSCOPIC CHOLECYSTECTOMY WITH INTRAOPERATIVE CHOLANGIOGRAM;  Surgeon: Erroll Luna, MD;  Location: [facility];  Service: General;  Laterality: N/A;  . COLONOSCOPY  11/02/2007  . MAXILLECTOMY Right 2003   SCCA alveolar ridge    No Known Allergies  Allergies as of 08/14/2016   No Known Allergies     Medication List       Accurate as of 08/14/16 10:10 AM. Always use your most recent med list.          carvedilol 6.25 MG tablet Commonly known as:  COREG Take 1 tablet (6.25 mg total) by mouth 2 (two) times daily with a meal.   EASY COMFORT PEN NEEDLES 31G X 5 MM Misc Generic drug:  Insulin Pen Needle use as directed TO INJECT INSULIN TO CONTROL BLOOD SUGAR   glucose blood test strip One Touch Verio test strip used to test blood sugar three times daily. Dx:  E11.22   Insulin Degludec 200 UNIT/ML Sopn Commonly known as:  TRESIBA FLEXTOUCH Inject 12 Units into the skin daily.   INSULIN SYRINGE 1CC/31GX5/16" 31G X 5/16" 1 ML Misc Use as Directed with insulin.   linagliptin 5 MG Tabs tablet Commonly known as:  TRADJENTA Take 1 tablet (5 mg total) by mouth daily.       Review of Systems:  Review of Systems  Constitutional: Negative for chills, fever and malaise/fatigue.  HENT: Positive for congestion and hearing loss.   Eyes: Negative for blurred vision.       Floaters, h/o macular off drops and no recent injections  Respiratory: Negative for shortness of breath.   Cardiovascular: Negative for chest pain and palpitations.  Gastrointestinal: Negative for abdominal pain, blood in stool and melena.  Genitourinary: Negative for dysuria.  Musculoskeletal: Negative for falls and myalgias.  Skin: Negative for itching and rash.  Neurological:  Positive for dizziness. Negative for loss of consciousness and weakness.       On standing at times, less than when on coreg  Psychiatric/Behavioral: Negative for depression and memory loss. The patient is nervous/anxious. The patient does not have insomnia.     Health Maintenance  Topic Date Due  . FOOT EXAM  01/16/1940  . OPHTHALMOLOGY EXAM  01/16/1940  . PNA vac Low Risk Adult (2 of 2 - PPSV23) 09/02/2014  . URINE MICROALBUMIN  09/10/2015  . COLONOSCOPY  01/29/2016  . INFLUENZA VACCINE  10/04/2016  . HEMOGLOBIN A1C  11/15/2016  . TETANUS/TDAP  04/16/2021    Physical Exam: Vitals:   08/14/16 1002  BP: (!) 150/80  Pulse: 63  Temp: 97.4 F (36.3 C)  TempSrc: Oral  SpO2: 98%  Weight: 152 lb (68.9 kg)   Body mass index is 24.53 kg/m. Physical Exam  Constitutional: He is oriented to person, place, and time. He appears well-developed and well-nourished. No distress.  HENT:  Very HOH chronically  Cardiovascular: Normal rate, regular rhythm, normal heart sounds and intact distal pulses.   Pulmonary/Chest: Effort normal and breath sounds normal. No respiratory distress.  Abdominal: Soft. Bowel sounds are normal.  Musculoskeletal: Normal range of motion.  Foot exam done today  Neurological: He is alert and oriented to person, place, and time. No cranial nerve deficit. Coordination normal.  Skin: Skin is warm and dry. Capillary refill takes less than 2 seconds.  Psychiatric: He has a normal mood and affect.    Labs reviewed: Basic Metabolic Panel:  Recent Labs  05/15/16 1124  NA 141  K 4.2  CL 106  CO2 25  GLUCOSE 187*  BUN 24  CREATININE 2.04*  CALCIUM 9.1   Liver Function Tests: No results for input(s): AST, ALT, ALKPHOS, BILITOT, PROT, ALBUMIN in the last 8760 hours. No results for input(s): LIPASE, AMYLASE in the last 8760 hours. No results for input(s): AMMONIA in the last 8760 hours. CBC:  Recent Labs  05/15/16 1124  WBC 8.9  NEUTROABS 5,785  HGB  13.5  HCT 40.3  MCV 92.9  PLT 213   Lipid Panel: No results for input(s): CHOL, HDL, LDLCALC, TRIG, CHOLHDL, LDLDIRECT in the last 8760 hours. Lab Results  Component Value Date   HGBA1C 7.6 (H) 05/15/2016    Assessment/Plan 1. Controlled type 2 diabetes mellitus with stage 4 chronic kidney disease, with long-term current use of insulin ([facility]) -not sure why he's not on baby asa, cont on current insulin regimen and tradjenta, we don't have tresiba samples - aspirin EC 81 MG  tablet; Take 1 tablet (81 mg total) by mouth daily.  Dispense: 30 tablet; Refill: 5 - Microalbumin / creatinine urine ratio; Future - Lipid panel; Future well controlled Lab Results  Component Value Date   HGBA1C 7.6 (H) 05/15/2016  -cont current regimen and monitor -not on ace due to CKDIV   2. Chronic kidney disease, stage 4 (severe) ([facility]) -follows with Dr. [name]  -3. Hyperlipidemia associated with type 2 diabetes mellitus ([place]) -needs recheck of labs, not on statin due to prior muscle weakness and myalgias with it - Lipid panel; Future  4. Benign essential hypertension -bp at goal upon recheck, has orthostasis so will not try to lower bp more -was on coreg for tachy brady which he stopped due to dizziness  5. Exudative age-related macular degeneration of left eye, unspecified stage ([place]) -advised to follow up with ophtho b/c of increased floaters this am  6. Primary osteoarthritis of right knee - requests referral to Dr. Ninfa Linden due to increased difficulty with pain when dancing causing him to stop - AMB referral to orthopedics  7. Need for vaccination against Streptococcus pneumoniae -pneumovax given today  8. Tachy-brady syndrome ([place]) -ongoing, HR 50s now so coreg would only lower it further -should have cardiology f/u if continues with dizziness--he is resistant to this  Labs/tests ordered:   Orders Placed This Encounter  Procedures  . Microalbumin / creatinine urine ratio    Standing  Status:   Future    Standing Expiration Date:   08/14/2017  . Lipid panel    Standing Status:   Future    Standing Expiration Date:   08/14/2017    Order Specific Question:   Has the patient fasted?    Answer:   Yes  . AMB referral to orthopedics    Referral Priority:   Routine    Referral Type:   Consultation    Number of Visits Requested:   1  already has hba1c and cmp ordered by Tivis Ringer  Next appt:  12/11/2016   Sayler Mickiewicz L. Ourania Hamler, D.O. [facility] [address], [place] [address] Cell Phone (Mon-Fri 8am-5pm):  [phone] On Call:  [phone] & follow prompts after 5pm & weekends Office Phone:  [phone] Office Fax:  [phone]

## 2016-08-14 NOTE — Patient Instructions (Signed)
Follow up with eye doctor due to floaters.

## 2016-08-18 ENCOUNTER — Telehealth: Payer: Self-pay

## 2016-08-18 DIAGNOSIS — Z0100 Encounter for examination of eyes and vision without abnormal findings: Secondary | ICD-10-CM

## 2016-08-18 NOTE — Telephone Encounter (Signed)
Order placed and message routed to referral coordinator

## 2016-08-18 NOTE — Telephone Encounter (Signed)
Patient has a pending appointment on Monday with Dr.Odom @ 2:45 pm. Patient needs referral order placed.

## 2016-08-21 DIAGNOSIS — H40023 Open angle with borderline findings, high risk, bilateral: Secondary | ICD-10-CM | POA: Diagnosis not present

## 2016-08-21 DIAGNOSIS — E113393 Type 2 diabetes mellitus with moderate nonproliferative diabetic retinopathy without macular edema, bilateral: Secondary | ICD-10-CM | POA: Diagnosis not present

## 2016-08-21 DIAGNOSIS — Z794 Long term (current) use of insulin: Secondary | ICD-10-CM | POA: Diagnosis not present

## 2016-08-23 ENCOUNTER — Telehealth: Payer: Self-pay | Admitting: *Deleted

## 2016-08-23 DIAGNOSIS — Z0100 Encounter for examination of eyes and vision without abnormal findings: Secondary | ICD-10-CM

## 2016-08-23 NOTE — Telephone Encounter (Signed)
Patient wife called and stated that patient needed a referral for patient to see Eye Dr on 08/31/16 at [facility]. Referral placed.

## 2016-08-30 ENCOUNTER — Ambulatory Visit (INDEPENDENT_AMBULATORY_CARE_PROVIDER_SITE_OTHER): Payer: Medicare HMO

## 2016-08-30 ENCOUNTER — Ambulatory Visit (INDEPENDENT_AMBULATORY_CARE_PROVIDER_SITE_OTHER): Payer: Medicare HMO | Admitting: Physician Assistant

## 2016-08-30 DIAGNOSIS — M25561 Pain in right knee: Secondary | ICD-10-CM

## 2016-08-30 DIAGNOSIS — G8929 Other chronic pain: Secondary | ICD-10-CM | POA: Diagnosis not present

## 2016-08-30 MED ORDER — LIDOCAINE HCL 1 % IJ SOLN
1.0000 mL | INTRAMUSCULAR | Status: AC | PRN
  Administered 2016-08-30: 1 mL

## 2016-08-30 MED ORDER — METHYLPREDNISOLONE ACETATE 40 MG/ML IJ SUSP
40.0000 mg | INTRAMUSCULAR | Status: AC | PRN
  Administered 2016-08-30: 40 mg via INTRA_ARTICULAR

## 2016-08-30 NOTE — Progress Notes (Signed)
Office Visit Note   Patient: Wesley Garcia           Date of Birth: 1929/10/31           MRN: [id number] Visit Date: 08/30/2016              Requested by: Gayland Curry, DO [place], [place] [address] PCP: Gayland Curry, DO   Assessment & Plan: Visit Diagnoses:  1. Chronic pain of right knee     Plan: Handout was given on Monovisc he will think about whether or not he wants this injection S [place]. Also discussed with him partial knee replacement which he would be a candidate for medial also think about this and let us know if he would like to proceed with this at any point time. Discussed with him the natural anti-inflammatories he'll discuss this with his providers prior to beginning these supplements.  Follow-Up Instructions: Return if symptoms worsen or fail to improve.   Orders:  Orders Placed This Encounter  Procedures  . Large Joint Injection/Arthrocentesis  . XR Knee 1-2 Views Right   No orders of the defined types were placed in this encounter.     Procedures: Large Joint Inj Date/Time: 08/30/2016 10:58 AM Performed by: Pete Pelt Authorized by: Pete Pelt   Consent Given by:  Patient Indications:  Pain Location:  Knee Site:  R knee Needle Size:  22 G Approach:  Anterolateral Ultrasound Guidance: No   Fluoroscopic Guidance: No   Medications:  1 mL lidocaine 1 %; 40 mg methylPREDNISolone acetate 40 MG/ML Aspiration Attempted: No   Patient tolerance:  Patient tolerated the procedure well with no immediate complications     Clinical Data: No additional findings.   Subjective: LEFT KNEE PAIN  HPI [name] is well known to Dr. Trevor Mace service comes in today for right knee pain for 10 years. States pain overall is getting worse. He states range of motion is very painful and now he is having pain whenever he is ambulating. He's tried cortisone injections in the past which he states did not help much. He is asking what else  could be done. He is diabetic well-controlled and stage IV renal failure. He's unable take NSAIDs. No known injury to the right knee. States the right knee does give way with him at times. His pain continues medial aspect the knee only. Review of Systems  See history of present illness otherwise negative Objective: Vital Signs: There were no vitals taken for this visit.  Physical Exam  Constitutional: He is oriented to person, place, and time. He appears well-developed and well-nourished. No distress.  Neurological: He is alert and oriented to person, place, and time.  Psychiatric: He has a normal mood and affect. His behavior is normal.    Ortho Exam Right knee has full extension and full flexion. Tenderness along the medial joint line only. No effusion abnormal warmth erythema or edema. No instability with valgus varus stressing. Anterior drawer is negative. Specialty Comments:  No specialty comments available.  Imaging: Xr Knee 1-2 Views Right  Result Date: 08/30/2016 AP lateral views of the right knee: No acute fracture. Lateral patellofemoral joint lines are well maintained. At the medial joint line is near bone-on-bone with significant osteophytes off the femoral condyle and tibial plateau. No other bony abnormalities.    PMFS History: Patient Active Problem List   Diagnosis Date Noted  . Chronic kidney disease, stage 4 (severe) ([facility]) 08/14/2016  .  Exudative age-related macular degeneration of left eye ([facility]) 08/14/2016  . Tachy-brady syndrome ([facility]) 08/14/2016  . Primary osteoarthritis of right knee 08/14/2016  . Benign essential hypertension 08/14/2016  . RUQ pain 04/17/2014  . DM (diabetes mellitus) type II controlled with renal manifestation ([facility]) 04/17/2014  . Acute cholecystitis 04/17/2014  . Gallstones   . Hyperlipidemia associated with type 2 diabetes mellitus ([facility]) 06/10/2012  . Hypertensive kidney disease, benign(403.1) 06/10/2012  . Personal history of colonic  adenomas 09/29/2004   Past Medical History:  Diagnosis Date  . Chronic kidney disease (CKD), stage III (moderate)   . Diabetes mellitus   . Diverticulosis   . Hard of hearing   . Hemorrhoids   . Hx of adenomatous colonic polyps 2006  . Hypertension   . 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 Cancer Mother        ?  Marland Kitchen Bone cancer Sister   . Diabetes Brother   . Kidney disease Sister   . Colon cancer Neg Hx     Past Surgical History:  Procedure Laterality Date  . CHOLECYSTECTOMY N/A 04/17/2014   Procedure: LAPAROSCOPIC CHOLECYSTECTOMY WITH INTRAOPERATIVE CHOLANGIOGRAM;  Surgeon: Erroll Luna, MD;  Location: [facility];  Service: General;  Laterality: N/A;  . COLONOSCOPY  11/02/2007  . MAXILLECTOMY Right 2003   SCCA alveolar ridge   Social History   Occupational History  . Retired    Social History Main Topics  . Smoking status: Former Smoker    Types: Cigars    Quit date: 04/07/2000  . Smokeless tobacco: Never Used  . Alcohol use Yes     Comment: socially  . Drug use: No  . Sexual activity: No

## 2016-09-04 ENCOUNTER — Ambulatory Visit: Payer: Medicare HMO | Admitting: Pharmacotherapy

## 2016-09-07 ENCOUNTER — Telehealth: Payer: Self-pay

## 2016-09-07 DIAGNOSIS — H32 Chorioretinal disorders in diseases classified elsewhere: Secondary | ICD-10-CM

## 2016-09-07 DIAGNOSIS — B394 Histoplasmosis capsulati, unspecified: Secondary | ICD-10-CM

## 2016-09-07 DIAGNOSIS — E113393 Type 2 diabetes mellitus with moderate nonproliferative diabetic retinopathy without macular edema, bilateral: Secondary | ICD-10-CM

## 2016-09-07 NOTE — Telephone Encounter (Signed)
I called [facility] again to clarify diagnosis, diagnosis clarified and referral order complete

## 2016-09-07 NOTE — Telephone Encounter (Signed)
Patient's wife called, patient is at [facility] office now and needs a referral. Patient's wife called last week requesting order for referral.  Patient seen Dr.Odom and Dr.Odom requested patient see Dr.Cousins. Gaynell informed I will follow-up with Dr.Odom's office for we have not received any correspondence and do not know the reason for referral.  Called Dr.Odom's office, receptionist indicated Dr.Odom's OV note was incomplete and she will need to consult with his assistance for diagnosis.  I was given diagnosis of E11.3393, H32 NPI for Dr.Cousins [phone], Procedure Code [phone], number of visits: 1  After ending call with receptionist and placing referral order I was given 30 options or more when associating diagnosis E11.3393, I then had to call Dr.Odom's office again to get the specifics of that diagnosis, I had to leave a message, no answer.

## 2016-09-12 DIAGNOSIS — R69 Illness, unspecified: Secondary | ICD-10-CM | POA: Diagnosis not present

## 2016-09-28 ENCOUNTER — Telehealth: Payer: Self-pay | Admitting: Internal Medicine

## 2016-09-28 NOTE — Telephone Encounter (Signed)
Left msg asking pt to call and reschedule AWV. VDM (DD)

## 2016-09-29 ENCOUNTER — Other Ambulatory Visit: Payer: Self-pay | Admitting: *Deleted

## 2016-09-29 MED ORDER — INSULIN DEGLUDEC 200 UNIT/ML ~~LOC~~ SOPN: 12.0000 [IU] | PEN_INJECTOR | Freq: Every day | SUBCUTANEOUS | 5 refills | Status: DC

## 2016-09-29 NOTE — Telephone Encounter (Signed)
Patient wife requested to be sent to pharmacy.

## 2016-10-16 DIAGNOSIS — R69 Illness, unspecified: Secondary | ICD-10-CM | POA: Diagnosis not present

## 2016-11-16 ENCOUNTER — Other Ambulatory Visit: Payer: Self-pay | Admitting: Internal Medicine

## 2016-11-29 ENCOUNTER — Ambulatory Visit (INDEPENDENT_AMBULATORY_CARE_PROVIDER_SITE_OTHER): Payer: Medicare HMO | Admitting: Physician Assistant

## 2016-11-29 DIAGNOSIS — M1711 Unilateral primary osteoarthritis, right knee: Secondary | ICD-10-CM

## 2016-11-29 MED ORDER — METHYLPREDNISOLONE ACETATE 40 MG/ML IJ SUSP
40.0000 mg | INTRAMUSCULAR | Status: AC | PRN
  Administered 2016-11-29: 40 mg via INTRA_ARTICULAR

## 2016-11-29 MED ORDER — BUPIVACAINE HCL 0.25 % IJ SOLN
4.0000 mL | INTRAMUSCULAR | Status: AC | PRN
  Administered 2016-11-29: 4 mL via INTRA_ARTICULAR

## 2016-11-29 NOTE — Progress Notes (Signed)
   Procedure Note  Patient: [name]             Date of Birth: 01-Jul-1929           MRN: [id number]             Visit Date: 11/29/2016  History of present illness: [name] returns today now just over 3 months status post right knee injection for right knee arthritis. He states he is having minimal pain in the knee but just would like another injection the pain from returning. He has had no new injury to the knee. He does state that the last injection caused his glucose levels to rise to approximately 170.  Physical exam: Right knee good range of motion without pain. Nontender along medial joint line. No instability. No effusion abnormal warmth erythema.  Procedures: Visit Diagnoses: Primary osteoarthritis of right knee  Large Joint Inj Date/Time: 11/29/2016 10:13 AM Performed by: Pete Pelt Authorized by: Pete Pelt   Consent Given by:  Patient Indications:  Pain Location:  Knee Site:  R knee Needle Size:  22 G Approach:  Anterolateral Ultrasound Guidance: No   Fluoroscopic Guidance: No   Medications:  40 mg methylPREDNISolone acetate 40 MG/ML; 4 mL bupivacaine 0.25 % Aspiration Attempted: No   Patient tolerance:  Patient tolerated the procedure well with no immediate complications   Plan: He'll follow up with [place] on an as needed as needed basis. He understands he can have injections no more frequent than every 3 months.

## 2016-12-07 ENCOUNTER — Other Ambulatory Visit: Payer: Medicare HMO

## 2016-12-07 DIAGNOSIS — N184 Chronic kidney disease, stage 4 (severe): Secondary | ICD-10-CM

## 2016-12-07 DIAGNOSIS — E1122 Type 2 diabetes mellitus with diabetic chronic kidney disease: Secondary | ICD-10-CM | POA: Diagnosis not present

## 2016-12-07 DIAGNOSIS — E785 Hyperlipidemia, unspecified: Secondary | ICD-10-CM

## 2016-12-07 DIAGNOSIS — Z794 Long term (current) use of insulin: Secondary | ICD-10-CM | POA: Diagnosis not present

## 2016-12-07 DIAGNOSIS — E1169 Type 2 diabetes mellitus with other specified complication: Secondary | ICD-10-CM | POA: Diagnosis not present

## 2016-12-11 ENCOUNTER — Encounter: Payer: Self-pay | Admitting: Internal Medicine

## 2016-12-11 ENCOUNTER — Ambulatory Visit (INDEPENDENT_AMBULATORY_CARE_PROVIDER_SITE_OTHER): Payer: Medicare HMO | Admitting: Internal Medicine

## 2016-12-11 VITALS — BP 170/80 | HR 52 | Temp 97.7°F | Wt 151.0 lb

## 2016-12-11 DIAGNOSIS — E1122 Type 2 diabetes mellitus with diabetic chronic kidney disease: Secondary | ICD-10-CM

## 2016-12-11 DIAGNOSIS — R2689 Other abnormalities of gait and mobility: Secondary | ICD-10-CM

## 2016-12-11 DIAGNOSIS — N184 Chronic kidney disease, stage 4 (severe): Secondary | ICD-10-CM | POA: Diagnosis not present

## 2016-12-11 DIAGNOSIS — R42 Dizziness and giddiness: Secondary | ICD-10-CM | POA: Diagnosis not present

## 2016-12-11 DIAGNOSIS — Z23 Encounter for immunization: Secondary | ICD-10-CM

## 2016-12-11 DIAGNOSIS — I10 Essential (primary) hypertension: Secondary | ICD-10-CM | POA: Diagnosis not present

## 2016-12-11 DIAGNOSIS — E1169 Type 2 diabetes mellitus with other specified complication: Secondary | ICD-10-CM | POA: Diagnosis not present

## 2016-12-11 DIAGNOSIS — E113393 Type 2 diabetes mellitus with moderate nonproliferative diabetic retinopathy without macular edema, bilateral: Secondary | ICD-10-CM | POA: Diagnosis not present

## 2016-12-11 DIAGNOSIS — Z794 Long term (current) use of insulin: Secondary | ICD-10-CM | POA: Diagnosis not present

## 2016-12-11 DIAGNOSIS — E785 Hyperlipidemia, unspecified: Secondary | ICD-10-CM

## 2016-12-11 NOTE — Progress Notes (Signed)
Location:   [facility]   Place of Service:   Clinic  Provider: Rasheda Ledger L. Mariea Clonts, D.O., C.M.D.  Code Status: Full Code Goals of Care:  Advanced Directives 08/14/2016  Does Patient Have a Medical Advance Directive? No  Would patient like information on creating a medical advance directive? No - Patient declined   Chief Complaint  Patient presents with  . Medical Management of Chronic Issues    22mth follow-up, discuss labs, dizziness for a year, balance not good    HPI: Patient is a 83 y.o. male seen today for medical management of chronic diseases. Has been having an increase in dizziness. States that sometimes it is when he stands up to quickly and sometimes its when he is just doing regular things. Says that sometimes it feels like its his sugar as dropped but then he checked his blood sugar and the blood sugar is fine. Was worked up by cardiology in [place] and was diagnosed with tachybrady syndrome. [place] about a month ago but no trauma.    Hypertension- Says that he has stopped taking the coreg and checks his blood pressure at home.  Has been as high as SBP 160s but stays generally stays 130-140s. Says that his diet is good but 24 hour recall includes a hamburger from [employer] and a bag of potato chips. His wife says that they do eat a lot chicken and vegetables.   Rechecked BP 140/90.    CKD-  Has an appointment with nephrology in November. Sees Dr. Sabra Heck.  Diabetes mellitus- A1c results are pending. Takes Tradjenta 5mg  tablets daily. Says that his highest blood sugar is 140s and lowest 60. He checks his blood sugar every morning before breakfast. Says that his glucometer is at least 81 years old and he questions its accuracy. When his blood sugar gets in the 60s he does have symptoms. Had diabetic eye exam.  Has retinopathy.              Anxious and didn't think he could relax.  Orthostatics done which were positive (see extended vitals).    Past Medical History:  Diagnosis Date  . Chronic  kidney disease (CKD), stage III (moderate) ([facility])   . Diabetes mellitus   . Diverticulosis   . Hard of hearing   . Hemorrhoids   . Hx of adenomatous colonic polyps 2006  . Hypertension   . Squamous cell carcinoma of hard palate ([place]) 2003    Past Surgical History:  Procedure Laterality Date  . CHOLECYSTECTOMY N/A 04/17/2014   Procedure: LAPAROSCOPIC CHOLECYSTECTOMY WITH INTRAOPERATIVE CHOLANGIOGRAM;  Surgeon: Erroll Luna, MD;  Location: [facility];  Service: General;  Laterality: N/A;  . COLONOSCOPY  11/02/2007  . MAXILLECTOMY Right 2003   SCCA alveolar ridge    No Known Allergies  Outpatient Encounter Prescriptions as of 12/11/2016  Medication Sig  . EASY COMFORT PEN NEEDLES 31G X 5 MM MISC use as directed TO INJECT INSULIN TO CONTROL BLOOD SUGAR  . glucose blood test strip One Touch Verio test strip used to test blood sugar three times daily. Dx: E11.22  . Insulin Degludec (TRESIBA FLEXTOUCH) 200 UNIT/ML SOPN Inject 12 Units into the skin daily.  . TRADJENTA 5 MG TABS tablet TAKE 1 TABLET BY MOUTH EVERY DAY  . [DISCONTINUED] Insulin Syringe-Needle U-100 (INSULIN SYRINGE 1CC/31GX5/16") 31G X 5/16" 1 ML MISC Use as Directed with insulin.   No facility-administered encounter medications on file as of 12/11/2016.     Review of Systems:  Review of  Systems  Constitutional: Negative for fever and weight loss.  HENT: Positive for hearing loss. Negative for congestion.   Eyes: Positive for blurred vision. Negative for double vision.  Respiratory: Positive for shortness of breath. Negative for wheezing.   Cardiovascular: Negative for chest pain, palpitations and leg swelling.  Gastrointestinal: Positive for heartburn. Negative for abdominal pain, constipation, diarrhea and nausea.  Genitourinary: Negative for dysuria.  Musculoskeletal: Positive for joint pain.  Neurological: Positive for dizziness. Negative for loss of consciousness and weakness.       "lightheadedness"    Endo/Heme/Allergies: Bruises/bleeds easily.  Psychiatric/Behavioral: Positive for memory loss. Negative for depression and hallucinations. The patient does not have insomnia.     Health Maintenance  Topic Date Due  . FOOT EXAM  01/16/1942  . OPHTHALMOLOGY EXAM  01/16/1942  . COLONOSCOPY  01/29/2016  . INFLUENZA VACCINE  10/04/2016  . HEMOGLOBIN A1C  11/15/2016  . URINE MICROALBUMIN  12/07/2017  . TETANUS/TDAP  04/16/2021  . PNA vac Low Risk Adult  Completed    Physical Exam: Vitals:   12/11/16 0954  BP: (!) 170/80  Pulse: (!) 52  Temp: 97.7 F (36.5 C)  TempSrc: Oral  SpO2: 97%  Weight: 151 lb (68.5 kg)   Body mass index is 24.37 kg/m. Physical Exam  Constitutional: He is oriented to person, place, and time. He appears well-developed and well-nourished.  Cardiovascular: Normal rate, regular rhythm, normal heart sounds and intact distal pulses.   No murmur heard. Pulmonary/Chest: Effort normal and breath sounds normal.  Abdominal: Bowel sounds are normal.  Musculoskeletal: Normal range of motion.       Right foot: There is normal range of motion and no deformity.       Left foot: There is normal range of motion and no deformity.  Feet:  Right Foot:  Protective Sensation: 7 sites tested. 7 sites sensed.  Skin Integrity: Negative for skin breakdown or dry skin.  Left Foot:  Protective Sensation: 7 sites tested. 7 sites sensed.  Skin Integrity: Negative for skin breakdown or dry skin.  Neurological: He is alert and oriented to person, place, and time.  Unsteady gait, poor historian  Skin: Skin is warm and dry.  Psychiatric: He has a normal mood and affect. His behavior is normal. Judgment and thought content normal.    Labs reviewed: Basic Metabolic Panel:  Recent Labs  05/15/16 1124  NA 141  K 4.2  CL 106  CO2 25  GLUCOSE 187*  BUN 24  CREATININE 2.04*  CALCIUM 9.1   Liver Function Tests: No results for input(s): AST, ALT, ALKPHOS, BILITOT, PROT,  ALBUMIN in the last 8760 hours. No results for input(s): LIPASE, AMYLASE in the last 8760 hours. No results for input(s): AMMONIA in the last 8760 hours. CBC:  Recent Labs  05/15/16 1124  WBC 8.9  NEUTROABS 5,785  HGB 13.5  HCT 40.3  MCV 92.9  PLT 213   Lipid Panel:  Recent Labs  12/07/16 0816  CHOL 83  HDL 62  TRIG 31  CHOLHDL 1.3   Lab Results  Component Value Date   HGBA1C 7.6 (H) 05/15/2016    Procedures since last visit: No results found.  Assessment/Plan 1. Benign essential hypertension Not taking his coreg anymore d/t dizziness. Discussed sodium intake and reading labels. Wife rinses off canned vegetables.  They eat fast food though and had it last evening.    2. Hyperlipidemia associated with type 2 diabetes mellitus ([place]) Diet controlled.  Pt 86 and weak  in legs.  Avoiding statins at this point.  3. Controlled type 2 diabetes mellitus with stage 4 chronic kidney disease, with long-term current use of insulin ([facility]) A1c being checked today--he did not come in when he was meant to for his lab. Continue Tradjenta and tresiba insulin regimen.   Samples of tradjenta provided.  We haven't had tresiba in months or years.  4. Moderate nonproliferative diabetic retinopathy of both eyes without macular edema associated with type 2 diabetes mellitus ([place]) -follows with ophtho and had diabetic eye exam after referral done--need note  5. Postural lightheadedness -was orthostatic today from lying to sitting -must hydrate better--counseled to drink 6 8oz glasses of water per day  6. Balance disorder - has been going on for a while, unsteady and needs appropriate exercises - Ambulatory referral to Physical Therapy placed for [facility] by Forestine Na  Labs/tests ordered:   Orders Placed This Encounter  Procedures  . Ambulatory referral to Physical Therapy    Referral Priority:   Routine    Referral Type:   Physical Medicine    Referral Reason:   Specialty  Services Required    Requested Specialty:   Physical Therapy    Number of Visits Requested:   1   Next appt:  4 mos med mgt, labs before  Johnny Latu L. Marisol Giambra, D.O. [facility] [address], [place] [address] Cell Phone (Mon-Fri 8am-5pm):  [phone] On Call:  [phone] & follow prompts after 5pm & weekends Office Phone:  [phone] Office Fax:  [phone]

## 2016-12-11 NOTE — Patient Instructions (Addendum)
Go get your flu shot at the pharmacy and call to let us know when received.  I've referred you for physical therapy in [place] for your balance.

## 2016-12-12 LAB — LIPID PANEL
Cholesterol: 83 mg/dL (ref <200)
HDL: 62 mg/dL (ref >40)
LDL Cholesterol (Calc): 12 mg/dL (calc)
Non-HDL Cholesterol (Calc): 21 mg/dL (calc) (ref <130)
Total CHOL/HDL Ratio: 1.3 (calc) (ref <5.0)
Triglycerides: 31 mg/dL (ref <150)

## 2016-12-12 LAB — TEST AUTHORIZATION

## 2016-12-12 LAB — MICROALBUMIN / CREATININE URINE RATIO
Creatinine, Urine: 166 mg/dL (ref 20–320)
Microalb Creat Ratio: 34 mcg/mg creat — ABNORMAL HIGH (ref <30)
Microalb, Ur: 5.6 mg/dL

## 2016-12-12 LAB — HEMOGLOBIN A1C
Hgb A1c MFr Bld: 6.6 % of total Hgb — ABNORMAL HIGH (ref <5.7)
Mean Plasma Glucose: 143 (calc)
eAG (mmol/L): 7.9 (calc)

## 2016-12-12 LAB — EXTRA LAV TOP TUBE

## 2016-12-18 ENCOUNTER — Other Ambulatory Visit: Payer: Self-pay | Admitting: Internal Medicine

## 2016-12-18 ENCOUNTER — Emergency Department (HOSPITAL_COMMUNITY): Payer: Medicare HMO

## 2016-12-18 ENCOUNTER — Emergency Department (HOSPITAL_COMMUNITY)
Admission: EM | Admit: 2016-12-18 | Discharge: 2016-12-18 | Disposition: A | Discharge status: Home Health | Payer: Medicare HMO | Attending: Emergency Medicine | Admitting: Emergency Medicine

## 2016-12-18 ENCOUNTER — Encounter (HOSPITAL_COMMUNITY): Payer: Self-pay

## 2016-12-18 DIAGNOSIS — E1322 Other specified diabetes mellitus with diabetic chronic kidney disease: Secondary | ICD-10-CM | POA: Diagnosis not present

## 2016-12-18 DIAGNOSIS — L03317 Cellulitis of buttock: Secondary | ICD-10-CM | POA: Diagnosis not present

## 2016-12-18 DIAGNOSIS — R21 Rash and other nonspecific skin eruption: Secondary | ICD-10-CM | POA: Diagnosis present

## 2016-12-18 DIAGNOSIS — K862 Cyst of pancreas: Secondary | ICD-10-CM | POA: Insufficient documentation

## 2016-12-18 DIAGNOSIS — Z87891 Personal history of nicotine dependence: Secondary | ICD-10-CM | POA: Insufficient documentation

## 2016-12-18 DIAGNOSIS — N183 Chronic kidney disease, stage 3 (moderate): Secondary | ICD-10-CM | POA: Insufficient documentation

## 2016-12-18 DIAGNOSIS — I129 Hypertensive chronic kidney disease with stage 1 through stage 4 chronic kidney disease, or unspecified chronic kidney disease: Secondary | ICD-10-CM | POA: Diagnosis not present

## 2016-12-18 DIAGNOSIS — K5790 Diverticulosis of intestine, part unspecified, without perforation or abscess without bleeding: Secondary | ICD-10-CM | POA: Diagnosis not present

## 2016-12-18 LAB — I-STAT CHEM 8, ED
BUN: 28 mg/dL — ABNORMAL HIGH (ref 6–20)
CALCIUM ION: 1.19 mmol/L (ref 1.15–1.40)
CHLORIDE: 106 mmol/L (ref 101–111)
Creatinine, Ser: 1.9 mg/dL — ABNORMAL HIGH (ref 0.61–1.24)
GLUCOSE: 123 mg/dL — AB (ref 65–99)
HCT: 41 % (ref 39.0–52.0)
Hemoglobin: 13.9 g/dL (ref 13.0–17.0)
Potassium: 4.1 mmol/L (ref 3.5–5.1)
Sodium: 142 mmol/L (ref 135–145)
TCO2: 26 mmol/L (ref 22–32)

## 2016-12-18 LAB — CBC WITH DIFFERENTIAL/PLATELET
BASOS PCT: 0 %
Basophils Absolute: 0 10*3/uL (ref 0.0–0.1)
EOS ABS: 0.1 10*3/uL (ref 0.0–0.7)
EOS PCT: 1 %
HCT: 41.6 % (ref 39.0–52.0)
Hemoglobin: 13.7 g/dL (ref 13.0–17.0)
Lymphocytes Relative: 17 %
Lymphs Abs: 1.6 10*3/uL (ref 0.7–4.0)
MCH: 31.2 pg (ref 26.0–34.0)
MCHC: 32.9 g/dL (ref 30.0–36.0)
MCV: 94.8 fL (ref 78.0–100.0)
MONOS PCT: 11 %
Monocytes Absolute: 1.1 10*3/uL — ABNORMAL HIGH (ref 0.1–1.0)
Neutro Abs: 7 10*3/uL (ref 1.7–7.7)
Neutrophils Relative %: 71 %
PLATELETS: 176 10*3/uL (ref 150–400)
RBC: 4.39 MIL/uL (ref 4.22–5.81)
RDW: 13.7 % (ref 11.5–15.5)
WBC: 9.8 10*3/uL (ref 4.0–10.5)

## 2016-12-18 LAB — COMPREHENSIVE METABOLIC PANEL
ALBUMIN: 3.8 g/dL (ref 3.5–5.0)
ALT: 11 U/L — ABNORMAL LOW (ref 17–63)
ANION GAP: 8 (ref 5–15)
AST: 14 U/L — ABNORMAL LOW (ref 15–41)
Alkaline Phosphatase: 64 U/L (ref 38–126)
BUN: 26 mg/dL — ABNORMAL HIGH (ref 6–20)
CHLORIDE: 105 mmol/L (ref 101–111)
CO2: 26 mmol/L (ref 22–32)
Calcium: 9 mg/dL (ref 8.9–10.3)
Creatinine, Ser: 1.97 mg/dL — ABNORMAL HIGH (ref 0.61–1.24)
GFR calc non Af Amer: 29 mL/min — ABNORMAL LOW (ref >60)
GFR, EST AFRICAN AMERICAN: 34 mL/min — AB (ref >60)
Glucose, Bld: 125 mg/dL — ABNORMAL HIGH (ref 65–99)
POTASSIUM: 3.8 mmol/L (ref 3.5–5.1)
SODIUM: 139 mmol/L (ref 135–145)
Total Bilirubin: 0.8 mg/dL (ref 0.3–1.2)
Total Protein: 6.9 g/dL (ref 6.5–8.1)

## 2016-12-18 MED ORDER — HYDROCODONE-ACETAMINOPHEN 5-325 MG PO TABS: 1.0000 | ORAL_TABLET | Freq: Four times a day (QID) | ORAL | 0 refills | Status: DC | PRN

## 2016-12-18 MED ORDER — SULFAMETHOXAZOLE-TRIMETHOPRIM 800-160 MG PO TABS
1.0000 | ORAL_TABLET | Freq: Once | ORAL | Status: AC
  Administered 2016-12-18: 1 via ORAL
  Filled 2016-12-18: qty 1

## 2016-12-18 MED ORDER — FENTANYL CITRATE (PF) 100 MCG/2ML IJ SOLN
50.0000 ug | Freq: Once | INTRAMUSCULAR | Status: AC
  Administered 2016-12-18: 50 ug via INTRAVENOUS
  Filled 2016-12-18: qty 2

## 2016-12-18 MED ORDER — CLINDAMYCIN HCL 300 MG PO CAPS: 300.0000 mg | ORAL_CAPSULE | Freq: Four times a day (QID) | ORAL | 0 refills | Status: DC

## 2016-12-18 NOTE — Progress Notes (Signed)
Will order MRI at [facility] to f/u pancreatic cysts.  Pt should still f/u in the office if his pelvic pain persists and his cellulitis does not resolve.   Wrenly Lauritsen L. Lorelie Biermann, D.O. [facility] [address], [place] [address] Cell Phone (Mon-Fri 8am-5pm):  [phone] On Call:  [phone] & follow prompts after 5pm & weekends Office Phone:  [phone] Office Fax:  [phone]

## 2016-12-18 NOTE — ED Triage Notes (Addendum)
Reports of rash to buttocks x10 days. Patient has increased pain when touching rash of sitting down per patient.

## 2016-12-18 NOTE — ED Provider Notes (Signed)
Emergency Department Provider Note   I have reviewed the triage vital signs and the nursing notes.   HISTORY  Chief Complaint Rash   HPI Wesley Garcia is a 83 y.o. male history of chronic kidney disease, diabetes and diverticulosis presents to the emergency department today with erythema and pain to his sacral area that seems to be progressively worsening. Patient states this been there for about a week and a half he's been trying Neosporin ointment without any relief. No pain with defecation only pain with sitting. No drainage or swelling that he's noticed there. No history of same. No fevers. No change in bowel movements or urination. No other associated modifying symptoms. No history of the same.   Past Medical History:  Diagnosis Date  . Chronic kidney disease (CKD), stage III (moderate) (HCC)   . Diabetes mellitus   . Diverticulosis   . Hard of hearing   . Hemorrhoids   . Hx of adenomatous colonic polyps 2006  . Hypertension   . 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 Chronic kidney disease, stage 4 (severe) ([place]) 08/14/2016  . Exudative age-related macular degeneration of left eye ([place]) 08/14/2016  . Tachy-brady syndrome ([place]) 08/14/2016  . Primary osteoarthritis of right knee 08/14/2016  . Benign essential hypertension 08/14/2016  . RUQ pain 04/17/2014  . DM (diabetes mellitus) type II controlled with renal manifestation ([place]) 04/17/2014  . Acute cholecystitis 04/17/2014  . Gallstones   . Hyperlipidemia associated with type 2 diabetes mellitus ([place]) 06/10/2012  . Hypertensive kidney disease, benign(403.1) 06/10/2012  . Personal history of colonic adenomas 09/29/2004    Past Surgical History:  Procedure Laterality Date  . CHOLECYSTECTOMY N/A 04/17/2014   Procedure: LAPAROSCOPIC CHOLECYSTECTOMY WITH INTRAOPERATIVE CHOLANGIOGRAM;  Surgeon: Erroll Luna, MD;  Location: [place];  Service: General;   Laterality: N/A;  . COLONOSCOPY  11/02/2007  . MAXILLECTOMY Right 2003   SCCA alveolar ridge    Current Outpatient Rx  . Order #: [id number] Class: Historical Med  . Order #: [id number] Class: Fax  . Order #: [id number] Class: Normal  . Order #: [id number] Class: Normal  . Order #: [id number] Class: Print  . Order #: [id number] Class: Print    Allergies Patient has no known allergies.  Family History  Problem Relation Age of Onset  . Cancer Mother        ?  Marland Kitchen Bone cancer Sister   . Diabetes Brother   . Kidney disease Sister   . Colon cancer Neg Hx     Social History Social History  Substance Use Topics  . Smoking status: Former Smoker    Types: Cigars    Quit date: 04/07/2000  . Smokeless tobacco: Never Used  . Alcohol use Yes     Comment: socially    Review of Systems  All other systems negative except as documented in the HPI. All pertinent positives and negatives as reviewed in the HPI. ____________________________________________   PHYSICAL EXAM:  VITAL SIGNS: ED Triage Vitals [12/18/16 0956]  Enc Vitals Group     BP (!) 194/90     Pulse Rate 73     Resp 18     Temp 97.6 F (36.4 C)     Temp Source Oral     SpO2 100 %     Weight 151 lb (68.5 kg)     Height 5\' 8"  (1.727 m)     Head Circumference  Peak Flow      Pain Score 9     Pain Loc      Pain Edu?      Excl. in [place]?     Constitutional: Alert and oriented. Well appearing and in no acute distress. Eyes: Conjunctivae are normal. PERRL. EOMI. Head: Atraumatic. Nose: No congestion/rhinnorhea. Mouth/Throat: Mucous membranes are moist.  Oropharynx non-erythematous. Neck: No stridor.  No meningeal signs.   Cardiovascular: Normal rate, regular rhythm. Good peripheral circulation. Grossly normal heart sounds.   Respiratory: Normal respiratory effort.  No retractions. Lungs CTAB. Gastrointestinal: Soft and nontender. No distention.  Musculoskeletal: No lower extremity tenderness nor edema. No gross  deformities of extremities. Neurologic:  Normal speech and language. No gross focal neurologic deficits are appreciated.  Skin:  Skin is warm, dry and intact. Erythema and ttp to around coccyx area. Mild induration and edema. No fluctuance. Has an open wound appears to be an ulcer but no drainage and not deep.   ____________________________________________   LABS (all labs ordered are listed, but only abnormal results are displayed)  Labs Reviewed  CBC WITH DIFFERENTIAL/PLATELET - Abnormal; Notable for the following:       Result Value   Monocytes Absolute 1.1 (*)    All other components within normal limits  COMPREHENSIVE METABOLIC PANEL - Abnormal; Notable for the following:    Glucose, Bld 125 (*)    BUN 26 (*)    Creatinine, Ser 1.97 (*)    AST 14 (*)    ALT 11 (*)    GFR calc non Af Amer 29 (*)    GFR calc Af Amer 34 (*)    All other components within normal limits  I-STAT CHEM 8, ED - Abnormal; Notable for the following:    BUN 28 (*)    Creatinine, Ser 1.90 (*)    Glucose, Bld 123 (*)    All other components within normal limits   ____________________________________________  EKG   ____________________________________________  RADIOLOGY  Ct Renal Stone Study  Result Date: 12/18/2016 CLINICAL DATA:  Pelvic pain for several days EXAM: CT ABDOMEN AND PELVIS WITHOUT CONTRAST TECHNIQUE: Multidetector CT imaging of the abdomen and pelvis was performed following the standard protocol without IV contrast. COMPARISON:  08/04/2011 FINDINGS: Lower chest: Some calcified pleural plaquing is noted on the left posteriorly. Associated scarring is noted. Minimal emphysematous change is seen. A moderate-sized hiatal hernia is noted. Hepatobiliary: No focal liver abnormality is seen. Status post cholecystectomy. No biliary dilatation. Pancreas: Pancreas is predominately fatty infiltrated. There is some fullness identified in the tail of the pancreas best seen on image number 24 of series  2 which was not as well appreciated on the prior exam. This measures approximately 1.9 x 1.2 cm. A second smaller lesion is identified as well on the same image. These have relative decreased attenuation and may represent small cystic lesions. Spleen: Normal in size without focal abnormality. Adrenals/Urinary Tract: The adrenal glands are within normal limits. There are cystic lesions identified within the kidneys bilaterally stable from previous exams. No calculi or obstructive changes are noted. The bladder is partially distended. Stomach/Bowel: Diverticulosis of the colon is noted without evidence of diverticulitis. A small loop of sigmoid colon is noted within a left inguinal hernia although no obstructive changes are noted. The appendix is within normal limits. No inflammatory changes are seen. Vascular/Lymphatic: Aortoiliac calcifications are noted without aneurysmal dilatation. No significant lymphadenopathy is noted. Reproductive: Prostate is mildly prominent although no focal mass is seen. Seminal  vesicles are within normal limits. Other: No free fluid is noted. Musculoskeletal: Degenerative changes of the lumbar spine are seen. No compression deformities are noted. IMPRESSION: Cystic appearing lesions in the tail of the pancreas new from the prior exam. Nonemergent MRI (to allow for optimum imaging) is recommended for further characterization. Chronic appearing pleural plaquing in the left lung posteriorly. This is stable from the prior exam. Left inguinal hernia with a loop of sigmoid colon within although no obstructive changes are noted. Hiatal hernia. Diverticulosis without diverticulitis. Chronic renal cystic change. Electronically Signed   By: Inez Catalina M.D.   On: 12/18/2016 13:23    ____________________________________________   PROCEDURES  Procedure(s) performed:   Procedures   ____________________________________________   INITIAL IMPRESSION / ASSESSMENT AND PLAN / ED  COURSE  Pertinent labs & imaging results that were available during my care of the patient were reviewed by me and considered in my medical decision making (see chart for details).  CT negative for any deep abscess or infection. Likely consistent with cellulitis. Question possible decubitus ulcer however it seems to begin worse even without pressures think this is maybe less likely. We'll use antibiotics for this is a likely cellulitis with close PCP follow-up. Patient also found to have incidental necrotic mass which she needed an MRI him and his wife are aware and at the discharge papers that his primary doctor needs to do this. ____________________________________________  FINAL CLINICAL IMPRESSION(S) / ED DIAGNOSES  Final diagnoses:  Cellulitis of buttock  Pancreas, cyst, true     MEDICATIONS GIVEN DURING THIS VISIT:  Medications  sulfamethoxazole-trimethoprim (BACTRIM DS,SEPTRA DS) 800-160 MG per tablet 1 tablet (1 tablet Oral Given 12/18/16 1208)  fentaNYL (SUBLIMAZE) injection 50 mcg (50 mcg Intravenous Given 12/18/16 1208)     NEW OUTPATIENT MEDICATIONS STARTED DURING THIS VISIT:  Discharge Medication List as of 12/18/2016  2:20 PM    START taking these medications   Details  clindamycin (CLEOCIN) 300 MG capsule Take 1 capsule (300 mg total) by mouth 4 (four) times daily. X 7 days, Starting Mon 12/18/2016, Print    HYDROcodone-acetaminophen (NORCO) 5-325 MG tablet Take 1 tablet by mouth every 6 (six) hours as needed for severe pain., Starting Mon 12/18/2016, Print        Note:  This document was prepared using Dragon voice recognition software and may include unintentional dictation errors.   Merrily Pew, MD 12/18/16 [phone]

## 2016-12-26 ENCOUNTER — Encounter: Payer: Self-pay | Admitting: Nurse Practitioner

## 2016-12-26 ENCOUNTER — Ambulatory Visit (INDEPENDENT_AMBULATORY_CARE_PROVIDER_SITE_OTHER): Payer: Medicare HMO | Admitting: Nurse Practitioner

## 2016-12-26 VITALS — BP 162/84 | HR 60 | Temp 97.8°F | Resp 17 | Ht 66.0 in | Wt 150.6 lb

## 2016-12-26 DIAGNOSIS — Z794 Long term (current) use of insulin: Secondary | ICD-10-CM | POA: Diagnosis not present

## 2016-12-26 DIAGNOSIS — E1122 Type 2 diabetes mellitus with diabetic chronic kidney disease: Secondary | ICD-10-CM | POA: Diagnosis not present

## 2016-12-26 DIAGNOSIS — I10 Essential (primary) hypertension: Secondary | ICD-10-CM

## 2016-12-26 DIAGNOSIS — L89301 Pressure ulcer of unspecified buttock, stage 1: Secondary | ICD-10-CM

## 2016-12-26 DIAGNOSIS — N184 Chronic kidney disease, stage 4 (severe): Secondary | ICD-10-CM

## 2016-12-26 DIAGNOSIS — L03317 Cellulitis of buttock: Secondary | ICD-10-CM

## 2016-12-26 DIAGNOSIS — R197 Diarrhea, unspecified: Secondary | ICD-10-CM | POA: Diagnosis not present

## 2016-12-26 LAB — CBC WITH DIFFERENTIAL/PLATELET
Basophils Absolute: 71 cells/uL (ref 0–200)
Basophils Relative: 0.8 %
EOS PCT: 1.1 %
Eosinophils Absolute: 98 cells/uL (ref 15–500)
HEMATOCRIT: 42.4 % (ref 38.5–50.0)
Hemoglobin: 14.2 g/dL (ref 13.2–17.1)
LYMPHS ABS: 1317 {cells}/uL (ref 850–3900)
MCH: 30.5 pg (ref 27.0–33.0)
MCHC: 33.5 g/dL (ref 32.0–36.0)
MCV: 91.2 fL (ref 80.0–100.0)
MONOS PCT: 11.6 %
MPV: 10.1 fL (ref 7.5–12.5)
NEUTROS PCT: 71.7 %
Neutro Abs: 6381 cells/uL (ref 1500–7800)
PLATELETS: 195 10*3/uL (ref 140–400)
RBC: 4.65 10*6/uL (ref 4.20–5.80)
RDW: 13 % (ref 11.0–15.0)
Total Lymphocyte: 14.8 %
WBC mixed population: 1032 cells/uL — ABNORMAL HIGH (ref 200–950)
WBC: 8.9 10*3/uL (ref 3.8–10.8)

## 2016-12-26 MED ORDER — AMLODIPINE BESYLATE 2.5 MG PO TABS: 2.5000 mg | ORAL_TABLET | Freq: Every day | ORAL | 3 refills | Status: DC

## 2016-12-26 NOTE — Progress Notes (Signed)
Careteam: Patient Care Team: Gayland Curry, DO as PCP - General (Geriatric Medicine)  Advanced Directive information Does Patient Have a Medical Advance Directive?: Yes, Type of Advance Directive: [place];Living will  No Known Allergies  Chief Complaint  Patient presents with  . Acute Visit    Pt is being seen for cellulitis of buttock. Pt has stopped taking his tradjenta due to possible pancreas side effects listed on medication leaflet.   . Other    Wife in room.      HPI: Patient is a 83 y.o. male seen in the office today for follow up on cellulitis and cystic appearing lesion in the pail of the pancreas.  MRI scheduled Monday morning.   Bottom looking better per pt. Took all of antibiotics except for 2 pills. Had loose stool and noticed a little bit of blood in his stool. Looked worse when he flushed the toilet.  No nausea or vomiting. No BM today.  Sacrum is hurting. Taking bufferin as needed Got a cushion which helps.   Did not take tradjenta because on the insert said it could cause inflammation and worried this was his problem.   Blood pressure has been elevated. Home bp range 127-181/55-91; mostly ranging greater than 140/80; used to require blood pressure medication.  Review of Systems:  Review of Systems  Constitutional: Negative for fever and weight loss.  HENT: Positive for hearing loss. Negative for congestion.   Eyes: Positive for blurred vision. Negative for double vision.  Respiratory: Negative for shortness of breath and wheezing.   Cardiovascular: Negative for chest pain, palpitations and leg swelling.  Gastrointestinal: Positive for diarrhea. Negative for abdominal pain, constipation and nausea.  Genitourinary: Negative for dysuria.  Skin:       Tenderness to sacrum   Neurological: Negative for dizziness, loss of consciousness and weakness.  Endo/Heme/Allergies: Bruises/bleeds easily.  Psychiatric/Behavioral: Positive for  memory loss. Negative for depression and hallucinations. The patient does not have insomnia.     Past Medical History:  Diagnosis Date  . Chronic kidney disease (CKD), stage III (moderate) (HCC)   . Diabetes mellitus   . Diverticulosis   . Hard of hearing   . Hemorrhoids   . Hx of adenomatous colonic polyps 2006  . Hypertension   . Squamous cell carcinoma of hard palate ([place]) 2003   Past Surgical History:  Procedure Laterality Date  . CHOLECYSTECTOMY N/A 04/17/2014   Procedure: LAPAROSCOPIC CHOLECYSTECTOMY WITH INTRAOPERATIVE CHOLANGIOGRAM;  Surgeon: Erroll Luna, MD;  Location: [place];  Service: General;  Laterality: N/A;  . COLONOSCOPY  11/02/2007  . MAXILLECTOMY Right 2003   SCCA alveolar ridge   Social History:   reports that he quit smoking about 16 years ago. His smoking use included Cigars. He has never used smokeless tobacco. He reports that he drinks alcohol. He reports that he does not use drugs.  Family History  Problem Relation Age of Onset  . Cancer Mother        ?  Marland Kitchen Bone cancer Sister   . Diabetes Brother   . Kidney disease Sister   . Colon cancer Neg Hx     Medications: Patient's Medications  New Prescriptions   No medications on file  Previous Medications   EASY COMFORT PEN NEEDLES 31G X 5 MM MISC    use as directed TO INJECT INSULIN TO CONTROL BLOOD SUGAR   GLUCOSE BLOOD TEST STRIP    One Touch Verio test strip used to test  blood sugar three times daily. Dx: E11.22   INSULIN DEGLUDEC (TRESIBA FLEXTOUCH) 200 UNIT/ML SOPN    Inject 12 Units into the skin daily.   TRADJENTA 5 MG TABS TABLET    TAKE 1 TABLET BY MOUTH EVERY DAY  Modified Medications   No medications on file  Discontinued Medications   CLINDAMYCIN (CLEOCIN) 300 MG CAPSULE    Take 1 capsule (300 mg total) by mouth 4 (four) times daily. X 7 days   HYDROCODONE-ACETAMINOPHEN (NORCO) 5-325 MG TABLET    Take 1 tablet by mouth every 6 (six) hours as needed for severe pain.     Physical  Exam:  Vitals:   12/26/16 0938  BP: (!) 162/84  Pulse: 60  Resp: 17  Temp: 97.8 F (36.6 C)  TempSrc: Oral  SpO2: 99%  Weight: 150 lb 9.6 oz (68.3 kg)  Height: 5\' 6"  (1.676 m)   Body mass index is 24.31 kg/m.  Physical Exam  Constitutional: He is oriented to person, place, and time. He appears well-developed and well-nourished. No distress.  HENT:  Head: Normocephalic and atraumatic.  Mouth/Throat: Oropharynx is clear and moist. No oropharyngeal exudate.  Eyes: Pupils are equal, round, and reactive to light. Conjunctivae and EOM are normal.  Neck: Normal range of motion. Neck supple.  Cardiovascular: Normal rate, regular rhythm and normal heart sounds.   Pulmonary/Chest: Effort normal and breath sounds normal.  Abdominal: Soft. Bowel sounds are normal.  Genitourinary: Rectal exam shows guaiac negative stool.  Musculoskeletal: He exhibits no edema or tenderness.  Neurological: He is alert and oriented to person, place, and time.  Skin: Skin is warm and dry. He is not diaphoretic.  Stage 1 sacral ulcer   Psychiatric: He has a normal mood and affect. Judgment normal.    Labs reviewed: Basic Metabolic Panel:  Recent Labs  05/15/16 1124 12/18/16 1207 12/18/16 1216  NA 141 139 142  K 4.2 3.8 4.1  CL 106 105 106  CO2 25 26  --   GLUCOSE 187* 125* 123*  BUN 24 26* 28*  CREATININE 2.04* 1.97* 1.90*  CALCIUM 9.1 9.0  --    Liver Function Tests:  Recent Labs  12/18/16 1207  AST 14*  ALT 11*  ALKPHOS 64  BILITOT 0.8  PROT 6.9  ALBUMIN 3.8   No results for input(s): LIPASE, AMYLASE in the last 8760 hours. No results for input(s): AMMONIA in the last 8760 hours. CBC:  Recent Labs  05/15/16 1124 12/18/16 1207 12/18/16 1216  WBC 8.9 9.8  --   NEUTROABS 5,785 7.0  --   HGB 13.5 13.7 13.9  HCT 40.3 41.6 41.0  MCV 92.9 94.8  --   PLT 213 176  --    Lipid Panel:  Recent Labs  12/07/16 0816  CHOL 83  HDL 62  TRIG 31  CHOLHDL 1.3   TSH: No results  for input(s): TSH in the last 8760 hours. A1C: Lab Results  Component Value Date   HGBA1C 6.6 (H) 12/07/2016     Assessment/Plan 1. Benign essential hypertension -elevated on review of home bps, encouraged dietary modifications and will start low dose amlodipine, appears he has been on this in the past.  - amLODipine (NORVASC) 2.5 MG tablet; Take 1 tablet (2.5 mg total) by mouth daily.  Dispense: 90 tablet; Refill: 3  2. Controlled type 2 diabetes mellitus with stage 4 chronic kidney disease, with long-term current use of insulin (HCC) -may use tradjenta and to cont tresiba 12 units daily.  3. Cellulitis of buttock Improve, to complete course of cleocin - CBC with Differential/Platelets  4. Pressure injury of buttock, stage 1, unspecified laterality -duoderm applied and to change weekly or to reapply if soiled -encouraged pressure reduction and to increase protein in diet.   5. Diarrhea, unspecified type -most likely due to antibiotic use, to use probiotic twice daily.  - CBC with Differential/Platelets to evaluate for elevation in WBC  Next appt: 2 weeks to follow up pressure ulcer Jessica K. Harle Battiest  [facility] [phone] 8 am - 5 pm) [phone] (after hours)

## 2016-12-26 NOTE — Patient Instructions (Addendum)
Start amlodipine 2.5 mg by mouth daily for high blood pressure.   To use probiotic twice daily for 1 week   Cont tradjenta for diabetes  Increase protein intake- use GLUCERNA daily after lunch To keep duoderm on for a week (dressing to buttock) can change if it comes off or gets soiled. To use until area healed.   To avoid NSAIDS to use tylenol 325 mg 1-2 tablets every 6 hours as needed for pain.    Pressure Injury A pressure injury, sometimes called a bedsore, is an injury to the skin and underlying tissue caused by pressure. Pressure on blood vessels causes decreased blood flow to the skin, which can eventually cause the skin tissue to die and break down into a wound. Pressure injuries usually occur:  Over bony parts of the body such as the tailbone, shoulders, elbows, hips, and heels.  Under medical devices such as respiratory equipment, stockings, tubes, and splints.  Pressure injuries start as reddened areas on the skin and can lead to pain, muscle damage, and infection. Pressure injuries can vary in severity. What are the causes? Pressure injuries are caused by a lack of blood supply to an area of skin. They can occur from intense pressure over a short period of time or from less intense pressure over a long period of time. What increases the risk? This condition is more likely to develop in people who:  Are in the hospital or an extended care facility.  Are bedridden or in a wheelchair.  Have an injury or disease that keeps them from: ? Moving normally. ? Feeling pain or pressure.  Have a condition that: ? Makes them sleepy or less alert. ? Causes poor blood flow.  Need to wear a medical device.  Have poor control of their bladder or bowel functions (incontinence).  Have poor nutrition (malnutrition).  Are of certain ethnicities. People of African American and Latino or Hispanic descent are at higher risk compared to other ethnic groups.  If you are at risk for  pressure ulcers, your health care provider may recommend certain types of bedding to help prevent them. These may include foam or gel mattresses covered with one of the following:  A sheepskin blanket.  A pad that is filled with gel, air, water, or foam.  What are the signs or symptoms? The main symptom is a blister or change in skin color that opens into a wound. Other symptoms include:  Red or dark areas of skin that do not turn white or pale when pressed with a finger.  Pain, warmth, or change of skin texture.  How is this diagnosed? This condition is diagnosed with a medical history and physical exam. You may also have tests, including:  Blood tests to check for infection or signs of poor nutrition.  Imaging studies to check for damage to the deep tissues under your skin.  Blood flow studies.  Your pressure injury will be staged to determine its severity. Staging is an assessment of:  The depth of the pressure injury.  Which tissues are exposed because of the pressure injury.  The causes of the pressure injury.  How is this treated? The main focus of treatment is to help your injury heal. This may be done by:  Relieving or redistributing pressure on your skin. This includes: ? Frequently changing your position. ? Eliminating or minimizing positions that caused the wound or that can make the wound worse. ? Using specific bed mattresses and chair cushions. ? Refitting,  resizing, or replacing any medical devices, or padding the skin under them. ? Using creams or powders to prevent rubbing (friction) on the skin.  Keeping your skin clean and dry. This may include using a skin cleanser or skin protectant as told by your health care provider. This may be a lotion, ointment, or spray.  Cleaning your injury and removing any dead tissue from the wound (debridement).  Placing a bandage (dressing) over your injury.  Preventing or treating infection. This may include antibiotic,  antimicrobial, or antiseptic medicines.  Treatment may also include medicine for pain. Sometimes surgery is needed to close the wound with a flap of healthy skin or a piece of skin from another area of your body (graft). You may need surgery if other treatments are not working or if your injury is very deep. Follow these instructions at home: Wound care  Follow instructions from your health care provider about: ? How to take care of your wound. ? When and how you should change your dressing. ? When you should remove your dressing. If your dressing is dry and stuck when you try to remove it, moisten or wet the dressing with saline or water so that it can be removed without harming your skin or wound tissue.  Check your wound every day for signs of infection. Have a caregiver do this for you if you are not able. Watch for: ? More redness, swelling, or pain. ? More fluid, blood, or pus. ? A bad smell. Skin Care  Keep your skin clean and dry. Gently pat your skin dry.  Do not rub or massage your skin.  Use a skin protectant only as told by your health care provider.  Check your skin every day for any changes in color or any new blisters or sores (ulcers). Have a caregiver do this for you if you are not able. Medicines  Take over-the-counter and prescription medicines only as told by your health care provider.  If you were prescribed an antibiotic medicine, take it or apply it as told by your health care provider. Do not stop taking or using the antibiotic even if your condition improves. Reducing and Redistributing Pressure  Do not lie or sit in one position for a long time. Move or change position every two hours or as told by your health care provider.  Use pillows or cushions to reduce pressure. Ask your health care provider to recommend cushions or pads for you.  Use medical devices that do not rub your skin. Tell your health care provider if one of your medical devices is causing  a pressure injury to develop. General instructions   Eat a healthy diet that includes lots of protein. Ask your health care provider for diet advice.  Drink enough fluid to keep your urine clear or pale yellow.  Be as active as you can every day. Ask your health care provider to suggest safe exercises or activities.  Do not abuse drugs or alcohol.  Keep all follow-up visits as told by your health care provider. This is important.  Do not smoke. Contact a health care provider if:   You have chills or fever.  Your pain medicine is not helping.  You have any changes in skin color.  You have new blisters or sores.  You develop warmth, redness, or swelling near a pressure injury.  You have a bad odor or pus coming from your pressure injury.  You lose control of your bowels or bladder.  You  develop new symptoms.  Your wound does not improve after 1-2 weeks of treatment.  You develop a new medical condition, such as diabetes, peripheral vascular disease, or conditions that affect your defense (immune) system. This information is not intended to replace advice given to you by your health care provider. Make sure you discuss any questions you have with your health care provider. Document Released: 02/20/2005 Document Revised: 07/26/2015 Document Reviewed: 07/01/2014 Elsevier Interactive Patient Education  [employer].

## 2016-12-28 ENCOUNTER — Ambulatory Visit: Payer: Medicare HMO | Admitting: Nurse Practitioner

## 2017-01-01 ENCOUNTER — Ambulatory Visit (HOSPITAL_COMMUNITY)
Admission: RE | Admit: 2017-01-01 | Discharge: 2017-01-01 | Disposition: A | Discharge status: Home / Self Care | Payer: Medicare HMO | Source: Ambulatory Visit | Attending: Internal Medicine | Admitting: Internal Medicine

## 2017-01-01 ENCOUNTER — Other Ambulatory Visit: Payer: Self-pay | Admitting: Internal Medicine

## 2017-01-01 ENCOUNTER — Encounter: Payer: Self-pay | Admitting: Internal Medicine

## 2017-01-01 DIAGNOSIS — K862 Cyst of pancreas: Secondary | ICD-10-CM | POA: Diagnosis present

## 2017-01-01 DIAGNOSIS — K8689 Other specified diseases of pancreas: Secondary | ICD-10-CM | POA: Insufficient documentation

## 2017-01-01 DIAGNOSIS — K449 Diaphragmatic hernia without obstruction or gangrene: Secondary | ICD-10-CM | POA: Insufficient documentation

## 2017-01-01 DIAGNOSIS — N281 Cyst of kidney, acquired: Secondary | ICD-10-CM | POA: Diagnosis not present

## 2017-01-01 DIAGNOSIS — M47895 Other spondylosis, thoracolumbar region: Secondary | ICD-10-CM | POA: Insufficient documentation

## 2017-01-01 DIAGNOSIS — I700 Atherosclerosis of aorta: Secondary | ICD-10-CM | POA: Insufficient documentation

## 2017-01-01 DIAGNOSIS — Q453 Other congenital malformations of pancreas and pancreatic duct: Secondary | ICD-10-CM | POA: Insufficient documentation

## 2017-01-01 DIAGNOSIS — M5135 Other intervertebral disc degeneration, thoracolumbar region: Secondary | ICD-10-CM | POA: Diagnosis not present

## 2017-01-01 DIAGNOSIS — K5730 Diverticulosis of large intestine without perforation or abscess without bleeding: Secondary | ICD-10-CM | POA: Diagnosis not present

## 2017-01-16 DIAGNOSIS — N183 Chronic kidney disease, stage 3 (moderate): Secondary | ICD-10-CM | POA: Diagnosis not present

## 2017-01-16 DIAGNOSIS — N2581 Secondary hyperparathyroidism of renal origin: Secondary | ICD-10-CM | POA: Diagnosis not present

## 2017-01-16 DIAGNOSIS — E1122 Type 2 diabetes mellitus with diabetic chronic kidney disease: Secondary | ICD-10-CM | POA: Diagnosis not present

## 2017-01-27 DIAGNOSIS — R69 Illness, unspecified: Secondary | ICD-10-CM | POA: Diagnosis not present

## 2017-01-30 ENCOUNTER — Telehealth: Payer: Self-pay | Admitting: *Deleted

## 2017-01-30 DIAGNOSIS — N184 Chronic kidney disease, stage 4 (severe): Secondary | ICD-10-CM

## 2017-01-30 NOTE — Telephone Encounter (Signed)
Rhonda with [facility] called and stated that patient was seen on 11/13 and they were not aware of patient's insurance had changed to [facility] [employer]. Stated that they need a Referral Authorization from the PCP for his visit and future visits to be covered. Needs referral placed and Authorization for 11/13 and future appointments. Please Advise.

## 2017-01-31 NOTE — Telephone Encounter (Signed)
Referral placed.

## 2017-03-07 ENCOUNTER — Other Ambulatory Visit: Payer: Self-pay | Admitting: *Deleted

## 2017-03-07 DIAGNOSIS — I10 Essential (primary) hypertension: Secondary | ICD-10-CM

## 2017-03-07 MED ORDER — LINAGLIPTIN 5 MG PO TABS: 5.0000 mg | ORAL_TABLET | Freq: Every day | ORAL | 3 refills | Status: DC

## 2017-03-07 MED ORDER — INSULIN DEGLUDEC 200 UNIT/ML ~~LOC~~ SOPN: 12.0000 [IU] | PEN_INJECTOR | Freq: Every day | SUBCUTANEOUS | 3 refills | Status: DC

## 2017-03-07 MED ORDER — AMLODIPINE BESYLATE 2.5 MG PO TABS: 2.5000 mg | ORAL_TABLET | Freq: Every day | ORAL | 3 refills | Status: DC

## 2017-03-07 NOTE — Telephone Encounter (Signed)
Patient requested to be sent to [facility].

## 2017-03-29 ENCOUNTER — Other Ambulatory Visit: Payer: Self-pay | Admitting: *Deleted

## 2017-03-29 MED ORDER — GLUCOSE BLOOD VI STRP: ORAL_STRIP | 3 refills | Status: DC

## 2017-03-29 MED ORDER — ACCU-CHEK AVIVA DEVI: 0 refills | Status: DC

## 2017-03-29 NOTE — Telephone Encounter (Signed)
[facility] [address] [place]

## 2017-04-02 ENCOUNTER — Other Ambulatory Visit: Payer: Self-pay | Admitting: *Deleted

## 2017-04-02 MED ORDER — ACCU-CHEK AVIVA DEVI: 0 refills | Status: DC

## 2017-04-02 MED ORDER — GLUCOSE BLOOD VI STRP: ORAL_STRIP | 3 refills | Status: DC

## 2017-04-02 NOTE — Telephone Encounter (Signed)
[facility] [place]

## 2017-04-16 ENCOUNTER — Ambulatory Visit (INDEPENDENT_AMBULATORY_CARE_PROVIDER_SITE_OTHER): Payer: Medicare PPO | Admitting: Internal Medicine

## 2017-04-16 ENCOUNTER — Encounter: Payer: Self-pay | Admitting: Internal Medicine

## 2017-04-16 VITALS — BP 140/80 | HR 64 | Temp 97.4°F | Ht 66.0 in | Wt 153.0 lb

## 2017-04-16 DIAGNOSIS — Z7189 Other specified counseling: Secondary | ICD-10-CM

## 2017-04-16 DIAGNOSIS — Z794 Long term (current) use of insulin: Secondary | ICD-10-CM | POA: Diagnosis not present

## 2017-04-16 DIAGNOSIS — L853 Xerosis cutis: Secondary | ICD-10-CM

## 2017-04-16 DIAGNOSIS — F320 Major depressive disorder, single episode, mild: Secondary | ICD-10-CM

## 2017-04-16 DIAGNOSIS — I10 Essential (primary) hypertension: Secondary | ICD-10-CM

## 2017-04-16 DIAGNOSIS — E1169 Type 2 diabetes mellitus with other specified complication: Secondary | ICD-10-CM

## 2017-04-16 DIAGNOSIS — E785 Hyperlipidemia, unspecified: Secondary | ICD-10-CM

## 2017-04-16 DIAGNOSIS — N184 Chronic kidney disease, stage 4 (severe): Secondary | ICD-10-CM | POA: Diagnosis not present

## 2017-04-16 DIAGNOSIS — E1122 Type 2 diabetes mellitus with diabetic chronic kidney disease: Secondary | ICD-10-CM | POA: Diagnosis not present

## 2017-04-16 MED ORDER — SERTRALINE HCL 25 MG PO TABS: 25.0000 mg | ORAL_TABLET | Freq: Every day | ORAL | 3 refills | Status: DC

## 2017-04-16 NOTE — Progress Notes (Signed)
Location:  [facility] clinic Provider:  Thaily Hackworth L. Mariea Clonts, D.O., C.M.D.  Code Status: DNR as discussed today Goals of Care:  Advanced Directives 04/16/2017  Does Patient Have a Medical Advance Directive? No  Type of Advance Directive -  Copy of [place] in Chart? -  Would patient like information on creating a medical advance directive? No - Patient declined   Chief Complaint  Patient presents with  . Medical Management of Chronic Issues    5mth follow-up  . ACP    no ACP     HPI: Patient is a 84 y.o. male seen today for medical management of chronic diseases.    He is itching a lot.  Not sure if it's medicine or dry skin.  He was already seen by derm and given a cream that they don't know the name of (was in [place]).  Wondering if he can use that on the areas that bother him.  Sugars staying from 90s to 120s in meter.  No lows and no highs seen. They continue to go dancing but just once a week instead of twice b/c it gets dark too early and Gaynell is the only one that is driving at night.  Feels more energetic since drinking protein supplement drinking NP recommended for him.    We discussed advance care planning mostly today.  Both of them request DNR--see acp below and separate note.  Past Medical History:  Diagnosis Date  . Chronic kidney disease (CKD), stage III (moderate) (HCC)   . Diabetes mellitus   . Diverticulosis   . Hard of hearing   . Hemorrhoids   . Hx of adenomatous colonic polyps 2006  . Hypertension   . 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 CHOLECYSTECTOMY N/A 04/17/2014   Procedure: LAPAROSCOPIC CHOLECYSTECTOMY WITH INTRAOPERATIVE CHOLANGIOGRAM;  Surgeon: Erroll Luna, MD;  Location: [place];  Service: General;  Laterality: N/A;  . COLONOSCOPY  11/02/2007  . MAXILLECTOMY Right 2003   SCCA alveolar ridge    No Known Allergies  Outpatient Encounter Medications as of  04/16/2017  Medication Sig  . amLODipine (NORVASC) 2.5 MG tablet Take 1 tablet (2.5 mg total) by mouth daily.  . Blood Glucose Monitoring Suppl (ACCU-CHEK AVIVA) device Use to test blood sugar three times daily. Dx: E11.22  . EASY COMFORT PEN NEEDLES 31G X 5 MM MISC use as directed TO INJECT INSULIN TO CONTROL BLOOD SUGAR  . glucose blood (ACCU-CHEK AVIVA) test strip Use to test blood sugar three times daily. Dx:E11.22  . Insulin Degludec (TRESIBA FLEXTOUCH) 200 UNIT/ML SOPN Inject 12 Units into the skin daily.  Marland Kitchen linagliptin (TRADJENTA) 5 MG TABS tablet Take 1 tablet (5 mg total) by mouth daily.  . Nutritional Supplements (HIGH-PROTEIN NUTRITIONAL SHAKE) LIQD Take by mouth as needed.   No facility-administered encounter medications on file as of 04/16/2017.     Review of Systems:  Review of Systems  Constitutional: Negative for chills, fever and malaise/fatigue.  HENT: Positive for hearing loss.   Eyes: Positive for blurred vision.       Macular degeneration  Respiratory: Negative for cough and shortness of breath.   Cardiovascular: Negative for chest pain, palpitations and leg swelling.  Gastrointestinal: Negative for blood in stool, constipation, melena, nausea and vomiting.       Had two days of diarrhea and abd pain but resolved now  Genitourinary: Negative for dysuria.  Musculoskeletal: Negative  for falls.  Skin: Positive for itching. Negative for rash.       Dry itchy skin  Neurological: Negative for dizziness, loss of consciousness and weakness.  Endo/Heme/Allergies: Bruises/bleeds easily.  Psychiatric/Behavioral: Positive for depression. Negative for memory loss. The patient is not nervous/anxious and does not have insomnia.     Health Maintenance  Topic Date Due  . OPHTHALMOLOGY EXAM  01/16/1940  . COLONOSCOPY  01/29/2016  . HEMOGLOBIN A1C  06/07/2017  . URINE MICROALBUMIN  12/07/2017  . FOOT EXAM  12/11/2017  . TETANUS/TDAP  04/16/2021  . INFLUENZA VACCINE  Completed    . PNA vac Low Risk Adult  Completed    Physical Exam: Vitals:   04/16/17 1003  BP: 140/80  Pulse: 64  Temp: (!) 97.4 F (36.3 C)  TempSrc: Oral  Weight: 153 lb (69.4 kg)  Height: 5\' 6"  (1.676 m)   Body mass index is 24.69 kg/m. Physical Exam  Constitutional: He is oriented to person, place, and time. No distress.  Increasingly frail appearing  HENT:  Head: Normocephalic and atraumatic.  Cardiovascular: Normal rate, regular rhythm, normal heart sounds and intact distal pulses.  Pulmonary/Chest: Effort normal and breath sounds normal. No respiratory distress.  Abdominal: Bowel sounds are normal. He exhibits no distension. There is no tenderness.  Musculoskeletal: Normal range of motion.  Stooped posture  Neurological: He is alert and oriented to person, place, and time.  Skin: Skin is warm and dry. Capillary refill takes less than 2 seconds.  Dry scaly skin  Psychiatric: He has a normal mood and affect.    Labs reviewed: Basic Metabolic Panel: Recent Labs    05/15/16 1124 12/18/16 1207 12/18/16 1216  NA 141 139 142  K 4.2 3.8 4.1  CL 106 105 106  CO2 25 26  --   GLUCOSE 187* 125* 123*  BUN 24 26* 28*  CREATININE 2.04* 1.97* 1.90*  CALCIUM 9.1 9.0  --    Liver Function Tests: Recent Labs    12/18/16 1207  AST 14*  ALT 11*  ALKPHOS 64  BILITOT 0.8  PROT 6.9  ALBUMIN 3.8   No results for input(s): LIPASE, AMYLASE in the last 8760 hours. No results for input(s): AMMONIA in the last 8760 hours. CBC: Recent Labs    05/15/16 1124 12/18/16 1207 12/18/16 1216 12/26/16 1034  WBC 8.9 9.8  --  8.9  NEUTROABS 5,785 7.0  --  6,381  HGB 13.5 13.7 13.9 14.2  HCT 40.3 41.6 41.0 42.4  MCV 92.9 94.8  --  91.2  PLT 213 176  --  195   Lipid Panel: Recent Labs    12/07/16 0816  CHOL 83  HDL 62  TRIG 31  CHOLHDL 1.3   Lab Results  Component Value Date   HGBA1C 6.6 (H) 12/07/2016    Assessment/Plan 1. Chronic kidney disease, stage 4 (severe)  (HCC) -cont to drink supplement drink -hydrate with water  2. Controlled type 2 diabetes mellitus with stage 4 chronic kidney disease, with long-term current use of insulin (HCC) -cont tresiba insulin and tradjenta therapy  3. Benign essential hypertension -bp at goal with amlodipine therapy  4. Xerosis cutis -encouraged to use a thick cream like cocoa butter or shea butter cream and call with name of cream from derm he had for his right arm to see if this will work for these areas or not  5. Advance care planning - DNR confirmed with him today--he does not want to be brought back if  he has a cardiopulmonary arrest and does not want to be put on machines.  DNR form completed.     Last ACP Note 01/16/2017 to 04/16/2017    ACP (Advance Care Planning) by Gayland Curry, DO at 04/16/2017 10:00 AM    Date of Service   Author Author Type Status Note Type File Time  04/16/2017 Tawanna Cooler, DO Physician Signed ACP (Advance Care Planning) 04/16/2017        Care Alignment Note  Advanced Directives Documents (Living Will, Power of [place]) currently in the EHR:  None; requested that he and his wife bring the living will and [facility] documents for [place] to make copies.    Has the patient discussed their wishes with their family/healthcare power of attorney Yes with his wife Darliss Ridgel who was also present for appt today  What does the patient/decision maker understand about their medical condition and the natural course of their disease. She understands that he would not want to be brought back if he had an arrest or to be kept alive on machines.  What is the patient/decision maker's biggest fear or concern for the future?  She notices he's increasingly frail--no specific concerns.  What is the most important goal for this patient should their health condition worsen?  He does not want his life prolonged even now if something were to happen.  He requests DNR.    Current None were on  file.  Current code status has been reviewed/updated.  DNR status.  [place] completed.    Time spent:20 mins spent on this.       6.  Depression -he's tearful at times and more down, wife is concerned and requests medication for it  25 mins on routine visit.  20 mins on ACP.  Labs/tests ordered:  Cbc, cmp, hba1c today (did not get ahead) Next appt:  4 mos med mgt  Maralee Higuchi L. Carlisia Geno, D.O. [facility] [address], [place] [address] Cell Phone (Mon-Fri 8am-5pm):  [phone] On Call:  [phone] & follow prompts after 5pm & weekends Office Phone:  [phone] Office Fax:  [phone]

## 2017-04-16 NOTE — Patient Instructions (Addendum)
Please bring [place] copies of your living will and health care power of attorney.  Also for Wesley Garcia.   Call us back with the name of your cream the dermatologist gave you so I can tell you if you should use it for your dry skin patches.

## 2017-04-16 NOTE — ACP (Advance Care Planning) (Signed)
Care Alignment Note  Advanced Directives Documents (Living Will, Power of Attorney) currently in the EHR:  None; requested that he and his wife bring the living will and [facility] documents for [place] to make copies.    Has the patient discussed their wishes with their family/healthcare power of attorney Yes with his wife Darliss Ridgel who was also present for appt today  What does the patient/decision maker understand about their medical condition and the natural course of their disease. She understands that he would not want to be brought back if he had an arrest or to be kept alive on machines.  What is the patient/decision maker's biggest fear or concern for the future?  She notices he's increasingly frail--no specific concerns.  What is the most important goal for this patient should their health condition worsen?  He does not want his life prolonged even now if something were to happen.  He requests DNR.    Current None were on file.  Current code status has been reviewed/updated.  DNR status.  [place] completed.    Time spent:20 mins spent on this.

## 2017-04-17 LAB — CBC WITH DIFFERENTIAL/PLATELET
Basophils Absolute: 42 cells/uL (ref 0–200)
Basophils Relative: 0.5 %
Eosinophils Absolute: 160 cells/uL (ref 15–500)
Eosinophils Relative: 1.9 %
HCT: 39.6 % (ref 38.5–50.0)
Hemoglobin: 13.9 g/dL (ref 13.2–17.1)
Lymphs Abs: 1529 cells/uL (ref 850–3900)
MCH: 32 pg (ref 27.0–33.0)
MCHC: 35.1 g/dL (ref 32.0–36.0)
MCV: 91 fL (ref 80.0–100.0)
MPV: 10.2 fL (ref 7.5–12.5)
Monocytes Relative: 12.7 %
Neutro Abs: 5603 cells/uL (ref 1500–7800)
Neutrophils Relative %: 66.7 %
Platelets: 195 10*3/uL (ref 140–400)
RBC: 4.35 10*6/uL (ref 4.20–5.80)
RDW: 12.2 % (ref 11.0–15.0)
Total Lymphocyte: 18.2 %
WBC mixed population: 1067 cells/uL — ABNORMAL HIGH (ref 200–950)
WBC: 8.4 10*3/uL (ref 3.8–10.8)

## 2017-04-17 LAB — COMPLETE METABOLIC PANEL WITH GFR
AG Ratio: 1.7 (calc) (ref 1.0–2.5)
ALT: 11 U/L (ref 9–46)
AST: 15 U/L (ref 10–35)
Albumin: 4.2 g/dL (ref 3.6–5.1)
Alkaline phosphatase (APISO): 55 U/L (ref 40–115)
BUN/Creatinine Ratio: 19 (calc) (ref 6–22)
BUN: 36 mg/dL — ABNORMAL HIGH (ref 7–25)
CO2: 25 mmol/L (ref 20–32)
Calcium: 9.5 mg/dL (ref 8.6–10.3)
Chloride: 105 mmol/L (ref 98–110)
Creat: 1.93 mg/dL — ABNORMAL HIGH (ref 0.70–1.11)
GFR, Est African American: 36 mL/min/{1.73_m2} — ABNORMAL LOW (ref >=60)
GFR, Est Non African American: 31 mL/min/{1.73_m2} — ABNORMAL LOW (ref >=60)
Globulin: 2.5 g/dL (calc) (ref 1.9–3.7)
Glucose, Bld: 137 mg/dL (ref 65–139)
Potassium: 4.4 mmol/L (ref 3.5–5.3)
Sodium: 139 mmol/L (ref 135–146)
Total Bilirubin: 0.7 mg/dL (ref 0.2–1.2)
Total Protein: 6.7 g/dL (ref 6.1–8.1)

## 2017-04-17 LAB — HEMOGLOBIN A1C
Hgb A1c MFr Bld: 8 % of total Hgb — ABNORMAL HIGH (ref <5.7)
Mean Plasma Glucose: 183 (calc)
eAG (mmol/L): 10.1 (calc)

## 2017-04-20 ENCOUNTER — Encounter: Payer: Self-pay | Admitting: *Deleted

## 2017-04-24 ENCOUNTER — Telehealth: Payer: Self-pay | Admitting: Internal Medicine

## 2017-04-24 NOTE — Telephone Encounter (Signed)
I called the patient to schedule AWV-I, but pt's wife was unable to hear me. VDM (DD)

## 2017-04-30 ENCOUNTER — Encounter: Payer: Self-pay | Admitting: Nurse Practitioner

## 2017-04-30 ENCOUNTER — Ambulatory Visit (INDEPENDENT_AMBULATORY_CARE_PROVIDER_SITE_OTHER): Payer: Medicare PPO | Admitting: Nurse Practitioner

## 2017-04-30 VITALS — BP 132/84 | HR 72 | Temp 98.4°F | Ht 66.0 in | Wt 152.0 lb

## 2017-04-30 DIAGNOSIS — E1122 Type 2 diabetes mellitus with diabetic chronic kidney disease: Secondary | ICD-10-CM | POA: Diagnosis not present

## 2017-04-30 DIAGNOSIS — F419 Anxiety disorder, unspecified: Secondary | ICD-10-CM | POA: Diagnosis not present

## 2017-04-30 DIAGNOSIS — N184 Chronic kidney disease, stage 4 (severe): Secondary | ICD-10-CM

## 2017-04-30 DIAGNOSIS — Z794 Long term (current) use of insulin: Secondary | ICD-10-CM | POA: Diagnosis not present

## 2017-04-30 NOTE — Progress Notes (Signed)
Careteam: Patient Care Team: Gayland Curry, DO as PCP - General (Geriatric Medicine)  Advanced Directive information    No Known Allergies  Chief Complaint  Patient presents with  . Acute Visit    Pt is being seen due to fluctating blood sugars.   . Other    Wife in room     HPI: Patient is a 84 y.o. male seen in the office today due to fluctuating blood sugars. Fasting blood sugar in [phone] Then reports it is high after breakfast- [phone], one reading was 314 after eating and he was worried about this. Feels like blood sugar is too high.  Pt was seen on 2/11 and after lab work was reviewed Dr Mariea Clonts increased tresiba to 15 units.  No changes in diet.  No hypoglycemic episodes  Had a lot of issues with "nerve pill" zoloft- stopped taking it and things have gotten a lot better. Read a lot of side effects and "is not going take it"  Review of Systems:  Review of Systems  Constitutional: Negative for chills, fever and malaise/fatigue.  HENT: Positive for hearing loss.   Eyes: Positive for blurred vision.       Macular degeneration  Respiratory: Negative for cough and shortness of breath.   Cardiovascular: Negative for chest pain, palpitations and leg swelling.  Gastrointestinal: Negative for blood in stool, constipation, melena, nausea and vomiting.  Genitourinary: Negative for dysuria.  Neurological: Negative for dizziness, loss of consciousness and weakness.  Endo/Heme/Allergies: Bruises/bleeds easily.  Psychiatric/Behavioral: Positive for depression. Negative for memory loss. The patient is nervous/anxious. The patient does not have insomnia.     Past Medical History:  Diagnosis Date  . Chronic kidney disease (CKD), stage III (moderate) (HCC)   . Diabetes mellitus   . Diverticulosis   . Hard of hearing   . Hemorrhoids   . Hx of adenomatous colonic polyps 2006  . Hypertension   . 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 CHOLECYSTECTOMY N/A 04/17/2014   Procedure: LAPAROSCOPIC CHOLECYSTECTOMY WITH INTRAOPERATIVE CHOLANGIOGRAM;  Surgeon: Erroll Luna, MD;  Location: [place];  Service: General;  Laterality: N/A;  . COLONOSCOPY  11/02/2007  . MAXILLECTOMY Right 2003   SCCA alveolar ridge   Social History:   reports that he quit smoking about 17 years ago. His smoking use included cigars. he has never used smokeless tobacco. He reports that he drinks alcohol. He reports that he does not use drugs.  Family History  Problem Relation Age of Onset  . Cancer Mother        ?  Marland Kitchen Bone cancer Sister   . Diabetes Brother   . Kidney disease Sister   . Colon cancer Neg Hx     Medications: Patient's Medications  New Prescriptions   No medications on file  Previous Medications   AMLODIPINE (NORVASC) 2.5 MG TABLET    Take 1 tablet (2.5 mg total) by mouth daily.   BLOOD GLUCOSE MONITORING SUPPL (ACCU-CHEK AVIVA) DEVICE    Use to test blood sugar three times daily. Dx: E11.22   EASY COMFORT PEN NEEDLES 31G X 5 MM MISC    use as directed TO INJECT INSULIN TO CONTROL BLOOD SUGAR   GLUCOSE BLOOD (ACCU-CHEK AVIVA) TEST STRIP    Use to test blood sugar three times daily. Dx:E11.22   INSULIN DEGLUDEC (TRESIBA) 100 UNIT/ML SOLN    Inject 15 Units into the skin daily.  LINAGLIPTIN (TRADJENTA) 5 MG TABS TABLET    Take 1 tablet (5 mg total) by mouth daily.   NUTRITIONAL SUPPLEMENTS (HIGH-PROTEIN NUTRITIONAL SHAKE) LIQD    Take by mouth as needed.   SERTRALINE (ZOLOFT) 25 MG TABLET    Take 1 tablet (25 mg total) by mouth daily.  Modified Medications   No medications on file  Discontinued Medications   INSULIN DEGLUDEC (TRESIBA FLEXTOUCH) 200 UNIT/ML SOPN    Inject 12 Units into the skin daily.     Physical Exam:  Vitals:   04/30/17 0919  BP: 132/84  Pulse: 72  Temp: 98.4 F (36.9 C)  TempSrc: Oral  SpO2: 96%  Weight: 152 lb (68.9 kg)  Height: 5\' 6"  (1.676 m)   Body mass  index is 24.53 kg/m.  Physical Exam  Constitutional: He is oriented to person, place, and time. No distress.  frail appearing male, NAD  HENT:  Head: Normocephalic and atraumatic.  Cardiovascular: Normal rate, regular rhythm and normal heart sounds.  Pulmonary/Chest: Effort normal and breath sounds normal. No respiratory distress.  Musculoskeletal: Normal range of motion.  Stooped posture  Neurological: He is alert and oriented to person, place, and time.  Skin: Skin is warm and dry.  Dry scaly skin  Psychiatric: His mood appears anxious.    Labs reviewed: Basic Metabolic Panel: Recent Labs    05/15/16 1124 12/18/16 1207 12/18/16 1216 04/16/17 1113  NA 141 139 142 139  K 4.2 3.8 4.1 4.4  CL 106 105 106 105  CO2 25 26  --  25  GLUCOSE 187* 125* 123* 137  BUN 24 26* 28* 36*  CREATININE 2.04* 1.97* 1.90* 1.93*  CALCIUM 9.1 9.0  --  9.5   Liver Function Tests: Recent Labs    12/18/16 1207 04/16/17 1113  AST 14* 15  ALT 11* 11  ALKPHOS 64  --   BILITOT 0.8 0.7  PROT 6.9 6.7  ALBUMIN 3.8  --    No results for input(s): LIPASE, AMYLASE in the last 8760 hours. No results for input(s): AMMONIA in the last 8760 hours. CBC: Recent Labs    12/18/16 1207 12/18/16 1216 12/26/16 1034 04/16/17 1113  WBC 9.8  --  8.9 8.4  NEUTROABS 7.0  --  6,381 5,603  HGB 13.7 13.9 14.2 13.9  HCT 41.6 41.0 42.4 39.6  MCV 94.8  --  91.2 91.0  PLT 176  --  195 195   Lipid Panel: Recent Labs    12/07/16 0816  CHOL 83  HDL 62  TRIG 31  CHOLHDL 1.3   TSH: No results for input(s): TSH in the last 8760 hours. A1C: Lab Results  Component Value Date   HGBA1C 8.0 (H) 04/16/2017     Assessment/Plan 1. Controlled type 2 diabetes mellitus with stage 4 chronic kidney disease, with long-term current use of insulin (HCC) -A1c 8.0 on last labs therefore tresiba was increased from 12-15 units. Blood sugars reviewed and overall with improved glucemic control. Will cont current regimen  at this time. Nutritional education given. Offered additional follow up for diabetic diet with Tivis Ringer, pharm D but pt was not interested at this time.   2. Anxiety Does not wish to take zoloft for anxiety- read side effects and despite education states he will not take.    Next appt: 08/13/2017   Carlos American. Harle Battiest  [facility] [phone] 8 am - 5 pm) [phone] (after hours)

## 2017-04-30 NOTE — Patient Instructions (Addendum)
Continue to monitor fasting blood sugars Notify if blood sugar is staying lower consistently lower than 80 or over 200.   If you are taking your blood sugar after a meal it will be higher- could be in the 200s  Notify if it is staying over 350 with meals.

## 2017-04-30 NOTE — Telephone Encounter (Signed)
Left msg asking pt to confirm this AWV-I w/ nurse on 08/13/17 before seeing Dr. Mariea Clonts. VDM (DD)

## 2017-06-04 ENCOUNTER — Other Ambulatory Visit: Payer: Self-pay | Admitting: *Deleted

## 2017-06-04 MED ORDER — LINAGLIPTIN 5 MG PO TABS: 5.0000 mg | ORAL_TABLET | Freq: Every day | ORAL | 3 refills | Status: DC

## 2017-06-04 NOTE — Telephone Encounter (Signed)
Patient requested refill to [facility].   

## 2017-06-11 ENCOUNTER — Other Ambulatory Visit: Payer: Self-pay | Admitting: Internal Medicine

## 2017-06-11 ENCOUNTER — Other Ambulatory Visit: Payer: Self-pay | Admitting: *Deleted

## 2017-06-11 DIAGNOSIS — E1129 Type 2 diabetes mellitus with other diabetic kidney complication: Secondary | ICD-10-CM

## 2017-06-11 DIAGNOSIS — R809 Proteinuria, unspecified: Secondary | ICD-10-CM

## 2017-06-11 MED ORDER — LINAGLIPTIN 5 MG PO TABS: 5.0000 mg | ORAL_TABLET | Freq: Every day | ORAL | 0 refills | Status: DC

## 2017-06-11 MED ORDER — LINAGLIPTIN 5 MG PO TABS: 5.0000 mg | ORAL_TABLET | Freq: Every day | ORAL | 3 refills | Status: DC

## 2017-08-08 LAB — BASIC METABOLIC PANEL
BUN: 31 — AB (ref 4–21)
CREATININE: 2.2 — AB (ref 0.6–1.3)
GLUCOSE: 182
POTASSIUM: 4.7 (ref 3.4–5.3)
Sodium: 140 (ref 137–147)

## 2017-08-08 LAB — CBC AND DIFFERENTIAL: Hemoglobin: 13.9 (ref 13.5–17.5)

## 2017-08-13 ENCOUNTER — Encounter: Payer: Self-pay | Admitting: Internal Medicine

## 2017-08-13 ENCOUNTER — Ambulatory Visit (INDEPENDENT_AMBULATORY_CARE_PROVIDER_SITE_OTHER): Payer: Medicare PPO | Admitting: Internal Medicine

## 2017-08-13 ENCOUNTER — Ambulatory Visit: Payer: Medicare PPO

## 2017-08-13 VITALS — BP 150/70 | HR 67 | Temp 97.5°F | Ht 66.0 in | Wt 150.0 lb

## 2017-08-13 DIAGNOSIS — E1169 Type 2 diabetes mellitus with other specified complication: Secondary | ICD-10-CM | POA: Diagnosis not present

## 2017-08-13 DIAGNOSIS — R739 Hyperglycemia, unspecified: Secondary | ICD-10-CM | POA: Diagnosis not present

## 2017-08-13 DIAGNOSIS — I10 Essential (primary) hypertension: Secondary | ICD-10-CM

## 2017-08-13 DIAGNOSIS — W19XXXA Unspecified fall, initial encounter: Secondary | ICD-10-CM

## 2017-08-13 DIAGNOSIS — Z794 Long term (current) use of insulin: Secondary | ICD-10-CM

## 2017-08-13 DIAGNOSIS — N184 Chronic kidney disease, stage 4 (severe): Secondary | ICD-10-CM | POA: Diagnosis not present

## 2017-08-13 DIAGNOSIS — E785 Hyperlipidemia, unspecified: Secondary | ICD-10-CM | POA: Diagnosis not present

## 2017-08-13 DIAGNOSIS — E1122 Type 2 diabetes mellitus with diabetic chronic kidney disease: Secondary | ICD-10-CM | POA: Diagnosis not present

## 2017-08-13 DIAGNOSIS — Z872 Personal history of diseases of the skin and subcutaneous tissue: Secondary | ICD-10-CM | POA: Diagnosis not present

## 2017-08-13 NOTE — Progress Notes (Signed)
Location:  [facility] clinic Provider:  Dreux Mcgroarty L. Mariea Clonts, D.O., C.M.D.  Code Status: DNR Goals of Care:  Advanced Directives 08/13/2017  Does Patient Have a Medical Advance Directive? Yes  Type of Advance Directive Out of facility DNR (pink MOST or yellow form)  Does patient want to make changes to medical advance directive? No - Patient declined  Copy of [place] in Chart? -  Would patient like information on creating a medical advance directive? -  Pre-existing out of facility DNR order (yellow form or pink MOST form) Yellow form placed in chart (order not valid for inpatient use)   Chief Complaint  Patient presents with  . Medical Management of Chronic Issues    49mth follow-up    HPI: Patient is a 84 y.o. male seen today for medical management of chronic diseases.    Says he's doing fine except he has a knot at the bottom of his spine.  Dr. [name] looked at it a month or two ago and said he'd need surgery to address it.  He was to go to general surgery, but it got better after 3 weeks and now got worse.    CMA is calling Dr. Jason Nest office for the labs from 08/08/17.    He fell last week.  Straight backwards after eating a donut with chocolate icing and cream filling.  He got home and took his sugar which was 352 at home.  He did not pass out.  [place] in the mall with his wife and they'd been walking.  It was on carpet.  He was able to get up on his own.  No pain afterwards.  Says he's sworn off donuts now.  BP high today b/c they went to [employer] at 9am and sped over here from there for 10am.  They were meant to be in there only for 10 mins, but set the keys down and couldn't find them.  BP has been in the 140s anyway even with the amlodipine 2.5mg  daily.  Has been eating a lot of pork--had BLT last night.   BP decreased to 150/70.  Past Medical History:  Diagnosis Date  . Chronic kidney disease (CKD), stage III (moderate) (HCC)   . Diabetes mellitus   .  Diverticulosis   . Hard of hearing   . Hemorrhoids   . Hx of adenomatous colonic polyps 2006  . Hypertension   . 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 CHOLECYSTECTOMY N/A 04/17/2014   Procedure: LAPAROSCOPIC CHOLECYSTECTOMY WITH INTRAOPERATIVE CHOLANGIOGRAM;  Surgeon: Erroll Luna, MD;  Location: [place];  Service: General;  Laterality: N/A;  . COLONOSCOPY  11/02/2007  . MAXILLECTOMY Right 2003   SCCA alveolar ridge    No Known Allergies  Outpatient Encounter Medications as of 08/13/2017  Medication Sig  . amLODipine (NORVASC) 2.5 MG tablet Take 1 tablet (2.5 mg total) by mouth daily.  Marland Kitchen EASY COMFORT PEN NEEDLES 31G X 5 MM MISC use as directed TO INJECT INSULIN TO CONTROL BLOOD SUGAR  . glucose blood (ACCU-CHEK AVIVA) test strip Use to test blood sugar three times daily. Dx:E11.22  . Insulin Degludec (TRESIBA) 100 UNIT/ML SOLN Inject 15 Units into the skin daily.  Marland Kitchen linagliptin (TRADJENTA) 5 MG TABS tablet Take 1 tablet (5 mg total) by mouth daily.  . Nutritional Supplements (HIGH-PROTEIN NUTRITIONAL SHAKE) LIQD Take by mouth as needed.  . [DISCONTINUED] Blood Glucose Monitoring Suppl (ACCU-CHEK  AVIVA) device Use to test blood sugar three times daily. Dx: E11.22   No facility-administered encounter medications on file as of 08/13/2017.     Review of Systems:  Review of Systems  Constitutional: Negative for chills, fever and malaise/fatigue.  HENT: Positive for hearing loss. Negative for congestion.   Eyes: Negative for blurred vision.  Respiratory: Negative for cough and shortness of breath.   Cardiovascular: Negative for chest pain, palpitations and leg swelling.  Gastrointestinal: Negative for abdominal pain, blood in stool, constipation, diarrhea, heartburn, melena, nausea and vomiting.  Genitourinary: Negative for dysuria.  Musculoskeletal: Positive for back pain and falls.       Buttocks  Skin: Negative  for itching and rash.  Neurological: Positive for dizziness. Negative for loss of consciousness.  Endo/Heme/Allergies: Does not bruise/bleed easily.  Psychiatric/Behavioral: Negative for depression and memory loss. The patient is not nervous/anxious and does not have insomnia.     Health Maintenance  Topic Date Due  . OPHTHALMOLOGY EXAM  01/16/1940  . COLONOSCOPY  01/29/2016  . INFLUENZA VACCINE  10/04/2017  . HEMOGLOBIN A1C  10/14/2017  . URINE MICROALBUMIN  12/07/2017  . FOOT EXAM  12/11/2017  . TETANUS/TDAP  04/16/2021  . PNA vac Low Risk Adult  Completed    Physical Exam: Vitals:   08/13/17 1010  BP: (!) 168/80  Pulse: 67  Temp: (!) 97.5 F (36.4 C)  TempSrc: Oral  SpO2: 98%  Weight: 150 lb (68 kg)  Height: 5\' 6"  (1.676 m)   Body mass index is 24.21 kg/m. Physical Exam  Constitutional: He is oriented to person, place, and time. No distress.  HENT:  Head: Normocephalic and atraumatic.  HOH  Cardiovascular: Normal rate, regular rhythm, normal heart sounds and intact distal pulses.  Pulmonary/Chest: Effort normal and breath sounds normal. No respiratory distress.  Abdominal: Bowel sounds are normal.  Musculoskeletal: Normal range of motion. He exhibits no tenderness.  Neurological: He is alert and oriented to person, place, and time.  Skin: Skin is warm and dry.  Pilonidal cyst opened on its own, only small superficial abrasion at top of sacrum now and erythema over sacrum, scar tissue from prior wounds it appears  Psychiatric: He has a normal mood and affect.    Labs reviewed: Basic Metabolic Panel: Recent Labs    12/18/16 1207 12/18/16 1216 04/16/17 1113  NA 139 142 139  K 3.8 4.1 4.4  CL 105 106 105  CO2 26  --  25  GLUCOSE 125* 123* 137  BUN 26* 28* 36*  CREATININE 1.97* 1.90* 1.93*  CALCIUM 9.0  --  9.5   Liver Function Tests: Recent Labs    12/18/16 1207 04/16/17 1113  AST 14* 15  ALT 11* 11  ALKPHOS 64  --   BILITOT 0.8 0.7  PROT 6.9 6.7   ALBUMIN 3.8  --    No results for input(s): LIPASE, AMYLASE in the last 8760 hours. No results for input(s): AMMONIA in the last 8760 hours. CBC: Recent Labs    12/18/16 1207 12/18/16 1216 12/26/16 1034 04/16/17 1113  WBC 9.8  --  8.9 8.4  NEUTROABS 7.0  --  6,381 5,603  HGB 13.7 13.9 14.2 13.9  HCT 41.6 41.0 42.4 39.6  MCV 94.8  --  91.2 91.0  PLT 176  --  195 195   Lipid Panel: Recent Labs    12/07/16 0816  CHOL 83  HDL 62  LDLCALC 12  TRIG 31  CHOLHDL 1.3   Lab Results  Component Value Date   HGBA1C 8.0 (H) 04/16/2017   Assessment/Plan 1. Controlled type 2 diabetes mellitus with stage 4 chronic kidney disease, with long-term current use of insulin ([place]) -need recheck of hba1c - reports sugars running 80-120s most of time, but had high after eating a cream-filled, iced donut - Hemoglobin A1c today  2. Benign essential hypertension -bp elevated today, but they rushed here and his wife was weaving through traffic on [address] after losing her keys in [employer] -reports 140s at home and had dizziness with lower bps -cont amlodipine 2.5mg  daily  3. Hyperlipidemia associated with type 2 diabetes mellitus ([place]) -has refused statin therapy and is now 84 yo, weak, unsteady -not on meds -tries to eat healthy (except above donut exception)  4. H/O pilonidal cyst -appears it did rupture on its own and now he's just tender at top of gluteal crease where it was, but I don't feel any infection under the skin or remaining abscess  5. Fall, initial encounter -in context of #6 from very very sweet donut  6. Acute hyperglycemia -due to poor food choice, will avoid in the future  Labs/tests ordered:   Orders Placed This Encounter  Procedures  . Hemoglobin A1c    Next appt:  12/24/2017, come fasting   Levonne Carreras L. Eloy Fehl, D.O. [facility] [address], [place] [address] Cell Phone (Mon-Fri 8am-5pm):   [phone] On Call:  [phone] & follow prompts after 5pm & weekends Office Phone:  [phone] Office Fax:  [phone]

## 2017-08-14 ENCOUNTER — Encounter: Payer: Self-pay | Admitting: *Deleted

## 2017-08-14 LAB — HEMOGLOBIN A1C
Hgb A1c MFr Bld: 7.3 % of total Hgb — ABNORMAL HIGH (ref <5.7)
Mean Plasma Glucose: 163 (calc)
eAG (mmol/L): 9 (calc)

## 2017-09-07 ENCOUNTER — Other Ambulatory Visit: Payer: Self-pay | Admitting: *Deleted

## 2017-09-07 MED ORDER — INSULIN DEGLUDEC 100 UNIT/ML ~~LOC~~ SOLN: 15.0000 [IU] | Freq: Every day | SUBCUTANEOUS | 6 refills | Status: DC

## 2017-09-20 ENCOUNTER — Telehealth: Payer: Self-pay

## 2017-09-20 NOTE — Telephone Encounter (Signed)
I spoke with patient's wife and she stated that they did not request supplies from [facility]. She asked that patient be removed from their solicitation list.   Forms were faxed back to [facility] at [phone].

## 2017-10-12 ENCOUNTER — Telehealth: Payer: Self-pay | Admitting: *Deleted

## 2017-10-12 MED ORDER — GLUCOSE BLOOD VI STRP: ORAL_STRIP | 3 refills | Status: DC

## 2017-10-12 NOTE — Telephone Encounter (Signed)
Received fax from [facility] asking for diabetes supplies, need to know what all they need? .left message to have patient return my call.

## 2017-10-12 NOTE — Telephone Encounter (Signed)
Patient called back and only need test strips, rx sent to pharmacy by e-script

## 2017-10-16 ENCOUNTER — Other Ambulatory Visit: Payer: Self-pay | Admitting: *Deleted

## 2017-10-16 MED ORDER — ACCU-CHEK SOFTCLIX LANCETS MISC: 100.0000 | Freq: Three times a day (TID) | 3 refills | Status: DC

## 2017-10-16 MED ORDER — ACCU-CHEK AVIVA PLUS W/DEVICE KIT: 1.0000 [IU] | PACK | 0 refills | Status: DC

## 2017-11-05 ENCOUNTER — Other Ambulatory Visit: Payer: Self-pay | Admitting: Internal Medicine

## 2017-11-05 DIAGNOSIS — E1129 Type 2 diabetes mellitus with other diabetic kidney complication: Secondary | ICD-10-CM

## 2017-11-05 DIAGNOSIS — R809 Proteinuria, unspecified: Principal | ICD-10-CM

## 2017-11-13 ENCOUNTER — Other Ambulatory Visit: Payer: Self-pay | Admitting: *Deleted

## 2017-11-13 DIAGNOSIS — E1129 Type 2 diabetes mellitus with other diabetic kidney complication: Secondary | ICD-10-CM

## 2017-11-13 DIAGNOSIS — R809 Proteinuria, unspecified: Principal | ICD-10-CM

## 2017-11-13 MED ORDER — LINAGLIPTIN 5 MG PO TABS: 5.0000 mg | ORAL_TABLET | Freq: Every day | ORAL | 1 refills | Status: DC

## 2017-11-13 NOTE — Telephone Encounter (Signed)
[facility]

## 2017-12-24 ENCOUNTER — Other Ambulatory Visit: Payer: Self-pay | Admitting: Internal Medicine

## 2017-12-24 ENCOUNTER — Ambulatory Visit (INDEPENDENT_AMBULATORY_CARE_PROVIDER_SITE_OTHER): Payer: Medicare PPO

## 2017-12-24 ENCOUNTER — Encounter: Payer: Self-pay | Admitting: Internal Medicine

## 2017-12-24 ENCOUNTER — Ambulatory Visit (INDEPENDENT_AMBULATORY_CARE_PROVIDER_SITE_OTHER): Payer: Medicare PPO | Admitting: Internal Medicine

## 2017-12-24 VITALS — BP 142/80 | HR 63 | Temp 97.7°F | Ht 66.0 in | Wt 150.0 lb

## 2017-12-24 DIAGNOSIS — Z135 Encounter for screening for eye and ear disorders: Secondary | ICD-10-CM

## 2017-12-24 DIAGNOSIS — Z23 Encounter for immunization: Secondary | ICD-10-CM

## 2017-12-24 DIAGNOSIS — I495 Sick sinus syndrome: Secondary | ICD-10-CM

## 2017-12-24 DIAGNOSIS — E785 Hyperlipidemia, unspecified: Secondary | ICD-10-CM

## 2017-12-24 DIAGNOSIS — Z794 Long term (current) use of insulin: Secondary | ICD-10-CM

## 2017-12-24 DIAGNOSIS — N184 Chronic kidney disease, stage 4 (severe): Secondary | ICD-10-CM | POA: Diagnosis not present

## 2017-12-24 DIAGNOSIS — I700 Atherosclerosis of aorta: Secondary | ICD-10-CM

## 2017-12-24 DIAGNOSIS — E1122 Type 2 diabetes mellitus with diabetic chronic kidney disease: Secondary | ICD-10-CM

## 2017-12-24 DIAGNOSIS — I10 Essential (primary) hypertension: Secondary | ICD-10-CM | POA: Diagnosis not present

## 2017-12-24 DIAGNOSIS — Z0000 Encounter for general adult medical examination without abnormal findings: Secondary | ICD-10-CM | POA: Diagnosis not present

## 2017-12-24 DIAGNOSIS — H353220 Exudative age-related macular degeneration, left eye, stage unspecified: Secondary | ICD-10-CM

## 2017-12-24 DIAGNOSIS — M1712 Unilateral primary osteoarthritis, left knee: Secondary | ICD-10-CM | POA: Diagnosis not present

## 2017-12-24 DIAGNOSIS — E1169 Type 2 diabetes mellitus with other specified complication: Secondary | ICD-10-CM | POA: Diagnosis not present

## 2017-12-24 DIAGNOSIS — R296 Repeated falls: Secondary | ICD-10-CM | POA: Insufficient documentation

## 2017-12-24 DIAGNOSIS — R809 Proteinuria, unspecified: Principal | ICD-10-CM

## 2017-12-24 DIAGNOSIS — R2689 Other abnormalities of gait and mobility: Secondary | ICD-10-CM | POA: Insufficient documentation

## 2017-12-24 DIAGNOSIS — E1129 Type 2 diabetes mellitus with other diabetic kidney complication: Secondary | ICD-10-CM

## 2017-12-24 MED ORDER — METHYLPREDNISOLONE ACETATE 40 MG/ML IJ SUSP
40.0000 mg | Freq: Once | INTRAMUSCULAR | Status: AC
  Administered 2017-12-24: 40 mg via INTRA_ARTICULAR

## 2017-12-24 MED ORDER — ZOSTER VAC RECOMB ADJUVANTED 50 MCG/0.5ML IM SUSR: 0.5000 mL | Freq: Once | INTRAMUSCULAR | 1 refills | Status: AC

## 2017-12-24 NOTE — Patient Instructions (Addendum)
[name] , Thank you for taking time to come for your Medicare Wellness Visit. I appreciate your ongoing commitment to your health goals. Please review the following plan we discussed and let me know if I can assist you in the future.   Screening recommendations/referrals: Colonoscopy excluded, over age 82 Recommended yearly ophthalmology/optometry visit for glaucoma screening and checkup Recommended yearly dental visit for hygiene and checkup  Vaccinations: Influenza vaccine given today Pneumococcal vaccine up to date, completed Tdap vaccine up to date, due 04/16/2021 Shingles vaccine due, ordered to pharmacy   Please make eye appointment at [facility] in [place]  Advanced directives: Please bring [place] a copy of your living will and health care power of attorney  Conditions/risks identified: none  Next appointment: Tyson Dense, RN 12/27/2018 @ 9:15am  Preventive Care 33 Years and Older, Male Preventive care refers to lifestyle choices and visits with your health care provider that can promote health and wellness. What does preventive care include?  A yearly physical exam. This is also called an annual well check.  Dental exams once or twice a year.  Routine eye exams. Ask your health care provider how often you should have your eyes checked.  Personal lifestyle choices, including:  Daily care of your teeth and gums.  Regular physical activity.  Eating a healthy diet.  Avoiding tobacco and drug use.  Limiting alcohol use.  Practicing safe sex.  Taking low doses of aspirin every day.  Taking vitamin and mineral supplements as recommended by your health care provider. What happens during an annual well check? The services and screenings done by your health care provider during your annual well check will depend on your age, overall health, lifestyle risk factors, and family history of disease. Counseling  Your health care provider may ask you questions about  your:  Alcohol use.  Tobacco use.  Drug use.  Emotional well-being.  Home and relationship well-being.  Sexual activity.  Eating habits.  History of falls.  Memory and ability to understand (cognition).  Work and work Statistician. Screening  You may have the following tests or measurements:  Height, weight, and BMI.  Blood pressure.  Lipid and cholesterol levels. These may be checked every 5 years, or more frequently if you are over 61 years old.  Skin check.  Lung cancer screening. You may have this screening every year starting at age 56 if you have a 30-pack-year history of smoking and currently smoke or have quit within the past 15 years.  Fecal occult blood test (FOBT) of the stool. You may have this test every year starting at age 43.  Flexible sigmoidoscopy or colonoscopy. You may have a sigmoidoscopy every 5 years or a colonoscopy every 10 years starting at age 92.  Prostate cancer screening. Recommendations will vary depending on your family history and other risks.  Hepatitis C blood test.  Hepatitis B blood test.  Sexually transmitted disease (STD) testing.  Diabetes screening. This is done by checking your blood sugar (glucose) after you have not eaten for a while (fasting). You may have this done every 1-3 years.  Abdominal aortic aneurysm (AAA) screening. You may need this if you are a current or former smoker.  Osteoporosis. You may be screened starting at age 62 if you are at high risk. Talk with your health care provider about your test results, treatment options, and if necessary, the need for more tests. Vaccines  Your health care provider may recommend certain vaccines, such as:  Influenza  vaccine. This is recommended every year.  Tetanus, diphtheria, and acellular pertussis (Tdap, Td) vaccine. You may need a Td booster every 10 years.  Zoster vaccine. You may need this after age 25.  Pneumococcal 13-valent conjugate (PCV13) vaccine.  One dose is recommended after age 76.  Pneumococcal polysaccharide (PPSV23) vaccine. One dose is recommended after age 43. Talk to your health care provider about which screenings and vaccines you need and how often you need them. This information is not intended to replace advice given to you by your health care provider. Make sure you discuss any questions you have with your health care provider. Document Released: 03/19/2015 Document Revised: 11/10/2015 Document Reviewed: 12/22/2014 Elsevier Interactive Patient Education  2017 [place] Prevention in the Home Falls can cause injuries. They can happen to people of all ages. There are many things you can do to make your home safe and to help prevent falls. What can I do on the outside of my home?  Regularly fix the edges of walkways and driveways and fix any cracks.  Remove anything that might make you trip as you walk through a door, such as a raised step or threshold.  Trim any bushes or trees on the path to your home.  Use bright outdoor lighting.  Clear any walking paths of anything that might make someone trip, such as rocks or tools.  Regularly check to see if handrails are loose or broken. Make sure that both sides of any steps have handrails.  Any raised decks and porches should have guardrails on the edges.  Have any leaves, snow, or ice cleared regularly.  Use sand or salt on walking paths during winter.  Clean up any spills in your garage right away. This includes oil or grease spills. What can I do in the bathroom?  Use night lights.  Install grab bars by the toilet and in the tub and shower. Do not use towel bars as grab bars.  Use non-skid mats or decals in the tub or shower.  If you need to sit down in the shower, use a plastic, non-slip stool.  Keep the floor dry. Clean up any water that spills on the floor as soon as it happens.  Remove soap buildup in the tub or shower regularly.  Attach bath  mats securely with double-sided non-slip rug tape.  Do not have throw rugs and other things on the floor that can make you trip. What can I do in the bedroom?  Use night lights.  Make sure that you have a light by your bed that is easy to reach.  Do not use any sheets or blankets that are too big for your bed. They should not hang down onto the floor.  Have a firm chair that has side arms. You can use this for support while you get dressed.  Do not have throw rugs and other things on the floor that can make you trip. What can I do in the kitchen?  Clean up any spills right away.  Avoid walking on wet floors.  Keep items that you use a lot in easy-to-reach places.  If you need to reach something above you, use a strong step stool that has a grab bar.  Keep electrical cords out of the way.  Do not use floor polish or wax that makes floors slippery. If you must use wax, use non-skid floor wax.  Do not have throw rugs and other things on the floor that can  make you trip. What can I do with my stairs?  Do not leave any items on the stairs.  Make sure that there are handrails on both sides of the stairs and use them. Fix handrails that are broken or loose. Make sure that handrails are as long as the stairways.  Check any carpeting to make sure that it is firmly attached to the stairs. Fix any carpet that is loose or worn.  Avoid having throw rugs at the top or bottom of the stairs. If you do have throw rugs, attach them to the floor with carpet tape.  Make sure that you have a light switch at the top of the stairs and the bottom of the stairs. If you do not have them, ask someone to add them for you. What else can I do to help prevent falls?  Wear shoes that:  Do not have high heels.  Have rubber bottoms.  Are comfortable and fit you well.  Are closed at the toe. Do not wear sandals.  If you use a stepladder:  Make sure that it is fully opened. Do not climb a closed  stepladder.  Make sure that both sides of the stepladder are locked into place.  Ask someone to hold it for you, if possible.  Clearly mark and make sure that you can see:  Any grab bars or handrails.  First and last steps.  Where the edge of each step is.  Use tools that help you move around (mobility aids) if they are needed. These include:  Canes.  Walkers.  Scooters.  Crutches.  Turn on the lights when you go into a dark area. Replace any light bulbs as soon as they burn out.  Set up your furniture so you have a clear path. Avoid moving your furniture around.  If any of your floors are uneven, fix them.  If there are any pets around you, be aware of where they are.  Review your medicines with your doctor. Some medicines can make you feel dizzy. This can increase your chance of falling. Ask your doctor what other things that you can do to help prevent falls. This information is not intended to replace advice given to you by your health care provider. Make sure you discuss any questions you have with your health care provider. Document Released: 12/17/2008 Document Revised: 07/29/2015 Document Reviewed: 03/27/2014 Elsevier Interactive Patient Education  2017 [employer].

## 2017-12-24 NOTE — Progress Notes (Addendum)
Subjective:   Wesley Garcia is a 84 y.o. male who presents for an Initial Medicare Annual Wellness Visit.   Objective:    Today's Vitals   12/24/17 0915  BP: (!) 142/80  Pulse: 63  Temp: 97.7 F (36.5 C)  TempSrc: Oral  Weight: 150 lb (68 kg)  Height: 5' 6"  (1.676 m)   Body mass index is 24.21 kg/m.  Advanced Directives 08/13/2017 04/16/2017 12/26/2016 12/18/2016 08/14/2016 06/26/2016 04/17/2014  Does Patient Have a Medical Advance Directive? Yes No Yes No No No No  Type of Advance Directive Out of facility DNR (pink MOST or yellow form) - Press photographer;Living will - - - -  Does patient want to make changes to medical advance directive? No - Patient declined - - - - - -  Copy of Press photographer in Chart? - - Yes - - - -  Would patient like information on creating a medical advance directive? - No - Patient declined - - No - Patient declined - No - patient declined information  Pre-existing out of facility DNR order (yellow form or pink MOST form) Yellow form placed in chart (order not valid for inpatient use) - - - - - -    Current Medications (verified) Outpatient Encounter Medications as of 12/24/2017  Medication Sig  . ACCU-CHEK SOFTCLIX LANCETS lancets 100 each by Other route 3 (three) times daily. Use as instructed Dx:E11.22  . amLODipine (NORVASC) 2.5 MG tablet Take 1 tablet (2.5 mg total) by mouth daily.  . Blood Glucose Monitoring Suppl (ACCU-CHEK AVIVA PLUS) w/Device KIT 1 Units by Does not apply route as directed. Use as directed to check blood sugar. Dx:E11.22  . EASY COMFORT PEN NEEDLES 31G X 5 MM MISC use as directed TO INJECT INSULIN TO CONTROL BLOOD SUGAR  . glucose blood (ACCU-CHEK AVIVA) test strip Use to test blood sugar three times daily. Dx:E11.22  . Insulin Degludec (TRESIBA) 100 UNIT/ML SOLN Inject 15 Units into the skin daily.  Marland Kitchen linagliptin (TRADJENTA) 5 MG TABS tablet Take 1 tablet (5 mg total) by mouth daily.  . Nutritional  Supplements (HIGH-PROTEIN NUTRITIONAL SHAKE) LIQD Take by mouth as needed.  . Zoster Vaccine Adjuvanted [facility]) injection Inject 0.5 mLs into the muscle once for 1 dose.  . [DISCONTINUED] Zoster Vaccine Adjuvanted [facility]) injection Inject 0.5 mLs into the muscle once.   No facility-administered encounter medications on file as of 12/24/2017.     Allergies (verified) Patient has no known allergies.   History: Past Medical History:  Diagnosis Date  . Chronic kidney disease (CKD), stage III (moderate) (HCC)   . Diabetes mellitus   . Diverticulosis   . Hard of hearing   . Hemorrhoids   . Hx of adenomatous colonic polyps 2006  . Hypertension   . 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 CHOLECYSTECTOMY N/A 04/17/2014   Procedure: LAPAROSCOPIC CHOLECYSTECTOMY WITH INTRAOPERATIVE CHOLANGIOGRAM;  Surgeon: Erroll Luna, MD;  Location: [place];  Service: General;  Laterality: N/A;  . COLONOSCOPY  11/02/2007  . MAXILLECTOMY Right 2003   SCCA alveolar ridge   Family History  Problem Relation Age of Onset  . Cancer Mother        ?  Marland Kitchen Bone cancer Sister   . Diabetes Brother   . Kidney disease Sister   . Colon cancer Neg Hx    Social History   Socioeconomic History  . Marital status:  Married    Spouse name: Not on file  . Number of children: 5  . Years of education: Not on file  . Highest education level: Not on file  Occupational History  . Occupation: Retired  Scientific laboratory technician  . Financial resource strain: Not hard at all  . Food insecurity:    Worry: Never true    Inability: Never true  . Transportation needs:    Medical: No    Non-medical: No  Tobacco Use  . Smoking status: Former Smoker    Types: Cigars    Last attempt to quit: 04/07/2000    Years since quitting: 17.7  . Smokeless tobacco: Never Used  Substance and Sexual Activity  . Alcohol use: Yes    Comment: socially  . Drug use: No  . Sexual  activity: Never  Lifestyle  . Physical activity:    Days per week: 0 days    Minutes per session: 0 min  . Stress: Not at all  Relationships  . Social connections:    Talks on phone: More than three times a week    Gets together: More than three times a week    Attends religious service: More than 4 times per year    Active member of club or organization: No    Attends meetings of clubs or organizations: Never    Relationship status: Married  Other Topics Concern  . Not on file  Social History Narrative   Retired, married   Tobacco Counseling Counseling given: Not Answered   Clinical Intake:  Pre-visit preparation completed: No  Pain : No/denies pain     Diabetes: Yes CBG done?: No Did pt. bring in CBG monitor from home?: No  How often do you need to have someone help you when you read instructions, pamphlets, or other written materials from your doctor or pharmacy?: 2 - Rarely What is the last grade level you completed in school?: high school  Interpreter Needed?: No  Information entered by :: Tyson Dense, RN  Activities of Daily Living In your present state of health, do you have any difficulty performing the following activities: 12/24/2017  Hearing? Y  Vision? N  Difficulty concentrating or making decisions? N  Walking or climbing stairs? Y  Dressing or bathing? N  Doing errands, shopping? N  Preparing Food and eating ? N  Using the Toilet? N  In the past six months, have you accidently leaked urine? Y  Do you have problems with loss of bowel control? N  Managing your Medications? N  Managing your Finances? N  Housekeeping or managing your Housekeeping? N  Some recent data might be hidden     Immunizations and Health Maintenance Immunization History  Administered Date(s) Administered  . Influenza, High Dose Seasonal PF 12/11/2016  . Influenza,inj,Quad PF,6+ Mos 12/16/2012, 12/04/2013  . Influenza-Unspecified 12/05/2015  . Pneumococcal  Conjugate-13 03/06/2010, 09/01/2013  . Pneumococcal Polysaccharide-23 08/14/2016  . Tdap 04/17/2011  . Zoster 08/16/2010   Health Maintenance Due  Topic Date Due  . OPHTHALMOLOGY EXAM  01/16/1940  . COLONOSCOPY  01/29/2016  . INFLUENZA VACCINE  10/04/2017  . URINE MICROALBUMIN  12/07/2017  . FOOT EXAM  12/11/2017    Patient Care Team: Gayland Curry, DO as PCP - General (Geriatric Medicine)  Indicate any recent Medical Services you may have received from other than [facility] providers in the past year (date may be approximate).    Assessment:   This is a routine wellness examination for Jidenna.  Hearing/Vision  screen Hearing Screening Comments: Hearing aids Vision Screening Comments: Is going to make appointment with [facility] in [place]  Dietary issues and exercise activities discussed: Current Exercise Habits: The patient does not participate in regular exercise at present, Exercise limited by: orthopedic condition(s)  Goals   None    Depression Screen PHQ 2/9 Scores 12/24/2017 12/24/2017 08/13/2017 04/16/2017  PHQ - 2 Score 1 0 0 0    Fall Risk Fall Risk  12/24/2017 12/24/2017 08/13/2017 04/30/2017 04/16/2017  Falls in the past year? Yes Yes Yes No No  Number falls in past yr: 2 or more 2 or more 1 - -  Injury with Fall? Yes Yes - - -  Comment - - - - -    Is the patient's home free of loose throw rugs in walkways, pet beds, electrical cords, etc?   yes      Grab bars in the bathroom? yes      Handrails on the stairs?   yes      Adequate lighting?   yes  Timed Get Up and Go performed: 20 seconds  Cognitive Function: MMSE - Mini Mental State Exam 12/24/2017  Orientation to time 5  Orientation to Place 5  Registration 3  Attention/ Calculation 5  Recall 3  Language- name 2 objects 2  Language- repeat 1  Language- follow 3 step command 3  Language- read & follow direction 1  Write a sentence 1  Copy design 0  Total score 29        Screening Tests Health  Maintenance  Topic Date Due  . OPHTHALMOLOGY EXAM  01/16/1940  . COLONOSCOPY  01/29/2016  . INFLUENZA VACCINE  10/04/2017  . URINE MICROALBUMIN  12/07/2017  . FOOT EXAM  12/11/2017  . HEMOGLOBIN A1C  02/12/2018  . TETANUS/TDAP  04/16/2021  . PNA vac Low Risk Adult  Completed    Qualifies for Shingles Vaccine? Yes, educated and ordered to pharmacy  Cancer Screenings: Lung: Low Dose CT Chest recommended if Age 59-80 years, 30 pack-year currently smoking OR have quit w/in 15years. Patient does not qualify. Colorectal: excluded, over age 99  Additional Screenings:  Hepatitis C Screening: declined Diabetic eye exam due: says they got a reminder letter in the mail last week. [facility] in [place] put in      Plan:    I have personally reviewed and addressed the Medicare Annual Wellness questionnaire and have noted the following in the patient's chart:  A. Medical and social history B. Use of alcohol, tobacco or illicit drugs  C. Current medications and supplements D. Functional ability and status E.  Nutritional status F.  Physical activity G. Advance directives H. List of other physicians I.  Hospitalizations, surgeries, and ER visits in previous 12 months J.  Annex to include hearing, vision, cognitive, depression L. Referrals and appointments - none  In addition, I have reviewed and discussed with patient certain preventive protocols, quality metrics, and best practice recommendations. A written personalized care plan for preventive services as well as general preventive health recommendations were provided to patient.  See attached scanned questionnaire for additional information.   Signed,   Tyson Dense, RN Nurse Health Advisor  Patient Concerns: None

## 2017-12-24 NOTE — Addendum Note (Signed)
Addended by: Despina Hidden on: 12/24/2017 10:14 AM   Modules accepted: Orders

## 2017-12-24 NOTE — Progress Notes (Addendum)
Location:  [facility] clinic Provider:  Nkosi Cortright L. Mariea Clonts, D.O., C.M.D.  Code Status: DNR Goals of Care:  Advanced Directives 08/13/2017  Does Patient Have a Medical Advance Directive? Yes  Type of Advance Directive Out of facility DNR (pink MOST or yellow form)  Does patient want to make changes to medical advance directive? No - Patient declined  Copy of [place] in Chart? -  Would patient like information on creating a medical advance directive? -  Pre-existing out of facility DNR order (yellow form or pink MOST form) Yellow form placed in chart (order not valid for inpatient use)   Chief Complaint  Patient presents with  . Medical Management of Chronic Issues    18mh follow-up    HPI: Patient is a 84y.o. male seen today for medical management of chronic diseases.    Balance is his biggest problem. Had two falls since his last visit.  He was getting ready to sit down after walking two rounds in the mall.  He was worn out.  He even had his cane.  He went to sit down in the food court and he fell over a chair.  He was bleeding "from one end to the other".  Did not hit head that time.    Second time, he got up from a chair and fell backwards.  He was able to get up himself afterwards.  Unclear if one of these was associated with the creme-filled donut.  He did hit his head with this fall.    He did not check his sugar either times.  He cut down his insulin from 15 to 12 units due to cost of insulin.  Sugar has "been in line" except one day he forgot to take his shot.  It was out of whack a couple of days then.  He's not sure why he forgot it.  He follows the same routine each morning.  Sit before breakfast and takes bp and hr, then sugar.  CBG on avg 100, lowest 80s.  [id number] three days.  Get together at the church.    Over 2 years since last eye visit.  Says eyes are not as good.    His daughter is in the hospital after an amputation and is getting  dialyzed.  Past Medical History:  Diagnosis Date  . Chronic kidney disease (CKD), stage III (moderate) (HCC)   . Diabetes mellitus   . Diverticulosis   . Hard of hearing   . Hemorrhoids   . Hx of adenomatous colonic polyps 2006  . Hypertension   . Squamous cell carcinoma of hard palate ([place] 2003    Past Surgical History:  Procedure Laterality Date  . CHOLECYSTECTOMY N/A 04/17/2014   Procedure: LAPAROSCOPIC CHOLECYSTECTOMY WITH INTRAOPERATIVE CHOLANGIOGRAM;  Surgeon: TErroll Luna MD;  Location[place] [facility]  Service: General;  Laterality: N/A;  . COLONOSCOPY  11/02/2007  . MAXILLECTOMY Right 2003   SCCA alveolar ridge    No Known Allergies  Outpatient Encounter Medications as of 12/24/2017  Medication Sig  . ACCU-CHEK SOFTCLIX LANCETS lancets 100 each by Other route 3 (three) times daily. Use as instructed Dx:E11.22  . amLODipine (NORVASC) 2.5 MG tablet Take 1 tablet (2.5 mg total) by mouth daily.  . Blood Glucose Monitoring Suppl (ACCU-CHEK AVIVA PLUS) w/Device KIT 1 Units by Does not apply route as directed. Use as directed to check blood sugar. Dx:E11.22  . EASY COMFORT PEN NEEDLES 31G X 5 MM MISC use as directed  TO INJECT INSULIN TO CONTROL BLOOD SUGAR  . glucose blood (ACCU-CHEK AVIVA) test strip Use to test blood sugar three times daily. Dx:E11.22  . Insulin Degludec (TRESIBA) 100 UNIT/ML SOLN Inject 15 Units into the skin daily.  Marland Kitchen linagliptin (TRADJENTA) 5 MG TABS tablet Take 1 tablet (5 mg total) by mouth daily.  . Nutritional Supplements (HIGH-PROTEIN NUTRITIONAL SHAKE) LIQD Take by mouth as needed.   No facility-administered encounter medications on file as of 12/24/2017.     Review of Systems:  Review of Systems  Constitutional: Negative for chills and fever.  HENT: Positive for hearing loss. Negative for congestion.   Eyes: Negative for blurred vision.  Respiratory: Negative for cough and shortness of breath.   Cardiovascular: Negative for chest pain, palpitations  and leg swelling.  Gastrointestinal: Negative for abdominal pain, blood in stool, constipation, diarrhea and melena.  Genitourinary: Negative for dysuria.  Musculoskeletal: Positive for joint pain. Negative for falls.  Skin: Negative for itching and rash.  Neurological: Negative for dizziness and loss of consciousness.       Balance problems  Endo/Heme/Allergies: Does not bruise/bleed easily.  Psychiatric/Behavioral: Positive for memory loss. Negative for depression. The patient is not nervous/anxious and does not have insomnia.     Health Maintenance  Topic Date Due  . OPHTHALMOLOGY EXAM  01/16/1940  . COLONOSCOPY  01/29/2016  . INFLUENZA VACCINE  10/04/2017  . URINE MICROALBUMIN  12/07/2017  . FOOT EXAM  12/11/2017  . HEMOGLOBIN A1C  02/12/2018  . TETANUS/TDAP  04/16/2021  . PNA vac Low Risk Adult  Completed    Physical Exam: Vitals:   12/24/17 0828  Weight: 150 lb (68 kg)  Height: _0  (1.676 m)   Body mass index is 24.21 kg/m. Physical Exam  Constitutional: He is oriented to person, place, and time. No distress.  Cardiovascular: Normal rate, regular rhythm, normal heart sounds and intact distal pulses.  Pulmonary/Chest: Effort normal and breath sounds normal. No respiratory distress.  Abdominal: Soft. Bowel sounds are normal.  Musculoskeletal: Normal range of motion.  Tenderness over left medial knee  Neurological: He is alert and oriented to person, place, and time. No cranial nerve deficit.  Skin: Skin is warm and dry. Capillary refill takes less than 2 seconds.  Psychiatric: He has a normal mood and affect.   Labs reviewed: Basic Metabolic Panel: Recent Labs    04/16/17 1113 08/08/17  NA 139 140  K 4.4 4.7  CL 105  --   CO2 25  --   GLUCOSE 137  --   BUN 36* 31*  CREATININE 1.93* 2.2*  CALCIUM 9.5  --    Liver Function Tests: Recent Labs    04/16/17 1113  AST 15  ALT 11  BILITOT 0.7  PROT 6.7   No results for input(s): LIPASE, AMYLASE in the  last 8760 hours. No results for input(s): AMMONIA in the last 8760 hours. CBC: Recent Labs    12/26/16 1034 04/16/17 1113 08/08/17  WBC 8.9 8.4  --   NEUTROABS 6,381 5,603  --   HGB 14.2 13.9 13.9  HCT 42.4 39.6  --   MCV 91.2 91.0  --   PLT 195 195  --    Lipid Panel: No results for input(s): CHOL, HDL, LDLCALC, TRIG, CHOLHDL, LDLDIRECT in the last 8760 hours. Lab Results  Component Value Date   HGBA1C 7.3 (H) 08/13/2017    Assessment/Plan 1. Controlled type 2 diabetes mellitus with stage 4 chronic kidney disease, with long-term current  use of insulin (HCC) - cont current insulin (tresiba)--unfortunately we don't get samples of this insulin--pt using less due to cost - sugars sound controlled, but all checked are fasting; no known lows, but has had two falls (seems falls are related to his knee giving way rather than his sugar) - COMPLETE METABOLIC PANEL WITH GFR - Hemoglobin A1c - Microalbumin / creatinine urine ratio  2. Hyperlipidemia associated with type 2 diabetes mellitus ([place]) -not on statin due to age, debility and pt preference - Lipid panel  3. Chronic kidney disease, stage 4 (severe) (HCC) - Avoid nephrotoxic agents like nsaids, dose adjust renally excreted meds, hydrate. - COMPLETE METABOLIC PANEL WITH GFR -urine microalbumin  4. Benign essential hypertension - bp at goal  - CBC with Differential/Platelet - COMPLETE METABOLIC PANEL WITH GFR  5. Falls frequently -start physical therapy again--ordered at [facility] due to pt living in [place] -suspect some role of his knee OA, also need sugar monitored when more unsteady--educated on this last time but sugar not checked when fell these two times -cont cane use  6. Balance problem - worse lately, has severe left OA and given steroid injection for pain  7. Localized osteoarthritis of left knee -Procedure note left knee injection verbal consent was obtained to inject left knee joint  Timeout was  completed to confirm the site of injection  The medications used were 40 mg of Depo-Medrol and 1% lidocaine 3 cc  Anesthesia was provided by ethyl chloride and the skin was prepped with alcohol.  After cleaning the skin with alcohol a 20-gauge needle was used to inject the left knee joint. There were no complications. A sterile bandage was applied.  8. Exudative age-related macular degeneration of left eye, unspecified stage ([place]) -again reminded importance of eye exam with declining vision--RN going to check who he sees and place referral--[facility] in [place]  9. Aortic atherosclerosis ([place]) -remains, refuses statin therapy due to perceived weakness from it before  10. Tachy-brady syndrome ([place]) -not having issues recently with this  11.  Need for influenza vaccination -given today  Labs/tests ordered:   Orders Placed This Encounter  Procedures  . CBC with Differential/Platelet  . COMPLETE METABOLIC PANEL WITH GFR  . Hemoglobin A1c  . Lipid panel  . Microalbumin / creatinine urine ratio    Next appt:  12/24/2017   Jania Steinke L. Antonella Upson, D.O. [facility] [address], [place] [address] Cell Phone (Mon-Fri 8am-5pm):  [phone] On Call:  [phone] & follow prompts after 5pm & weekends Office Phone:  [phone] Office Fax:  [phone]

## 2017-12-24 NOTE — Addendum Note (Signed)
Addended by: Tyson Dense E on: 12/24/2017 09:48 AM   Modules accepted: Orders

## 2017-12-25 LAB — COMPLETE METABOLIC PANEL WITH GFR
AG Ratio: 1.5 (calc) (ref 1.0–2.5)
ALT: 9 U/L (ref 9–46)
AST: 14 U/L (ref 10–35)
Albumin: 4.2 g/dL (ref 3.6–5.1)
Alkaline phosphatase (APISO): 54 U/L (ref 40–115)
BUN/Creatinine Ratio: 19 (calc) (ref 6–22)
BUN: 35 mg/dL — ABNORMAL HIGH (ref 7–25)
CO2: 26 mmol/L (ref 20–32)
Calcium: 9.7 mg/dL (ref 8.6–10.3)
Chloride: 104 mmol/L (ref 98–110)
Creat: 1.89 mg/dL — ABNORMAL HIGH (ref 0.70–1.11)
GFR, Est African American: 37 mL/min/{1.73_m2} — ABNORMAL LOW (ref >=60)
GFR, Est Non African American: 32 mL/min/{1.73_m2} — ABNORMAL LOW (ref >=60)
Globulin: 2.8 g/dL (calc) (ref 1.9–3.7)
Glucose, Bld: 133 mg/dL — ABNORMAL HIGH (ref 65–99)
Potassium: 4.4 mmol/L (ref 3.5–5.3)
Sodium: 139 mmol/L (ref 135–146)
Total Bilirubin: 0.7 mg/dL (ref 0.2–1.2)
Total Protein: 7 g/dL (ref 6.1–8.1)

## 2017-12-25 LAB — LIPID PANEL
Cholesterol: 89 mg/dL (ref <200)
HDL: 53 mg/dL (ref >40)
LDL Cholesterol (Calc): 26 mg/dL (calc)
Non-HDL Cholesterol (Calc): 36 mg/dL (calc) (ref <130)
Total CHOL/HDL Ratio: 1.7 (calc) (ref <5.0)
Triglycerides: 35 mg/dL (ref <150)

## 2017-12-25 LAB — CBC WITH DIFFERENTIAL/PLATELET
Basophils Absolute: 38 cells/uL (ref 0–200)
Basophils Relative: 0.4 %
Eosinophils Absolute: 85 cells/uL (ref 15–500)
Eosinophils Relative: 0.9 %
HCT: 41.9 % (ref 38.5–50.0)
Hemoglobin: 14.4 g/dL (ref 13.2–17.1)
Lymphs Abs: 1175 cells/uL (ref 850–3900)
MCH: 31.3 pg (ref 27.0–33.0)
MCHC: 34.4 g/dL (ref 32.0–36.0)
MCV: 91.1 fL (ref 80.0–100.0)
MPV: 10.5 fL (ref 7.5–12.5)
Monocytes Relative: 10.9 %
Neutro Abs: 7078 cells/uL (ref 1500–7800)
Neutrophils Relative %: 75.3 %
Platelets: 198 10*3/uL (ref 140–400)
RBC: 4.6 10*6/uL (ref 4.20–5.80)
RDW: 12.8 % (ref 11.0–15.0)
Total Lymphocyte: 12.5 %
WBC mixed population: 1025 cells/uL — ABNORMAL HIGH (ref 200–950)
WBC: 9.4 10*3/uL (ref 3.8–10.8)

## 2017-12-25 LAB — MICROALBUMIN / CREATININE URINE RATIO
Creatinine, Urine: 116 mg/dL (ref 20–320)
Microalb Creat Ratio: 32 mcg/mg creat — ABNORMAL HIGH (ref <30)
Microalb, Ur: 3.7 mg/dL

## 2017-12-25 LAB — HEMOGLOBIN A1C
Hgb A1c MFr Bld: 7.2 % of total Hgb — ABNORMAL HIGH (ref <5.7)
Mean Plasma Glucose: 160 (calc)
eAG (mmol/L): 8.9 (calc)

## 2017-12-27 ENCOUNTER — Encounter (HOSPITAL_COMMUNITY): Payer: Self-pay

## 2017-12-27 ENCOUNTER — Other Ambulatory Visit: Payer: Self-pay

## 2017-12-27 ENCOUNTER — Encounter: Payer: Self-pay | Admitting: *Deleted

## 2017-12-27 ENCOUNTER — Ambulatory Visit (HOSPITAL_COMMUNITY): Payer: Medicare PPO | Attending: Internal Medicine

## 2017-12-27 DIAGNOSIS — Z9181 History of falling: Secondary | ICD-10-CM

## 2017-12-27 DIAGNOSIS — R2689 Other abnormalities of gait and mobility: Secondary | ICD-10-CM

## 2017-12-27 NOTE — Therapy (Signed)
[place], [place], [address] Phone: [phone]   Fax:  [phone]  Physical Therapy Evaluation  Patient Details  Name: Wesley Garcia MRN: [id number] Date of Birth: April 15, 1929 Referring Provider (PT): Gayland Curry, DO   Encounter Date: 12/27/2017  PT End of Session - 12/27/17 1048    Visit Number  1    Number of Visits  1    Authorization Time Period  12/27/2017    Authorization - Visit Number  1    Authorization - Number of Visits  1    PT Start Time  0945    PT Stop Time  1030    PT Time Calculation (min)  45 min    Activity Tolerance  Patient tolerated treatment well    Behavior During Therapy  [facility] for tasks assessed/performed       Past Medical History:  Diagnosis Date  . Chronic kidney disease (CKD), stage III (moderate) (HCC)   . Diabetes mellitus   . Diverticulosis   . Hard of hearing   . Hemorrhoids   . Hx of adenomatous colonic polyps 2006  . Hypertension   . Squamous cell carcinoma of hard palate ([place]) 2003    Past Surgical History:  Procedure Laterality Date  . CHOLECYSTECTOMY N/A 04/17/2014   Procedure: LAPAROSCOPIC CHOLECYSTECTOMY WITH INTRAOPERATIVE CHOLANGIOGRAM;  Surgeon: Erroll Luna, MD;  Location: [facility];  Service: General;  Laterality: N/A;  . COLONOSCOPY  11/02/2007  . MAXILLECTOMY Right 2003   SCCA alveolar ridge    There were no vitals filed for this visit.   Subjective Assessment - 12/27/17 0950    Subjective  Patient arrives with wife and states his co-pay is too high and he will not be able to participate in therapy. He would like a program to work on at home for balance exercises. He reports he has fallen twice at the [facility] where he used to walk 2 laps almost every day. He has stopped doing this.    Limitations  House hold activities;Walking    Patient Stated Goals  to work on home program for balance    Currently in Pain?  No/denies       [facility] PT  Assessment - 12/27/17 0001      Assessment   Medical Diagnosis  Balance Impairments and Frequent Falls    Referring Provider (PT)  Mariea Clonts, Tiffany L, DO    Onset Date/Surgical Date  --   balance worsening over last 3 years     Precautions   Precautions  None      Restrictions   Weight Bearing Restrictions  No      Balance Screen   Has the patient fallen in the past 6 months  Yes    How many times?  2    Has the patient had a decrease in activity level because of a fear of falling?   Yes    Is the patient reluctant to leave their home because of a fear of falling?   Yes      [place]  Private residence    Living Arrangements  Spouse/significant other    Available Help at Discharge  Family   family in [place] to enter    Entrance Stairs-Number of Steps  2    [place]  One  level    [place] - single point;Shower seat;Grab bars - tub/shower    Additional Comments  tub shower      Prior Function   Level of Independence  Independent with community mobility with device;Independent with household mobility without device    Vocation  Retired    Public house manager Requirements  [employer]ir[employer] Wilbur; [place] business after that    Leisure  watch TV, used to walk around [employer]   Overall Cognitive Status  Within Functional Limits for tasks assessed      Observation/Other Assessments   Focus on Therapeutic Outcomes (FOTO)   not taken 1 time visit      Functional Tests   Functional tests  Single leg stance      Single Leg Stance   Comments  Bil LE = 0 seconds      Posture/Postural Control   Posture/Postural Control  Postural limitations    Postural Limitations  Rounded Shoulders;Forward head;Increased thoracic kyphosis;Decreased lumbar lordosis      ROM / Strength   AROM / PROM / Strength  Strength      Strength   Strength Assessment Site   Hip;Knee;Ankle    Right Hip Flexion  4/5    Right Hip Extension  4-/5    Right Hip ABduction  4/5    Left Hip Flexion  4/5    Left Hip Extension  4-/5    Left Hip ABduction  4/5    Right/Left Knee  Right;Left    Right Knee Flexion  4-/5    Right Knee Extension  4+/5    Left Knee Flexion  4-/5    Left Knee Extension  4+/5    Right Ankle Dorsiflexion  4/5    Right Ankle Plantar Flexion  3+/5    Left Ankle Dorsiflexion  4/5    Left Ankle Plantar Flexion  3+/5      Transfers   Five time sit to stand comments   16.58 without UE assistance      Ambulation/Gait   Ambulation/Gait  Yes    Ambulation/Gait Assistance  6: Modified independent (Device/Increase time)    Ambulation Distance (Feet)  270 Feet   2MWT   Assistive device  None    Gait Pattern  Step-through pattern;Decreased step length - left;Decreased step length - right;Decreased stride length;Decreased hip/knee flexion - right;Decreased hip/knee flexion - left;Scissoring;Poor foot clearance - left;Poor foot clearance - right    Ambulation Surface  Level;Indoor    Gait velocity  0.68 m/s    Stairs  Yes    Stairs Assistance  6: Modified independent (Device/Increase time)    Stair Management Technique  Two rails;Alternating pattern;Step to pattern;Forwards   ascends alternating, descends step to pattern   Number of Stairs  4    Height of Stairs  6      Balance   Balance Assessed  Yes      Static Standing Balance   Static Standing - Balance Support  No upper extremity supported    Static Standing - Level of Assistance  5: Stand by assistance    Static Standing Balance -  Activities   Single Leg Stance - Right Leg;Single Leg Stance - Left Leg;Tandam Stance - Right Leg;Tandam Stance - Left Leg    Static Standing - Comment/# of Minutes  Tandem: Rt in front = 3 sec; Lt in front = 5 sec. SLS bil LE = 0 sec  Standardized Balance Assessment   Standardized Balance Assessment  Timed Up and Go Test      Timed Up and Go Test    TUG  Normal TUG    Normal TUG (seconds)  20.17        Objective measurements completed on examination: See above findings.    [facility] Adult PT Treatment/Exercise - 12/27/17 0001      Exercises   Exercises  Knee/Hip      Knee/Hip Exercises: Seated   Other Seated Knee/Hip Exercises  heel/toe raises, 1x 10 reps        Balance Exercises - 12/27/17 1058      Balance Exercises: Standing   Tandem Stance  Eyes open;Upper extremity support 1;2 reps;10 secs   alt foot alignment   SLS  Eyes open;10 secs;2 reps;Upper extremity support 1    Marching Limitations  marching at couner: 1x 5 reps bil LE, 3 sec holds    Heel Raises Limitations  1x 10 reps bil LE with counter support        PT Education - 12/27/17 1044    Education Details  Educated on benefits of physical thearpy to address balance impairments and decrease fall risk. Educated on participatingin [employer] group exercise classes for balanc and strengthening as patient is already a member. Also provided handout on HEP after patient demonstrated competance with performing exercises and handout on decreasing fall risk at home.    Person(s) Educated  Patient;Spouse    Methods  Explanation;Demonstration;Handout    Comprehension  Verbalized understanding;Returned demonstration       PT Short Term Goals - 12/27/17 1051      PT SHORT TERM GOAL #1   Title  Patient will demonstrate safe and proper performance with HEP and verablize understanding of importance to participate in balance classes at local gym/[employer] to improve balance and reduce fall risk.    Time  1    Period  Days    Status  Achieved    Target Date  12/27/17        Plan - 12/27/17 1049    Clinical Impression Statement  Mr. Rack presents for physical therapy evaluation for balance impairments and recent falls. He arrived with his wife and reports that his co-pay is too high for him to attend regular therapy sessions. Objective testing revealed patient is at significant risk  for falls with impaired static balance, impaired balance with gait during TUG, LE weakness, postural impairment, and decreased gait velocity indicating he is a limited community ambulator and would benefit from fall interventions. He was educated today on the benefits of participating in physical therapy and on options for payment plans but was not interested in participating in continued therapy despite education on fall risk. He was provided a handout for home exercises to address balance and encouraged to participate in balance and strength classes as [employer] as patient reports he is a member at the [employer]. He was also provided a handout on fall reduction and strategies to implement at home to reduce risk of falling. He will be discharged with HEP and educational resources as patient declined to participate in therapy.    History and Personal Factors relevant to plan of care:  patient has fallen 2x in last 6 months, DM    Clinical Presentation  Stable    Clinical Presentation due to:  risk for falling, slow gait velocity, SLS, MMT, TUG, clinical judgement    Clinical Decision Making  Low    Rehab  Potential  Fair    PT Frequency  One time visit    PT Treatment/Interventions  Therapeutic exercise;Patient/family education    PT Next Visit Plan  1 time visit with HEP provided and handout on fall risk. Educated patient to begin participating in [facility] classes to reduce fall risk.    PT Home Exercise Plan  Eval: sit to stand, SLS at counter, tandem at counter, marching at counter, heel raises at counter, seated glute set, seated heel/toe raises    Consulted and Agree with Plan of Care  Patient;Family member/caregiver    Family Member Consulted  pt's spouse       Patient will benefit from skilled therapeutic intervention in order to improve the following deficits and impairments:  Abnormal gait, Decreased knowledge of use of DME, Postural dysfunction, Decreased mobility, Decreased activity tolerance,  Decreased endurance, Decreased strength, Difficulty walking, Decreased safety awareness, Decreased balance  Visit Diagnosis: History of falling  Other abnormalities of gait and mobility     Problem List Patient Active Problem List   Diagnosis Date Noted  . Balance problem 12/24/2017  . Falls frequently 12/24/2017  . Aortic atherosclerosis ([place]) 01/01/2017  . Chronic kidney disease, stage 4 (severe) ([place]) 08/14/2016  . Exudative age-related macular degeneration of left eye ([place]) 08/14/2016  . Tachy-brady syndrome ([place]) 08/14/2016  . Localized osteoarthritis of left knee 08/14/2016  . Benign essential hypertension 08/14/2016  . RUQ pain 04/17/2014  . DM (diabetes mellitus) type II controlled with renal manifestation ([place]) 04/17/2014  . Acute cholecystitis 04/17/2014  . Gallstones   . Hyperlipidemia associated with type 2 diabetes mellitus ([place]) 06/10/2012  . Hypertensive kidney disease, benign(403.1) 06/10/2012  . Personal history of colonic adenomas 09/29/2004    Kipp Brood, PT, DPT Physical Therapist with [facility]  12/27/2017 11:39 AM    [place], [place], [address] Phone: [phone]   Fax:  [phone]  Name: Wesley Garcia MRN: [id number] Date of Birth: Jan 06, 1930

## 2017-12-27 NOTE — Patient Instructions (Signed)
Step 1  Step 2  Seated Gluteal Sets reps: 10  sets: 2-3  hold: 5 seconds  daily: 1  weekly: 7 Setup  Begin in a seated upright position. Movement  Tighten the muscles in your buttocks, then relax and repeat. Tip  Make sure to maintain good posture during the exercise and do not hold your breath as you tighten your muscles. Step 1  Step 2  Seated Heel Toe Raises  reps: 10  sets: 2-  hold: 5 seconds  daily: 1  weekly: 7 Setup  Begin sitting upright with your feet shoulder width apart. Movement  Slowly raise your heels off the floor and lower them back down, then raise your toes off the floor and lower them back down. Repeat.  Tip  Make sure to keep the balls of your feet on the floor when you raise your heels, and keep your heels on the floor when you raise your toes. Step 1  Step 2  Heel rises with counter support reps: 10  sets: 2-3  hold: 3 seconds  daily: 1  weekly: 7 Setup  Begin in a standing upright position with your hands resting on a counter in front of you. Movement  Slowly raise your heels off the ground, hold briefly, then lower them back down and repeat. Tip  Make sure to maintain an upright posture and use the counter to help you balance as needed. Do not let your ankles rotate inward or outward. Step 1  Step 2  Standing March with Counter Support reps: 10  sets: 2-3  hold: 5 seconds  daily: 1  weekly: 7 Setup  Begin in a standing upright position with your hands resting on a counter. Movement  Slowly lift one knee to waist height, then lower it back down and repeat.  Tip  Make sure to maintain an upright posture and use the counter to help you balance as needed. Step 1  Step 2  Step 3  Standing Single Leg Stance with Counter Support reps: 10  sets: 2-3  hold: 10 seconds  daily: 1  weekly: 7 Setup  Begin in a standing upright position with your hands resting on a counter. Movement  Lift one foot off the ground and maintain your balance in  this position. Tip  Make sure to maintain an upright posture and use the counter to help you balance as needed. Image: MedBridge logo Login URL: [email or web address] Access Code: [email or web address]  Disclaimer: This program provides exercises related to your condition that you can perform at home. As there is a risk of injury with any activity, use caution when performing exercises. If you experience any pain or discomfort, discontinue the exercises and contact your health care provider. Date printed: 12/27/2017  Step 1  Step 2  Standing Tandem Balance with Counter Support reps: 10  sets: 2-3  hold: 10 seconds  daily: 1  weekly: 7 Setup  Begin in a standing upright position with your hands resting on a counter. Movement  Place one foot directly behind the other, so that you are in a heel-to-toe position. Maintain your balance in this position. Tip  Make sure to maintain an upright posture and use the counter to help you balance as needed. Step 1  Step 2  Step 3  Sit to Stand reps: 10  sets: 3  daily: 1  weekly: 7 back chair up against wall for safety   Setup  Begin sitting upright with your feet flat on the ground underneath  your knees. Movement  Move your shoulders and head over your toes, bring your knees forward, and allow your hips to come off the chair, then push down equally into both feet to stand up. Sit back down and repeat.  Tip  Make sure to keep your weight evenly distributed between both legs, and try to keep your back straight throughout the exercise. Do not lock out your knees once you are standing.

## 2018-01-14 ENCOUNTER — Other Ambulatory Visit: Payer: Self-pay | Admitting: Nurse Practitioner

## 2018-01-14 DIAGNOSIS — I10 Essential (primary) hypertension: Secondary | ICD-10-CM

## 2018-02-15 ENCOUNTER — Other Ambulatory Visit: Payer: Self-pay | Admitting: Internal Medicine

## 2018-02-15 DIAGNOSIS — E1129 Type 2 diabetes mellitus with other diabetic kidney complication: Secondary | ICD-10-CM

## 2018-02-15 DIAGNOSIS — R809 Proteinuria, unspecified: Principal | ICD-10-CM

## 2018-03-05 LAB — HM DIABETES EYE EXAM

## 2018-05-02 ENCOUNTER — Encounter: Payer: Self-pay | Admitting: *Deleted

## 2018-05-02 ENCOUNTER — Encounter: Payer: Self-pay | Admitting: Internal Medicine

## 2018-05-02 ENCOUNTER — Ambulatory Visit (INDEPENDENT_AMBULATORY_CARE_PROVIDER_SITE_OTHER): Payer: Medicare PPO | Admitting: Internal Medicine

## 2018-05-02 VITALS — BP 148/80 | HR 54 | Temp 97.5°F | Ht 66.0 in | Wt 152.0 lb

## 2018-05-02 DIAGNOSIS — Z794 Long term (current) use of insulin: Secondary | ICD-10-CM

## 2018-05-02 DIAGNOSIS — H353220 Exudative age-related macular degeneration, left eye, stage unspecified: Secondary | ICD-10-CM | POA: Diagnosis not present

## 2018-05-02 DIAGNOSIS — F324 Major depressive disorder, single episode, in partial remission: Secondary | ICD-10-CM

## 2018-05-02 DIAGNOSIS — I700 Atherosclerosis of aorta: Secondary | ICD-10-CM

## 2018-05-02 DIAGNOSIS — E11319 Type 2 diabetes mellitus with unspecified diabetic retinopathy without macular edema: Secondary | ICD-10-CM | POA: Diagnosis not present

## 2018-05-02 DIAGNOSIS — E1122 Type 2 diabetes mellitus with diabetic chronic kidney disease: Secondary | ICD-10-CM | POA: Diagnosis not present

## 2018-05-02 DIAGNOSIS — I495 Sick sinus syndrome: Secondary | ICD-10-CM

## 2018-05-02 DIAGNOSIS — N184 Chronic kidney disease, stage 4 (severe): Secondary | ICD-10-CM | POA: Diagnosis not present

## 2018-05-02 DIAGNOSIS — F320 Major depressive disorder, single episode, mild: Secondary | ICD-10-CM | POA: Insufficient documentation

## 2018-05-02 NOTE — Patient Instructions (Signed)
Cut down on sweets especially when you eat out because they are running your sugar up.

## 2018-05-02 NOTE — Progress Notes (Signed)
Location:  [facility] clinic Provider:  Zidane Renner L. Mariea Clonts, D.O., C.M.D.  Code Status: DNR Goals of Care:  Advanced Directives 12/27/2017  Does Patient Have a Medical Advance Directive? Yes  Type of Advance Directive Living will  Does patient want to make changes to medical advance directive? No - Patient declined  Copy of [place] in Chart? -  Would patient like information on creating a medical advance directive? No - Patient declined  Pre-existing out of facility DNR order (yellow form or pink MOST form) -     Chief Complaint  Patient presents with  . Medical Management of Chronic Issues    [facility] follow-up    HPI: Patient is a 85y.o. adult seen today for medical management of chronic diseases.    He stopped tradjenta due to $44.  We're going to find out the cost.  He says his sugar is going to be up.  Every time he eats out, his sugar goes up.  Even at the church.  When he eats at home, it's ok.    Had laser tx in his left eye.  Went to Dr. GKaty Fitch  He thought better glasses would help but it won't.    He's been back to Dr. WJustin Mendlast month for his kidneys and things were stable except his sugar is high.    [facility]  Their grandson is watching the dog.    Past Medical History:  Diagnosis Date  . Chronic kidney disease (CKD), stage III (moderate) (HCC)   . Diabetes mellitus   . Diverticulosis   . Hard of hearing   . Hemorrhoids   . Hx of adenomatous colonic polyps 2006  . Hypertension   . Squamous cell carcinoma of hard palate ([place] 2003    Past Surgical History:  Procedure Laterality Date  . CHOLECYSTECTOMY N/A 04/17/2014   Procedure: LAPAROSCOPIC CHOLECYSTECTOMY WITH INTRAOPERATIVE CHOLANGIOGRAM;  Surgeon: TErroll Luna MD;  [place] [facility]  Service: General;  Laterality: N/A;  . COLONOSCOPY  11/02/2007  . MAXILLECTOMY Right 2003   SCCA alveolar ridge    No Known Allergies  Outpatient Encounter Medications as of 05/02/2018    Medication Sig  . ACCU-CHEK SOFTCLIX LANCETS lancets 100 each by Other route 3 (three) times daily. Use as instructed Dx:E11.22  . amLODipine (NORVASC) 2.5 MG tablet TAKE 1 TABLET BY MOUTH EVERY DAY  . Blood Glucose Monitoring Suppl (ACCU-CHEK AVIVA PLUS) w/Device KIT 1 Units by Does not apply route as directed. Use as directed to check blood sugar. Dx:E11.22  . EASY COMFORT PEN NEEDLES 31G X 5 MM MISC use as directed TO INJECT INSULIN TO CONTROL BLOOD SUGAR  . glucose blood (ACCU-CHEK AVIVA) test strip Use to test blood sugar three times daily. Dx:E11.22  . Insulin Degludec (TRESIBA) 100 UNIT/ML SOLN Inject 15 Units into the skin daily.  . Nutritional Supplements (HIGH-PROTEIN NUTRITIONAL SHAKE) LIQD Take by mouth as needed.  . [DISCONTINUED] TRADJENTA 5 MG TABS tablet TAKE 1 TABLET BY MOUTH EVERY DAY   No facility-administered encounter medications on file as of 05/02/2018.     Review of Systems:  Review of Systems  Constitutional: Positive for malaise/fatigue. Negative for chills and fever.  HENT: Positive for hearing loss.   Eyes: Positive for blurred vision.       Glasses  Respiratory: Negative for cough and shortness of breath.   Cardiovascular: Negative for chest pain, palpitations and leg swelling.  Gastrointestinal: Negative for abdominal pain and  constipation.  Genitourinary: Positive for frequency. Negative for dysuria.  Musculoskeletal: Positive for joint pain. Negative for falls.  Skin: Negative for rash.  Neurological: Positive for dizziness, tingling and sensory change. Negative for loss of consciousness.  Endo/Heme/Allergies: Bruises/bleeds easily.  Psychiatric/Behavioral: Positive for depression. Negative for memory loss.    Health Maintenance  Topic Date Due  . OPHTHALMOLOGY EXAM  01/16/1942  . DEXA SCAN  01/16/1997  . COLONOSCOPY  01/29/2016  . HEMOGLOBIN A1C  06/25/2018  . FOOT EXAM  12/25/2018  . URINE MICROALBUMIN  12/25/2018  . TETANUS/TDAP  04/16/2021   . INFLUENZA VACCINE  Completed  . PNA vac Low Risk Adult  Completed    Physical Exam: Vitals:   05/02/18 0817  BP: (!) 148/80  Pulse: (!) 54  Temp: (!) 97.5 F (36.4 C)  TempSrc: Oral  SpO2: 98%  Weight: 152 lb (68.9 kg)  Height: _0  (1.676 m)   Body mass index is 24.53 kg/m. Physical Exam Vitals signs reviewed.  Constitutional:      Appearance: Normal appearance.  HENT:     Head: Normocephalic and atraumatic.     Ears:     Comments: HOH even with hearing aids Cardiovascular:     Rate and Rhythm: Normal rate and regular rhythm.     Pulses: Normal pulses.     Heart sounds: Normal heart sounds.  Pulmonary:     Effort: Pulmonary effort is normal.     Breath sounds: Normal breath sounds.  Musculoskeletal: Normal range of motion.  Skin:    General: Skin is warm and dry.  Neurological:     General: No focal deficit present.     Mental Status: He is alert and oriented to person, place, and time.     Comments: Resting tremor  Psychiatric:        Mood and Affect: Mood normal.     Labs reviewed: Basic Metabolic Panel: Recent Labs    08/08/17 12/24/17 0953  NA 140 139  K 4.7 4.4  CL  --  104  CO2  --  26  GLUCOSE  --  133*  BUN 31* 35*  CREATININE 2.2* 1.89*  CALCIUM  --  9.7   Liver Function Tests: Recent Labs    12/24/17 0953  AST 14  ALT 9  BILITOT 0.7  PROT 7.0   No results for input(s): LIPASE, AMYLASE in the last 8760 hours. No results for input(s): AMMONIA in the last 8760 hours. CBC: Recent Labs    08/08/17 12/24/17 0953  WBC  --  9.4  NEUTROABS  --  7,078  HGB 13.9 14.4  HCT  --  41.9  MCV  --  91.1  PLT  --  198   Lipid Panel: Recent Labs    12/24/17 0953  CHOL 89  HDL 53  LDLCALC 26  TRIG 35  CHOLHDL 1.7   Lab Results  Component Value Date   HGBA1C 7.2 (H) 12/24/2017    Procedures since last visit: No results found.  Assessment/Plan 1. Chronic kidney disease, stage 4 (severe) (HCC) - following with Dr.  [name] -improve diabetic control  -Avoid nephrotoxic agents like nsaids, dose adjust renally excreted meds, hydrate. - CBC with Differential/Platelet - COMPLETE METABOLIC PANEL WITH GFR - Microalbumin / creatinine urine ratio -can only really take tradjenta and insulin due to this  2. Type 2 diabetes mellitus with stage 4 chronic kidney disease, with long-term current use of insulin (HCC) - cut down on desserts and donuts -  Hemoglobin A1c - Microalbumin / creatinine urine ratio  3. Diabetic retinopathy associated with type 2 diabetes mellitus, macular edema presence unspecified, unspecified laterality, unspecified retinopathy severity ([place]) - get copy of latest eye exam from Dr. Katy Fitch - -tradjenta samples given Hemoglobin A1c  4. Exudative age-related macular degeneration of left eye, unspecified stage ([place]) -ongoing issue, had laser txs but sounds like that was for retinas--get ophtho report to clarify  5. Major depressive disorder with single episode, in partial remission [facility]) -gets bored in [place], but wife wants to live there closer to family -doing better though lately  85. Aortic atherosclerosis ([place]) -remains present on imaging -work to reduce cholesterol and bp with low fat and low sodium diet - Lipid panel  7. Tachy-brady syndrome ([place]) -typically brady here, but not at a concerning level now--monitor  Labs/tests ordered:  Orders Placed This Encounter  Procedures  . Hemoglobin A1c  . Lipid panel  . CBC with Differential/Platelet  . COMPLETE METABOLIC PANEL WITH GFR  . Microalbumin / creatinine urine ratio    Next appt:  4 mos med mgt, come fasting for labs   Gehrig Patras L. [place] Pettengill, D.O. [facility] [address], [place] [address] Cell Phone (Mon-Fri 8am-5pm):  [phone] On Call:  [phone] & follow prompts after 5pm & weekends Office Phone:  [phone] Office Fax:  [phone]

## 2018-05-03 LAB — COMPLETE METABOLIC PANEL WITH GFR
AG Ratio: 1.4 (calc) (ref 1.0–2.5)
ALT: 11 U/L (ref 9–46)
AST: 16 U/L (ref 10–35)
Albumin: 4.1 g/dL (ref 3.6–5.1)
Alkaline phosphatase (APISO): 66 U/L (ref 35–144)
BUN/Creatinine Ratio: 11 (calc) (ref 6–22)
BUN: 23 mg/dL (ref 7–25)
CO2: 28 mmol/L (ref 20–32)
Calcium: 10 mg/dL (ref 8.6–10.3)
Chloride: 105 mmol/L (ref 98–110)
Creat: 2.16 mg/dL — ABNORMAL HIGH (ref 0.70–1.11)
GFR, Est African American: 31 mL/min/{1.73_m2} — ABNORMAL LOW (ref >=60)
GFR, Est Non African American: 27 mL/min/{1.73_m2} — ABNORMAL LOW (ref >=60)
Globulin: 3 g/dL (calc) (ref 1.9–3.7)
Glucose, Bld: 133 mg/dL — ABNORMAL HIGH (ref 65–99)
Potassium: 5.4 mmol/L — ABNORMAL HIGH (ref 3.5–5.3)
Sodium: 141 mmol/L (ref 135–146)
Total Bilirubin: 0.7 mg/dL (ref 0.2–1.2)
Total Protein: 7.1 g/dL (ref 6.1–8.1)

## 2018-05-03 LAB — CBC WITH DIFFERENTIAL/PLATELET
Absolute Monocytes: 904 cells/uL (ref 200–950)
Basophils Absolute: 80 cells/uL (ref 0–200)
Basophils Relative: 1 %
Eosinophils Absolute: 104 cells/uL (ref 15–500)
Eosinophils Relative: 1.3 %
HCT: 42.4 % (ref 38.5–50.0)
Hemoglobin: 14.7 g/dL (ref 13.2–17.1)
Lymphs Abs: 1312 cells/uL (ref 850–3900)
MCH: 32 pg (ref 27.0–33.0)
MCHC: 34.7 g/dL (ref 32.0–36.0)
MCV: 92.4 fL (ref 80.0–100.0)
MPV: 10.4 fL (ref 7.5–12.5)
Monocytes Relative: 11.3 %
Neutro Abs: 5600 cells/uL (ref 1500–7800)
Neutrophils Relative %: 70 %
Platelets: 223 10*3/uL (ref 140–400)
RBC: 4.59 10*6/uL (ref 4.20–5.80)
RDW: 12.1 % (ref 11.0–15.0)
Total Lymphocyte: 16.4 %
WBC: 8 10*3/uL (ref 3.8–10.8)

## 2018-05-03 LAB — LIPID PANEL
Cholesterol: 95 mg/dL (ref <200)
HDL: 53 mg/dL (ref >=40)
LDL Cholesterol (Calc): 30 mg/dL (calc)
Non-HDL Cholesterol (Calc): 42 mg/dL (calc) (ref <130)
Total CHOL/HDL Ratio: 1.8 (calc) (ref <5.0)
Triglycerides: 41 mg/dL (ref <150)

## 2018-05-03 LAB — HEMOGLOBIN A1C
Hgb A1c MFr Bld: 7.7 % of total Hgb — ABNORMAL HIGH (ref <5.7)
Mean Plasma Glucose: 174 (calc)
eAG (mmol/L): 9.7 (calc)

## 2018-05-03 LAB — MICROALBUMIN / CREATININE URINE RATIO
Creatinine, Urine: 31 mg/dL (ref 20–320)
Microalb Creat Ratio: 45 mcg/mg creat — ABNORMAL HIGH (ref <30)
Microalb, Ur: 1.4 mg/dL

## 2018-05-06 ENCOUNTER — Encounter: Payer: Self-pay | Admitting: *Deleted

## 2018-05-21 ENCOUNTER — Other Ambulatory Visit: Payer: Self-pay | Admitting: *Deleted

## 2018-05-21 MED ORDER — INSULIN DEGLUDEC 100 UNIT/ML ~~LOC~~ SOLN: 15.0000 [IU] | Freq: Every day | SUBCUTANEOUS | 6 refills | Status: DC

## 2018-05-21 NOTE — Telephone Encounter (Signed)
Patient requested refill

## 2018-05-31 ENCOUNTER — Other Ambulatory Visit: Payer: Self-pay | Admitting: *Deleted

## 2018-05-31 DIAGNOSIS — R809 Proteinuria, unspecified: Principal | ICD-10-CM

## 2018-05-31 DIAGNOSIS — E1129 Type 2 diabetes mellitus with other diabetic kidney complication: Secondary | ICD-10-CM

## 2018-05-31 MED ORDER — LINAGLIPTIN 5 MG PO TABS: 5.0000 mg | ORAL_TABLET | Freq: Every day | ORAL | 1 refills | Status: DC

## 2018-05-31 NOTE — Telephone Encounter (Signed)
Medication pended ok to send?

## 2018-05-31 NOTE — Telephone Encounter (Signed)
I approved it.  It was a cost concern last time and we were going to find out if his pharmacy had told him why it was expensive now vs before.  I guess we'll see what happens this time.  Perhaps it was a deductible issue.

## 2018-05-31 NOTE — Telephone Encounter (Signed)
Patient called and stated that you had given him Tradjenta 5mg  samples at last OV. Patient tolerated well. Can this be added to his current medication list and sent to pharmacy. Please Advise.

## 2018-06-30 ENCOUNTER — Encounter: Payer: Self-pay | Admitting: Internal Medicine

## 2018-08-14 ENCOUNTER — Other Ambulatory Visit: Payer: Self-pay | Admitting: Internal Medicine

## 2018-09-16 ENCOUNTER — Ambulatory Visit (INDEPENDENT_AMBULATORY_CARE_PROVIDER_SITE_OTHER): Payer: Medicare PPO | Admitting: Internal Medicine

## 2018-09-16 ENCOUNTER — Encounter: Payer: Self-pay | Admitting: Internal Medicine

## 2018-09-16 ENCOUNTER — Other Ambulatory Visit: Payer: Self-pay

## 2018-09-16 VITALS — BP 140/90 | HR 48 | Temp 97.5°F | Ht 66.0 in | Wt 152.0 lb

## 2018-09-16 DIAGNOSIS — R809 Proteinuria, unspecified: Secondary | ICD-10-CM

## 2018-09-16 DIAGNOSIS — N184 Chronic kidney disease, stage 4 (severe): Secondary | ICD-10-CM

## 2018-09-16 DIAGNOSIS — M545 Low back pain, unspecified: Secondary | ICD-10-CM

## 2018-09-16 DIAGNOSIS — G8929 Other chronic pain: Secondary | ICD-10-CM

## 2018-09-16 DIAGNOSIS — E1122 Type 2 diabetes mellitus with diabetic chronic kidney disease: Secondary | ICD-10-CM

## 2018-09-16 DIAGNOSIS — E1169 Type 2 diabetes mellitus with other specified complication: Secondary | ICD-10-CM

## 2018-09-16 DIAGNOSIS — N3281 Overactive bladder: Secondary | ICD-10-CM | POA: Diagnosis not present

## 2018-09-16 DIAGNOSIS — E1129 Type 2 diabetes mellitus with other diabetic kidney complication: Secondary | ICD-10-CM

## 2018-09-16 DIAGNOSIS — Z794 Long term (current) use of insulin: Secondary | ICD-10-CM

## 2018-09-16 DIAGNOSIS — E785 Hyperlipidemia, unspecified: Secondary | ICD-10-CM

## 2018-09-16 MED ORDER — BASAGLAR KWIKPEN 100 UNIT/ML ~~LOC~~ SOPN: 15.0000 [IU] | PEN_INJECTOR | Freq: Every day | SUBCUTANEOUS | 0 refills | Status: DC

## 2018-09-16 MED ORDER — MIRABEGRON ER 25 MG PO TB24: 25.0000 mg | ORAL_TABLET | Freq: Every day | ORAL | 3 refills | Status: DC

## 2018-09-16 MED ORDER — LINAGLIPTIN 5 MG PO TABS: 5.0000 mg | ORAL_TABLET | Freq: Every day | ORAL | 1 refills | Status: DC

## 2018-09-16 NOTE — Patient Instructions (Signed)
Try to walk once around three times a week with [place].  Also, you may get a brace from the pharmacy (velcro black lumbar support brace).  Use this only when you are active mowing or walking.    Switch your insulin to basaglar--three pens provided.  Take just like tresiba.    Try myrbetriq 25mg  daily for your bladder control.  Sent to [facility].

## 2018-09-16 NOTE — Progress Notes (Signed)
Location:  [facility]  Provider:   Code Status:  Goals of Care:  Advanced Directives 12/27/2017  Does Patient Have a Medical Advance Directive? Yes  Type of Advance Directive Living will  Does patient want to make changes to medical advance directive? No - Patient declined  Copy of [place] in Chart? -  Would patient like information on creating a medical advance directive? No - Patient declined  Pre-existing out of facility DNR order (yellow form or pink MOST form) -     Chief Complaint  Patient presents with  . Medical Management of Chronic Issues    [facility] follow-up    HPI: Patient is a 85y.o. male seen today for medical management of chronic diseases.    He states he is checking is sugar daily and the average is around 105 in the morning. The cost of TTyler Aasis a main concern today. Patient is asking to try a cheaper medication or receive sample. He tries to follow a low sugar diet.   Patient states they are going to see Dr. GKaty Fitchthis month to check eye vision.   He complains of dizziness, back pain and urinary urgency. Dizziness is occurring when the patient is outside in hot temperatures. He is not drinking enough water during the day, only 20-30oz/day.   Back pain occurs when the patient is active. He takes tylenol in the morning to help.   Urinary urgency occurring during night a few times a week.   Patient states they are staying home and wearing mask when not home.       Past Medical History:  Diagnosis Date  . Chronic kidney disease (CKD), stage III (moderate) (HCC)   . Diabetes mellitus   . Diverticulosis   . Hard of hearing   . Hemorrhoids   . Hx of adenomatous colonic polyps 2006  . Hypertension   . Squamous cell carcinoma of hard palate ([facility] 2003    Past Surgical History:  Procedure Laterality Date  . CHOLECYSTECTOMY N/A 04/17/2014   Procedure: LAPAROSCOPIC CHOLECYSTECTOMY WITH INTRAOPERATIVE CHOLANGIOGRAM;  Surgeon: TErroll Luna MD;  Location: [facility]  Service: General;  Laterality: N/A;  . COLONOSCOPY  11/02/2007  . MAXILLECTOMY Right 2003   SCCA alveolar ridge    No Known Allergies  Outpatient Encounter Medications as of 09/16/2018  Medication Sig  . ACCU-CHEK SOFTCLIX LANCETS lancets 100 each by Other route 3 (three) times daily. Use as instructed Dx:E11.22  . amLODipine (NORVASC) 2.5 MG tablet TAKE 1 TABLET BY MOUTH EVERY DAY  . Blood Glucose Monitoring Suppl (ACCU-CHEK AVIVA PLUS) w/Device KIT 1 Units by Does not apply route as directed. Use as directed to check blood sugar. Dx:E11.22  . EASY COMFORT PEN NEEDLES 31G X 5 MM MISC use as directed TO INJECT INSULIN TO CONTROL BLOOD SUGAR  . glucose blood (ACCU-CHEK AVIVA) test strip Use to test blood sugar three times daily. Dx:E11.22  . linagliptin (TRADJENTA) 5 MG TABS tablet Take 1 tablet (5 mg total) by mouth daily.  . Nutritional Supplements (HIGH-PROTEIN NUTRITIONAL SHAKE) LIQD Take by mouth as needed.  . TRESIBA FLEXTOUCH 100 UNIT/ML SOPN FlexTouch Pen INJECT 15 UNITS INTO SKIN DAILY   No facility-administered encounter medications on file as of 09/16/2018.     Review of Systems:  Review of Systems  Constitutional: Negative.   HENT: Negative.   Eyes: Negative.   Respiratory: Negative for chest tightness, shortness of breath and wheezing.   Cardiovascular: Negative for chest  pain, palpitations and leg swelling.  Endocrine: Negative.   Genitourinary: Positive for urgency. Negative for difficulty urinating, dysuria and hematuria.  Musculoskeletal: Positive for back pain.  Neurological: Positive for dizziness and light-headedness. Negative for syncope and numbness.    Health Maintenance  Topic Date Due  . INFLUENZA VACCINE  10/05/2018  . HEMOGLOBIN A1C  10/31/2018  . FOOT EXAM  12/25/2018  . OPHTHALMOLOGY EXAM  03/06/2019  . URINE MICROALBUMIN  05/03/2019  . TETANUS/TDAP  04/16/2021  . PNA vac Low Risk Adult  Completed    Physical Exam:  Vitals:   09/16/18 0910  BP: 140/90  Pulse: (!) 48  Temp: (!) 97.5 F (36.4 C)  TempSrc: Oral  SpO2: 99%  Weight: 152 lb (68.9 kg)  Height: 5' 6"  (1.676 m)   Body mass index is 24.53 kg/m. Physical Exam Constitutional:      General: He is not in acute distress.    Appearance: Normal appearance. He is not ill-appearing.  HENT:     Head: Normocephalic.  Cardiovascular:     Rate and Rhythm: Normal rate and regular rhythm.     Pulses: Normal pulses.     Heart sounds: Normal heart sounds. No murmur.  Pulmonary:     Effort: Pulmonary effort is normal.     Breath sounds: Normal breath sounds.  Abdominal:     General: Abdomen is flat. Bowel sounds are normal.     Palpations: Abdomen is soft.  Musculoskeletal:     Right lower leg: No edema.     Left lower leg: No edema.  Skin:    General: Skin is warm and dry.  Neurological:     General: No focal deficit present.     Mental Status: He is alert and oriented to person, place, and time.  Psychiatric:        Mood and Affect: Mood normal.     Labs reviewed: Basic Metabolic Panel: Recent Labs    12/24/17 0953 05/02/18 0906  NA 139 141  K 4.4 5.4*  CL 104 105  CO2 26 28  GLUCOSE 133* 133*  BUN 35* 23  CREATININE 1.89* 2.16*  CALCIUM 9.7 10.0   Liver Function Tests: Recent Labs    12/24/17 0953 05/02/18 0906  AST 14 16  ALT 9 11  BILITOT 0.7 0.7  PROT 7.0 7.1   No results for input(s): LIPASE, AMYLASE in the last 8760 hours. No results for input(s): AMMONIA in the last 8760 hours. CBC: Recent Labs    12/24/17 0953 05/02/18 0906  WBC 9.4 8.0  NEUTROABS 7,078 5,600  HGB 14.4 14.7  HCT 41.9 42.4  MCV 91.1 92.4  PLT 198 223   Lipid Panel: Recent Labs    12/24/17 0953 05/02/18 0906  CHOL 89 95  HDL 53 53  LDLCALC 26 30  TRIG 35 41  CHOLHDL 1.7 1.8   Lab Results  Component Value Date   HGBA1C 7.7 (H) 05/02/2018    Procedures since last visit: No results found.  Assessment/Plan 1. Chronic  Kidney Disease, stage 4 - followed by Dr. [name] - improve glycemic control - Avoid nephrotoxic medications like NSAIDS and encourage  hydration -CBC with diff panel - CMP with GFR  2. Type 2 diabetes mellitus with stage 4 chronic kidney disease, long-term current use of insulin - reduce intake of sugary desserts -Hgb A1C  3. Diabetic retinopathy associated with diabetes mellitus, macular edema presence unspecified, unspecified laterality, unspecified retinopathy ([place]) - followed by Dr. Katy Fitch- appointment  scheduled later this month - continue tradgenta Horticulturist, commercial daily injections  4. Macular degeneration, left eye, unspecified stage - ongoing issues followed by Dr. Katy Fitch  5. Major depressive disorder with single episode, partial remission - patient practicing social distancing, but does not complain of depression, just boredom  6. Aortic atherosclerosis - lipid panel - encourage diet low in fat and sodium  7. Low back pain, unspecified - encourage walking three times a week - tylenol before activity or strenuous activity - back brace for additional lumbar support during activity  8. Urinary urgency - start myrbetriq 63m PO daily  Labs/tests ordered:  [place] A1C, lipid panel, CBC with diff, CMP with GFR, microalbumin  Next appt:  12/27/2018

## 2018-09-17 ENCOUNTER — Other Ambulatory Visit: Payer: Self-pay | Admitting: *Deleted

## 2018-09-17 DIAGNOSIS — N3281 Overactive bladder: Secondary | ICD-10-CM

## 2018-09-17 MED ORDER — MIRABEGRON ER 25 MG PO TB24: 25.0000 mg | ORAL_TABLET | Freq: Every day | ORAL | 3 refills | Status: DC

## 2018-09-17 NOTE — Telephone Encounter (Signed)
Patient requested Rx to be sent to [facility]

## 2018-09-27 ENCOUNTER — Other Ambulatory Visit: Payer: Self-pay | Admitting: *Deleted

## 2018-09-27 DIAGNOSIS — I10 Essential (primary) hypertension: Secondary | ICD-10-CM

## 2018-09-27 DIAGNOSIS — E1122 Type 2 diabetes mellitus with diabetic chronic kidney disease: Secondary | ICD-10-CM

## 2018-09-27 DIAGNOSIS — E1169 Type 2 diabetes mellitus with other specified complication: Secondary | ICD-10-CM

## 2018-09-27 DIAGNOSIS — N184 Chronic kidney disease, stage 4 (severe): Secondary | ICD-10-CM

## 2018-10-01 ENCOUNTER — Other Ambulatory Visit: Payer: Self-pay | Admitting: *Deleted

## 2018-10-01 ENCOUNTER — Other Ambulatory Visit: Payer: Self-pay

## 2018-10-01 ENCOUNTER — Other Ambulatory Visit: Payer: Medicare PPO

## 2018-10-01 DIAGNOSIS — E1122 Type 2 diabetes mellitus with diabetic chronic kidney disease: Secondary | ICD-10-CM

## 2018-10-01 DIAGNOSIS — E1169 Type 2 diabetes mellitus with other specified complication: Secondary | ICD-10-CM

## 2018-10-01 DIAGNOSIS — I10 Essential (primary) hypertension: Secondary | ICD-10-CM

## 2018-10-01 DIAGNOSIS — E785 Hyperlipidemia, unspecified: Secondary | ICD-10-CM

## 2018-10-01 DIAGNOSIS — N184 Chronic kidney disease, stage 4 (severe): Secondary | ICD-10-CM

## 2018-10-01 MED ORDER — ACCU-CHEK AVIVA VI STRP: ORAL_STRIP | 3 refills | Status: DC

## 2018-10-01 NOTE — Telephone Encounter (Signed)
Patient requested refill

## 2018-10-02 ENCOUNTER — Other Ambulatory Visit: Payer: Self-pay | Admitting: Internal Medicine

## 2018-10-02 DIAGNOSIS — E1122 Type 2 diabetes mellitus with diabetic chronic kidney disease: Secondary | ICD-10-CM

## 2018-10-02 DIAGNOSIS — Z7189 Other specified counseling: Secondary | ICD-10-CM

## 2018-10-02 DIAGNOSIS — N184 Chronic kidney disease, stage 4 (severe): Secondary | ICD-10-CM

## 2018-10-02 LAB — COMPLETE METABOLIC PANEL WITH GFR
AG Ratio: 1.5 (calc) (ref 1.0–2.5)
ALT: 12 U/L (ref 9–46)
AST: 15 U/L (ref 10–35)
Albumin: 4.2 g/dL (ref 3.6–5.1)
Alkaline phosphatase (APISO): 45 U/L (ref 35–144)
BUN/Creatinine Ratio: 17 (calc) (ref 6–22)
BUN: 35 mg/dL — ABNORMAL HIGH (ref 7–25)
CO2: 25 mmol/L (ref 20–32)
Calcium: 9.6 mg/dL (ref 8.6–10.3)
Chloride: 106 mmol/L (ref 98–110)
Creat: 2.1 mg/dL — ABNORMAL HIGH (ref 0.70–1.11)
GFR, Est African American: 32 mL/min/{1.73_m2} — ABNORMAL LOW (ref >=60)
GFR, Est Non African American: 28 mL/min/{1.73_m2} — ABNORMAL LOW (ref >=60)
Globulin: 2.8 g/dL (calc) (ref 1.9–3.7)
Glucose, Bld: 107 mg/dL — ABNORMAL HIGH (ref 65–99)
Potassium: 4.5 mmol/L (ref 3.5–5.3)
Sodium: 140 mmol/L (ref 135–146)
Total Bilirubin: 0.6 mg/dL (ref 0.2–1.2)
Total Protein: 7 g/dL (ref 6.1–8.1)

## 2018-10-02 LAB — CBC WITH DIFFERENTIAL/PLATELET
Absolute Monocytes: 1226 cells/uL — ABNORMAL HIGH (ref 200–950)
Basophils Absolute: 76 cells/uL (ref 0–200)
Basophils Relative: 0.8 %
Eosinophils Absolute: 171 cells/uL (ref 15–500)
Eosinophils Relative: 1.8 %
HCT: 41.6 % (ref 38.5–50.0)
Hemoglobin: 14.3 g/dL (ref 13.2–17.1)
Lymphs Abs: 1777 cells/uL (ref 850–3900)
MCH: 31.9 pg (ref 27.0–33.0)
MCHC: 34.4 g/dL (ref 32.0–36.0)
MCV: 92.9 fL (ref 80.0–100.0)
MPV: 10.4 fL (ref 7.5–12.5)
Monocytes Relative: 12.9 %
Neutro Abs: 6251 cells/uL (ref 1500–7800)
Neutrophils Relative %: 65.8 %
Platelets: 204 10*3/uL (ref 140–400)
RBC: 4.48 10*6/uL (ref 4.20–5.80)
RDW: 13 % (ref 11.0–15.0)
Total Lymphocyte: 18.7 %
WBC: 9.5 10*3/uL (ref 3.8–10.8)

## 2018-10-02 LAB — LIPID PANEL
Cholesterol: 86 mg/dL (ref <200)
HDL: 47 mg/dL (ref >=40)
LDL Cholesterol (Calc): 25 mg/dL (calc)
Non-HDL Cholesterol (Calc): 39 mg/dL (calc) (ref <130)
Total CHOL/HDL Ratio: 1.8 (calc) (ref <5.0)
Triglycerides: 49 mg/dL (ref <150)

## 2018-10-02 LAB — HEMOGLOBIN A1C
Hgb A1c MFr Bld: 7.4 % of total Hgb — ABNORMAL HIGH (ref <5.7)
Mean Plasma Glucose: 166 (calc)
eAG (mmol/L): 9.2 (calc)

## 2018-10-03 ENCOUNTER — Encounter: Payer: Self-pay | Admitting: *Deleted

## 2018-10-10 ENCOUNTER — Other Ambulatory Visit: Payer: Self-pay | Admitting: *Deleted

## 2018-10-10 DIAGNOSIS — I10 Essential (primary) hypertension: Secondary | ICD-10-CM

## 2018-10-10 MED ORDER — ACCU-CHEK AVIVA VI STRP: ORAL_STRIP | 3 refills | Status: DC

## 2018-10-10 MED ORDER — AMLODIPINE BESYLATE 2.5 MG PO TABS: 2.5000 mg | ORAL_TABLET | Freq: Every day | ORAL | 1 refills | Status: DC

## 2018-10-10 NOTE — Telephone Encounter (Signed)
Patient wife requested refills.

## 2018-11-21 ENCOUNTER — Ambulatory Visit (INDEPENDENT_AMBULATORY_CARE_PROVIDER_SITE_OTHER): Payer: Medicare PPO | Admitting: Internal Medicine

## 2018-11-21 ENCOUNTER — Encounter: Payer: Self-pay | Admitting: Internal Medicine

## 2018-11-21 ENCOUNTER — Other Ambulatory Visit: Payer: Self-pay

## 2018-11-21 VITALS — BP 132/80 | HR 54 | Temp 97.1°F | Ht 66.0 in | Wt 151.8 lb

## 2018-11-21 DIAGNOSIS — Z23 Encounter for immunization: Secondary | ICD-10-CM | POA: Diagnosis not present

## 2018-11-21 DIAGNOSIS — E1122 Type 2 diabetes mellitus with diabetic chronic kidney disease: Secondary | ICD-10-CM | POA: Diagnosis not present

## 2018-11-21 DIAGNOSIS — N184 Chronic kidney disease, stage 4 (severe): Secondary | ICD-10-CM

## 2018-11-21 DIAGNOSIS — N3281 Overactive bladder: Secondary | ICD-10-CM

## 2018-11-21 DIAGNOSIS — K219 Gastro-esophageal reflux disease without esophagitis: Secondary | ICD-10-CM

## 2018-11-21 DIAGNOSIS — R296 Repeated falls: Secondary | ICD-10-CM | POA: Diagnosis not present

## 2018-11-21 DIAGNOSIS — Z794 Long term (current) use of insulin: Secondary | ICD-10-CM

## 2018-11-21 MED ORDER — PANTOPRAZOLE SODIUM 40 MG PO TBEC: 40.0000 mg | DELAYED_RELEASE_TABLET | Freq: Two times a day (BID) | ORAL | 0 refills | Status: DC

## 2018-11-21 NOTE — Patient Instructions (Signed)
I've given you a month's worth of myrbetriq for your urinary issues. I gave you one toujeo pen to use instead of basaglar (we don't have it now). I have ordered protonix from mail order for you to take twice a day before meals for your reflux and abdominal pain.   You may use famotidine as needed until the protonix starts to work better. Please be careful walking and use your cane or walker at all times.  Try to take short walks to work on your strength.

## 2018-11-21 NOTE — Progress Notes (Signed)
Location:  [facility] Provider:  Micheil Klaus L. Mariea Clonts, D.O., C.M.D.  Code Status: DNR Goals of Care:  Advanced Directives 12/27/2017  Does Patient Have a Medical Advance Directive? Yes  Type of Advance Directive Living will  Does patient want to make changes to medical advance directive? No - Patient declined  Copy of [place] in Chart? -  Would patient like information on creating a medical advance directive? No - Patient declined  Pre-existing out of facility DNR order (yellow form or pink MOST form) -     Chief Complaint  Patient presents with  . Acute Visit    Patient complains of abdominal pain - midline  . Immunizations    Flu shot given today    HPI: Patient is a 85 y.o. male seen today for medical management of chronic diseases.    He's never had stomach trouble.  He took about 5 pills for his stomach once years ago with Dr. Hardin Negus who was here sometime.  He's had the midline abdominal pain for about a week.    Hurts a little at the moment--had only applesauce this am.  Everything is giving him heartburn--even water here lately.  He's quit pizza.  Does bother him more when he lies down--sometimes 3am before he can go to sleep due to this.  Bad regardless of position.    He has used some pepcid and had some other brand, but was afraid to take--only used 4-5. Gets constipated, but resolved with treatment.  He slid out of the car, fell right down.  Gaynell had to get the neighbor to help him get up, but he had gotten up on his own a month ago.  He felt fine right away, but then his tailbone has been hurting and he has to sit on a cushion.  He is using a cane since.  He also has a walker.    Could not get myrbetriq from his pharmacy for some reason with insurance.    He does not like staying at home all the time.  Doesn't want to do PT again.    CBGs staying 145 plus.  Last hba1c was 7.4 end of July.  Using tradjenta in the evening and taking his 15 units  basaglar.  No low sugars.    Past Medical History:  Diagnosis Date  . Chronic kidney disease (CKD), stage III (moderate) (HCC)   . Diabetes mellitus   . Diverticulosis   . Hard of hearing   . Hemorrhoids   . Hx of adenomatous colonic polyps 2006  . Hypertension   . Squamous cell carcinoma of hard palate ([facility]) 2003    Past Surgical History:  Procedure Laterality Date  . CHOLECYSTECTOMY N/A 04/17/2014   Procedure: LAPAROSCOPIC CHOLECYSTECTOMY WITH INTRAOPERATIVE CHOLANGIOGRAM;  Surgeon: Erroll Luna, MD;  Location: [facility];  Service: General;  Laterality: N/A;  . COLONOSCOPY  11/02/2007  . MAXILLECTOMY Right 2003   SCCA alveolar ridge    No Known Allergies  Outpatient Encounter Medications as of 11/21/2018  Medication Sig  . ACCU-CHEK SOFTCLIX LANCETS lancets 100 each by Other route 3 (three) times daily. Use as instructed Dx:E11.22  . amLODipine (NORVASC) 2.5 MG tablet Take 1 tablet (2.5 mg total) by mouth daily.  . Blood Glucose Monitoring Suppl (ACCU-CHEK AVIVA PLUS) w/Device KIT 1 Units by Does not apply route as directed. Use as directed to check blood sugar. Dx:E11.22  . EASY COMFORT PEN NEEDLES 31G X 5 MM MISC use as directed  TO INJECT INSULIN TO CONTROL BLOOD SUGAR  . glucose blood (ACCU-CHEK AVIVA) test strip Use to test blood sugar three times daily. Dx:E11.22  . Insulin Glargine (BASAGLAR KWIKPEN) 100 UNIT/ML SOPN Inject 0.15 mLs (15 Units total) into the skin daily.  Marland Kitchen linagliptin (TRADJENTA) 5 MG TABS tablet Take 1 tablet (5 mg total) by mouth daily.  . mirabegron ER (MYRBETRIQ) 25 MG TB24 tablet Take 1 tablet (25 mg total) by mouth daily.  . Nutritional Supplements (HIGH-PROTEIN NUTRITIONAL SHAKE) LIQD Take by mouth as needed.   No facility-administered encounter medications on file as of 11/21/2018.     Review of Systems:  Review of Systems  Constitutional: Positive for malaise/fatigue. Negative for chills, fever and weight loss.  HENT: Positive for congestion,  hearing loss and sinus pain.        Says his left sinus has bothered him some for a year--advised to use flonase otc prn  Eyes: Negative for pain.  Respiratory: Negative for cough and shortness of breath.   Cardiovascular: Negative for chest pain, palpitations and leg swelling.  Skin: Negative for itching and rash.    Health Maintenance  Topic Date Due  . INFLUENZA VACCINE  10/05/2018  . FOOT EXAM  12/25/2018  . OPHTHALMOLOGY EXAM  03/06/2019  . HEMOGLOBIN A1C  04/03/2019  . URINE MICROALBUMIN  05/03/2019  . TETANUS/TDAP  04/16/2021  . PNA vac Low Risk Adult  Completed    Physical Exam: There were no vitals filed for this visit. There is no height or weight on file to calculate BMI. Physical Exam Vitals signs reviewed.  Constitutional:      General: He is not in acute distress.    Appearance: Normal appearance. He is not toxic-appearing.  HENT:     Head: Normocephalic and atraumatic.     Ears:     Comments: Hearing loss (and wife also so very challenging visit) Cardiovascular:     Rate and Rhythm: Normal rate and regular rhythm.     Heart sounds: Murmur present.  Pulmonary:     Effort: Pulmonary effort is normal.     Breath sounds: Normal breath sounds. No rales.  Abdominal:     General: Bowel sounds are normal. There is no distension.     Palpations: Abdomen is soft. There is no mass.     Tenderness: There is abdominal tenderness. There is no guarding or rebound.     Comments: Slight RLQ tenderness (over where injects insulin)  Musculoskeletal:     Right lower leg: No edema.     Left lower leg: No edema.     Comments: Tender over right side of his sacrum (s/p fall at home)  Skin:    General: Skin is warm and dry.  Neurological:     General: No focal deficit present.     Mental Status: He is alert and oriented to person, place, and time.     Gait: Gait abnormal.  Psychiatric:        Mood and Affect: Mood normal.     Labs reviewed: Basic Metabolic Panel:  Recent Labs    12/24/17 0953 05/02/18 0906 10/01/18 0848  NA 139 141 140  K 4.4 5.4* 4.5  CL 104 105 106  CO2 26 28 25   GLUCOSE 133* 133* 107*  BUN 35* 23 35*  CREATININE 1.89* 2.16* 2.10*  CALCIUM 9.7 10.0 9.6   Liver Function Tests: Recent Labs    12/24/17 0953 05/02/18 0906 10/01/18 0848  AST 14 16 15  ALT 9 11 12   BILITOT 0.7 0.7 0.6  PROT 7.0 7.1 7.0   No results for input(s): LIPASE, AMYLASE in the last 8760 hours. No results for input(s): AMMONIA in the last 8760 hours. CBC: Recent Labs    12/24/17 0953 05/02/18 0906 10/01/18 0848  WBC 9.4 8.0 9.5  NEUTROABS 7,078 5,600 6,251  HGB 14.4 14.7 14.3  HCT 41.9 42.4 41.6  MCV 91.1 92.4 92.9  PLT 198 223 204   Lipid Panel: Recent Labs    12/24/17 0953 05/02/18 0906 10/01/18 0848  CHOL 89 95 86  HDL 53 53 47  LDLCALC 26 30 25   TRIG 35 41 49  CHOLHDL 1.7 1.8 1.8   Lab Results  Component Value Date   HGBA1C 7.4 (H) 10/01/2018    Assessment/Plan 1. Gastroesophageal reflux disease, esophagitis presence not specified -start protonix 33m twice a day before meals -may use prn famotidine if severe until the protonix becomes more effective -f/u in 4 weeks on abdominal pain--if not doing better, will refer to GI  2. Controlled type 2 diabetes mellitus with stage 4 chronic kidney disease, with long-term current use of insulin (HCC) -Avoid nephrotoxic agents like nsaids, dose adjust renally excreted meds, hydrate. -too early to recheck hba1c today so will do at visit in 4 wks -no lows -keep insulin and tradjenta the same except I had to give him a toujeo rather than basaglar pen b/c that's what we had in samples  3. Overactive bladder -pt reports never getting the myrbetriq from the pharmacy but they report filling it end of July  -a month of samples was provided today  4. Recurrent falls -suggested PT again, but he refuses -encouraged walking routine with cane or walker use at all times  5. Need for  influenza vaccination - Flu Vaccine QUAD High Dose(Fluad) given  Labs/tests ordered:  Will do routine labs at next visit Next appt:  01/23/2019  Ledarius Leeson L. Merla Sawka, D.O. [facility] [address] [place] [id number] Phone (Mon-Fri 8am-5pm):  [phone] Call:  [phone]& follow prompts after 5pm & weekends Office Phone:  [phone] Fax:  [phone]

## 2018-12-02 ENCOUNTER — Telehealth: Payer: Self-pay

## 2018-12-02 NOTE — Telephone Encounter (Signed)
The patient has 3 basaglar pens in the refrigerator with his name on them, per Dr. Mariea Clonts.  He was called 12/02/18 and will pick up 9/28 or 12/03/18.

## 2018-12-19 ENCOUNTER — Ambulatory Visit: Payer: Medicare PPO | Admitting: Family

## 2018-12-27 ENCOUNTER — Ambulatory Visit: Payer: Self-pay

## 2018-12-27 ENCOUNTER — Encounter: Payer: Self-pay | Admitting: Family

## 2018-12-27 ENCOUNTER — Ambulatory Visit (INDEPENDENT_AMBULATORY_CARE_PROVIDER_SITE_OTHER): Payer: Medicare PPO | Admitting: Family

## 2018-12-27 ENCOUNTER — Other Ambulatory Visit: Payer: Self-pay

## 2018-12-27 VITALS — BP 124/78 | HR 77 | Temp 97.7°F | Ht 66.0 in | Wt 154.8 lb

## 2018-12-27 DIAGNOSIS — Z0000 Encounter for general adult medical examination without abnormal findings: Secondary | ICD-10-CM

## 2018-12-27 NOTE — Progress Notes (Signed)
Subjective:   Wesley Garcia is a 85 y.o. male who presents for Medicare Annual/Subsequent preventive examination.  Review of Systems:  Cardiac Risk Factors include: advanced age (>73mn, >>19women);diabetes mellitus;male gender;hypertension;smoking/ tobacco exposure     Objective:    Vitals: BP 124/78   Pulse 77   Temp 97.7 F (36.5 C) (Temporal)   Ht '5\' 6"'$  (1.676 m)   Wt 154 lb 12.8 oz (70.2 kg)   SpO2 98%   BMI 24.99 kg/m   Body mass index is 24.99 kg/m.  Advanced Directives 11/21/2018 12/27/2017 08/13/2017 04/16/2017 12/26/2016 12/18/2016 08/14/2016  Does Patient Have a Medical Advance Directive? Yes Yes Yes No Yes No No  Type of Advance Directive Out of facility DNR (pink MOST or yellow form) Living will Out of facility DNR (pink MOST or yellow form) - [place] - -  Does patient want to make changes to medical advance directive? No - Patient declined No - Patient declined No - Patient declined - - - -  Copy of HPress photographerin Chart? - - - - Yes - -  Would patient like information on creating a medical advance directive? - No - Patient declined - No - Patient declined - - No - Patient declined  Pre-existing out of facility DNR order (yellow form or pink MOST form) Yellow form placed in chart (order not valid for inpatient use) - Yellow form placed in chart (order not valid for inpatient use) - - - -    Tobacco Social History   Tobacco Use  Smoking Status Former Smoker  . Types: Cigars  . Quit date: 04/07/2000  . Years since quitting: 18.7  Smokeless Tobacco Never Used     Counseling given: Not Answered   Clinical Intake:  Pre-visit preparation completed: No  Pain : No/denies pain  BMI - recorded: 24.99 Nutritional Status: BMI of 19-24  Normal Nutritional Risks: None Diabetes: Yes CBG done?: No Did pt. bring in CBG monitor from home?: No(recalls in 110's)  How often do you need to have someone help you when you  read instructions, pamphlets, or other written materials from your doctor or pharmacy?: 1 - Never What is the last grade level you completed in school?: 11 Grade/ GED  Interpreter Needed?: No  Information entered by :: Silvina Hackleman  Past Medical History:  Diagnosis Date  . Chronic kidney disease (CKD), stage III (moderate)   . Diabetes mellitus   . Diverticulosis   . Hard of hearing   . Hemorrhoids   . Hx of adenomatous colonic polyps 2006  . Hypertension   . 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 CHOLECYSTECTOMY N/A 04/17/2014   Procedure: LAPAROSCOPIC CHOLECYSTECTOMY WITH INTRAOPERATIVE CHOLANGIOGRAM;  Surgeon: TErroll Luna MD;  [place] [facility]  Service: General;  Laterality: N/A;  . COLONOSCOPY  11/02/2007  . MAXILLECTOMY Right 2003   SCCA alveolar ridge   Family History  Problem Relation Age of Onset  . Cancer Mother        ?  .Marland KitchenBone cancer Sister   . Diabetes Brother   . Kidney disease Sister   . Colon cancer Neg Hx    Social History   Socioeconomic History  . Marital status: Married    Spouse name: Not on file  . Number of children: 5  . Years of education: Not on file  . Highest education level: Not  on file  Occupational History  . Occupation: Retired  Scientific laboratory technician  . Financial resource strain: Not hard at all  . Food insecurity    Worry: Never true    Inability: Never true  . Transportation needs    Medical: No    Non-medical: No  Tobacco Use  . Smoking status: Former Smoker    Types: Cigars    Quit date: 04/07/2000    Years since quitting: 18.7  . Smokeless tobacco: Never Used  Substance and Sexual Activity  . Alcohol use: Yes    Comment: socially  . Drug use: No  . Sexual activity: Never  Lifestyle  . Physical activity    Days per week: 0 days    Minutes per session: 0 min  . Stress: Not at all  Relationships  . Social connections    Talks on phone: More than three times  a week    Gets together: More than three times a week    Attends religious service: More than 4 times per year    Active member of club or organization: No    Attends meetings of clubs or organizations: Never    Relationship status: Married  Other Topics Concern  . Not on file  Social History Narrative   Retired, married    Outpatient Encounter Medications as of 12/27/2018  Medication Sig  . ACCU-CHEK SOFTCLIX LANCETS lancets 100 each by Other route 3 (three) times daily. Use as instructed Dx:E11.22  . acetaminophen (TYLENOL) 500 MG tablet Take 500-1,000 mg by mouth 3 (three) times daily as needed for mild pain or headache. Usually takes 1 tablet, will take 2 if he has a headache.  Marland Kitchen amLODipine (NORVASC) 2.5 MG tablet Take 1 tablet (2.5 mg total) by mouth daily.  . Blood Glucose Monitoring Suppl (ACCU-CHEK AVIVA PLUS) w/Device KIT 1 Units by Does not apply route as directed. Use as directed to check blood sugar. Dx:E11.22  . EASY COMFORT PEN NEEDLES 31G X 5 MM MISC use as directed TO INJECT INSULIN TO CONTROL BLOOD SUGAR  . famotidine (PEPCID) 20 MG tablet Take 20 mg by mouth as needed for heartburn or indigestion.  Marland Kitchen glucose blood (ACCU-CHEK AVIVA) test strip Use to test blood sugar three times daily. Dx:E11.22  . Insulin Glargine (BASAGLAR KWIKPEN) 100 UNIT/ML SOPN Inject 0.15 mLs (15 Units total) into the skin daily.  Marland Kitchen linagliptin (TRADJENTA) 5 MG TABS tablet Take 1 tablet (5 mg total) by mouth daily.  . Multiple Vitamins-Minerals (OCUVITE ADULT 50+) CAPS Take 1 capsule by mouth daily.  . Nutritional Supplements (HIGH-PROTEIN NUTRITIONAL SHAKE) LIQD Take by mouth as needed.  . pantoprazole (PROTONIX) 40 MG tablet Take 40 mg by mouth daily.  . [DISCONTINUED] mirabegron ER (MYRBETRIQ) 25 MG TB24 tablet Take 1 tablet (25 mg total) by mouth daily. (Patient not taking: Reported on 11/21/2018)  . [DISCONTINUED] pantoprazole (PROTONIX) 40 MG tablet Take 1 tablet (40 mg total) by mouth 2 (two)  times daily before a meal.   No facility-administered encounter medications on file as of 12/27/2018.     Activities of Daily Living In your present state of health, do you have any difficulty performing the following activities: 12/27/2018  Hearing? Y  Vision? N  Difficulty concentrating or making decisions? N  Walking or climbing stairs? Y  Comment uses a walker  Dressing or bathing? N  Doing errands, shopping? Y  Comment needs assistance with driving  Preparing Food and eating ? Y  Using the Toilet? N  In the past six months, have you accidently leaked urine? Y  Do you have problems with loss of bowel control? N  Managing your Medications? N  Housekeeping or managing your Housekeeping? Y  Comment wife assist  Some recent data might be hidden    Patient Care Team: Gayland Curry, DO as PCP - General (Geriatric Medicine) Clent Jacks, MD as Consulting Physician (Ophthalmology)   Assessment:   This is a routine wellness examination for Wesley Garcia.  Exercise Activities and Dietary recommendations Current Exercise Habits: Home exercise routine, Type of exercise: walking, Time (Minutes): 15, Frequency (Times/Week): 3, Weekly Exercise (Minutes/Week): 45, Intensity: Mild, Exercise limited by: None identified  Goals   None     Fall Risk Fall Risk  12/27/2018 09/16/2018 05/02/2018 12/24/2017 12/24/2017  Falls in the past year? 1 0 0 Yes Yes  Number falls in past yr: 1 0 0 2 or more 2 or more  Injury with Fall? 0 0 0 Yes Yes  Comment - - - - -  Risk for fall due to : - - History of fall(s);Impaired balance/gait;Medication side effect;Impaired vision - -  Follow up - - Falls evaluation completed;Education provided - -  Comment - - refuses PT--says didn't help - -   Is the patient's home free of loose throw rugs in walkways, pet beds, electrical cords, etc?   yes      Grab bars in the bathroom? yes      Handrails on the stairs?   yes      Adequate lighting?   yes  Depression  Screen PHQ 2/9 Scores 12/27/2018 09/16/2018 05/02/2018 12/24/2017  PHQ - 2 Score 0 0 0 1    Cognitive Function MMSE - Mini Mental State Exam 12/27/2018 12/24/2017  Orientation to time 5 5  Orientation to Place 5 5  Registration 3 3  Attention/ Calculation 0 5  Recall 2 3  Language- name 2 objects 2 2  Language- repeat 1 1  Language- follow 3 step command 3 3  Language- read & follow direction 1 1  Write a sentence 1 1  Copy design 1 0  Total score 24 29    Immunization History  Administered Date(s) Administered  . Fluad Quad(high Dose 65+) 11/21/2018  . Influenza, High Dose Seasonal PF 12/11/2016, 12/24/2017  . Influenza,inj,Quad PF,6+ Mos 12/16/2012, 12/04/2013  . Influenza-Unspecified 12/05/2015  . Pneumococcal Conjugate-13 03/06/2010, 09/01/2013  . Pneumococcal Polysaccharide-23 08/14/2016  . Tdap 04/17/2011  . Zoster 08/16/2010  . Zoster Recombinat (Shingrix) 12/24/2017    Qualifies for Shingles Vaccine? Up to date   Screening Tests Health Maintenance  Topic Date Due  . FOOT EXAM  12/25/2018  . OPHTHALMOLOGY EXAM  03/06/2019  . HEMOGLOBIN A1C  04/03/2019  . URINE MICROALBUMIN  05/03/2019  . TETANUS/TDAP  04/16/2021  . INFLUENZA VACCINE  Completed  . PNA vac Low Risk Adult  Completed   Cancer Screenings: Lung: Low Dose CT Chest recommended if Age 35-80 years, 30 pack-year currently smoking OR have quit w/in 15years. Patient does not qualify. Colorectal:Age out   Additional Screenings: Hepatitis C Screening:Low risk       Plan:   Due for Annual foot exam declined referral to a Podiatrist   I have personally reviewed and noted the following in the patient's chart:   . Medical and social history . Use of alcohol, tobacco or illicit drugs  . Current medications and supplements . Functional ability and status . Nutritional status . Physical activity . Advanced directives .  List of other physicians . Hospitalizations, surgeries, and ER visits in  previous 12 months . Vitals . Screenings to include cognitive, depression, and falls . Referrals and appointments  In addition, I have reviewed and discussed with patient certain preventive protocols, quality metrics, and best practice recommendations. A written personalized care plan for preventive services as well as general preventive health recommendations were provided to patient.  Sandrea Hughs, NP  12/27/2018

## 2018-12-27 NOTE — Patient Instructions (Signed)
Wesley Garcia , Thank you for taking time to come for your Medicare Wellness Visit. I appreciate your ongoing commitment to your health goals. Please review the following plan we discussed and let me know if I can assist you in the future.   Screening recommendations/referrals: Colonoscopy: Age out  Recommended yearly ophthalmology/optometry visit for glaucoma screening and checkup Recommended yearly dental visit for hygiene and checkup  Vaccinations: Influenza vaccine: : Up to date Pneumococcal vaccine : Up to date  Tdap vaccine : Up to date Shingles vaccine : Up to date   Advanced directives: Yes   Conditions/risks identified: Advance age male > 34 yrs,male Gender,Hypertension,Hx of smoking   Next appointment: 1 year   Preventive Care 77 Years and Older, Male Preventive care refers to lifestyle choices and visits with your health care provider that can promote health and wellness. What does preventive care include?  A yearly physical exam. This is also called an annual well check.  Dental exams once or twice a year.  Routine eye exams. Ask your health care provider how often you should have your eyes checked.  Personal lifestyle choices, including:  Daily care of your teeth and gums.  Regular physical activity.  Eating a healthy diet.  Avoiding tobacco and drug use.  Limiting alcohol use.  Practicing safe sex.  Taking low doses of aspirin every day.  Taking vitamin and mineral supplements as recommended by your health care provider. What happens during an annual well check? The services and screenings done by your health care provider during your annual well check will depend on your age, overall health, lifestyle risk factors, and family history of disease. Counseling  Your health care provider may ask you questions about your:  Alcohol use.  Tobacco use.  Drug use.  Emotional well-being.  Home and relationship well-being.  Sexual activity.  Eating  habits.  History of falls.  Memory and ability to understand (cognition).  Work and work Statistician. Screening  You may have the following tests or measurements:  Height, weight, and BMI.  Blood pressure.  Lipid and cholesterol levels. These may be checked every 5 years, or more frequently if you are over 86 years old.  Skin check.  Lung cancer screening. You may have this screening every year starting at age 75 if you have a 30-pack-year history of smoking and currently smoke or have quit within the past 15 years.  Fecal occult blood test (FOBT) of the stool. You may have this test every year starting at age 54.  Flexible sigmoidoscopy or colonoscopy. You may have a sigmoidoscopy every 5 years or a colonoscopy every 10 years starting at age 86.  Prostate cancer screening. Recommendations will vary depending on your family history and other risks.  Hepatitis C blood test.  Hepatitis B blood test.  Sexually transmitted disease (STD) testing.  Diabetes screening. This is done by checking your blood sugar (glucose) after you have not eaten for a while (fasting). You may have this done every 1-3 years.  Abdominal aortic aneurysm (AAA) screening. You may need this if you are a current or former smoker.  Osteoporosis. You may be screened starting at age 27 if you are at high risk. Talk with your health care provider about your test results, treatment options, and if necessary, the need for more tests. Vaccines  Your health care provider may recommend certain vaccines, such as:  Influenza vaccine. This is recommended every year.  Tetanus, diphtheria, and acellular pertussis (Tdap, Td) vaccine. You  may need a Td booster every 10 years.  Zoster vaccine. You may need this after age 30.  Pneumococcal 13-valent conjugate (PCV13) vaccine. One dose is recommended after age 11.  Pneumococcal polysaccharide (PPSV23) vaccine. One dose is recommended after age 10. Talk to your health  care provider about which screenings and vaccines you need and how often you need them. This information is not intended to replace advice given to you by your health care provider. Make sure you discuss any questions you have with your health care provider. Document Released: 03/19/2015 Document Revised: 11/10/2015 Document Reviewed: 12/22/2014 Elsevier Interactive Patient Education  2017 [place] Prevention in the Home Falls can cause injuries. They can happen to people of all ages. There are many things you can do to make your home safe and to help prevent falls. What can I do on the outside of my home?  Regularly fix the edges of walkways and driveways and fix any cracks.  Remove anything that might make you trip as you walk through a door, such as a raised step or threshold.  Trim any bushes or trees on the path to your home.  Use bright outdoor lighting.  Clear any walking paths of anything that might make someone trip, such as rocks or tools.  Regularly check to see if handrails are loose or broken. Make sure that both sides of any steps have handrails.  Any raised decks and porches should have guardrails on the edges.  Have any leaves, snow, or ice cleared regularly.  Use sand or salt on walking paths during winter.  Clean up any spills in your garage right away. This includes oil or grease spills. What can I do in the bathroom?  Use night lights.  Install grab bars by the toilet and in the tub and shower. Do not use towel bars as grab bars.  Use non-skid mats or decals in the tub or shower.  If you need to sit down in the shower, use a plastic, non-slip stool.  Keep the floor dry. Clean up any water that spills on the floor as soon as it happens.  Remove soap buildup in the tub or shower regularly.  Attach bath mats securely with double-sided non-slip rug tape.  Do not have throw rugs and other things on the floor that can make you trip. What can I do  in the bedroom?  Use night lights.  Make sure that you have a light by your bed that is easy to reach.  Do not use any sheets or blankets that are too big for your bed. They should not hang down onto the floor.  Have a firm chair that has side arms. You can use this for support while you get dressed.  Do not have throw rugs and other things on the floor that can make you trip. What can I do in the kitchen?  Clean up any spills right away.  Avoid walking on wet floors.  Keep items that you use a lot in easy-to-reach places.  If you need to reach something above you, use a strong step stool that has a grab bar.  Keep electrical cords out of the way.  Do not use floor polish or wax that makes floors slippery. If you must use wax, use non-skid floor wax.  Do not have throw rugs and other things on the floor that can make you trip. What can I do with my stairs?  Do not leave any items  on the stairs.  Make sure that there are handrails on both sides of the stairs and use them. Fix handrails that are broken or loose. Make sure that handrails are as long as the stairways.  Check any carpeting to make sure that it is firmly attached to the stairs. Fix any carpet that is loose or worn.  Avoid having throw rugs at the top or bottom of the stairs. If you do have throw rugs, attach them to the floor with carpet tape.  Make sure that you have a light switch at the top of the stairs and the bottom of the stairs. If you do not have them, ask someone to add them for you. What else can I do to help prevent falls?  Wear shoes that:  Do not have high heels.  Have rubber bottoms.  Are comfortable and fit you well.  Are closed at the toe. Do not wear sandals.  If you use a stepladder:  Make sure that it is fully opened. Do not climb a closed stepladder.  Make sure that both sides of the stepladder are locked into place.  Ask someone to hold it for you, if possible.  Clearly mark  and make sure that you can see:  Any grab bars or handrails.  First and last steps.  Where the edge of each step is.  Use tools that help you move around (mobility aids) if they are needed. These include:  Canes.  Walkers.  Scooters.  Crutches.  Turn on the lights when you go into a dark area. Replace any light bulbs as soon as they burn out.  Set up your furniture so you have a clear path. Avoid moving your furniture around.  If any of your floors are uneven, fix them.  If there are any pets around you, be aware of where they are.  Review your medicines with your doctor. Some medicines can make you feel dizzy. This can increase your chance of falling. Ask your doctor what other things that you can do to help prevent falls. This information is not intended to replace advice given to you by your health care provider. Make sure you discuss any questions you have with your health care provider. Document Released: 12/17/2008 Document Revised: 07/29/2015 Document Reviewed: 03/27/2014 Elsevier Interactive Patient Education  2017 [employer].

## 2019-01-23 ENCOUNTER — Encounter: Payer: Self-pay | Admitting: Internal Medicine

## 2019-01-23 ENCOUNTER — Ambulatory Visit (INDEPENDENT_AMBULATORY_CARE_PROVIDER_SITE_OTHER): Payer: Medicare PPO | Admitting: Internal Medicine

## 2019-01-23 ENCOUNTER — Other Ambulatory Visit: Payer: Self-pay

## 2019-01-23 VITALS — BP 122/78 | HR 61 | Temp 97.5°F | Ht 66.0 in | Wt 157.8 lb

## 2019-01-23 DIAGNOSIS — I10 Essential (primary) hypertension: Secondary | ICD-10-CM | POA: Diagnosis not present

## 2019-01-23 DIAGNOSIS — N184 Chronic kidney disease, stage 4 (severe): Secondary | ICD-10-CM | POA: Diagnosis not present

## 2019-01-23 DIAGNOSIS — R2689 Other abnormalities of gait and mobility: Secondary | ICD-10-CM

## 2019-01-23 DIAGNOSIS — E1129 Type 2 diabetes mellitus with other diabetic kidney complication: Secondary | ICD-10-CM | POA: Diagnosis not present

## 2019-01-23 DIAGNOSIS — R809 Proteinuria, unspecified: Secondary | ICD-10-CM

## 2019-01-23 MED ORDER — AMLODIPINE BESYLATE 2.5 MG PO TABS: 2.5000 mg | ORAL_TABLET | Freq: Every day | ORAL | 1 refills | Status: DC

## 2019-01-23 MED ORDER — TOUJEO MAX SOLOSTAR 300 UNIT/ML ~~LOC~~ SOPN: 15.0000 [IU] | PEN_INJECTOR | SUBCUTANEOUS | 0 refills | Status: DC

## 2019-01-23 MED ORDER — LINAGLIPTIN 5 MG PO TABS: 5.0000 mg | ORAL_TABLET | Freq: Every day | ORAL | 1 refills | Status: DC

## 2019-01-23 NOTE — Progress Notes (Signed)
Location:  [facility] clinic Provider:  Mora Pedraza L. Mariea Clonts, D.O., C.M.D.  Code Status: DNR Goals of Care:  Advanced Directives 01/23/2019  Does Patient Have a Medical Advance Directive? Yes  Type of Advance Directive Out of facility DNR (pink MOST or yellow form)  Does patient want to make changes to medical advance directive? No - Patient declined  Copy of [place] in Chart? -  Would patient like information on creating a medical advance directive? -  Pre-existing out of facility DNR order (yellow form or pink MOST form) -     Chief Complaint  Patient presents with  . Medical Management of Chronic Issues    60-monthfollowup    HPI: Patient is a 85y.o. male seen today for medical management of chronic diseases.    [facility] and labs there were reviewed and stable.  He has been using 20 units rather than 15units of basaglar to keep his glucose down.  He does have an appt for his eye exam.  Foot exam was done today.  He remains on tradjenta, as well.   No reported lows.  No hba1c on those labs there so will need at next visit.    He still is unsteady, but somehow better lately.  He's not sure why, but his wife does report he's been exercising more--walking which can certainly be helping and was encouraged today.  He denied pain.    He feels well and wanted to talk about personal things today rather than his health.  Past Medical History:  Diagnosis Date  . Chronic kidney disease (CKD), stage III (moderate)   . Diabetes mellitus   . Diverticulosis   . Hard of hearing   . Hemorrhoids   . Hx of adenomatous colonic polyps 2006  . Hypertension   . Squamous cell carcinoma of hard palate ([place] 2003    Past Surgical History:  Procedure Laterality Date  . CHOLECYSTECTOMY N/A 04/17/2014   Procedure: LAPAROSCOPIC CHOLECYSTECTOMY WITH INTRAOPERATIVE CHOLANGIOGRAM;  Surgeon: TErroll Luna MD;  [place] [facility]  Service: General;  Laterality: N/A;  .  COLONOSCOPY  11/02/2007  . MAXILLECTOMY Right 2003   SCCA alveolar ridge    No Known Allergies  Outpatient Encounter Medications as of 01/23/2019  Medication Sig  . ACCU-CHEK SOFTCLIX LANCETS lancets 100 each by Other route 3 (three) times daily. Use as instructed Dx:E11.22  . acetaminophen (TYLENOL) 500 MG tablet Take 500-1,000 mg by mouth 3 (three) times daily as needed for mild pain or headache. Usually takes 1 tablet, will take 2 if he has a headache.  .Marland KitchenamLODipine (NORVASC) 2.5 MG tablet Take 1 tablet (2.5 mg total) by mouth daily.  . Blood Glucose Monitoring Suppl (ACCU-CHEK AVIVA PLUS) w/Device KIT 1 Units by Does not apply route as directed. Use as directed to check blood sugar. Dx:E11.22  . EASY COMFORT PEN NEEDLES 31G X 5 MM MISC use as directed TO INJECT INSULIN TO CONTROL BLOOD SUGAR  . famotidine (PEPCID) 20 MG tablet Take 20 mg by mouth as needed for heartburn or indigestion.  .Marland Kitchenglucose blood (ACCU-CHEK AVIVA) test strip Use to test blood sugar three times daily. Dx:E11.22  . Insulin Glargine, 2 Unit Dial, (TOUJEO MAX SOLOSTAR) 300 UNIT/ML SOPN Inject 15 Units into the skin every morning.  . linagliptin (TRADJENTA) 5 MG TABS tablet Take 1 tablet (5 mg total) by mouth daily.  . Multiple Vitamins-Minerals (OCUVITE ADULT 50+) CAPS Take 1 capsule by mouth daily.  .Marland Kitchen  Nutritional Supplements (HIGH-PROTEIN NUTRITIONAL SHAKE) LIQD Take by mouth as needed.  . [DISCONTINUED] amLODipine (NORVASC) 2.5 MG tablet Take 1 tablet (2.5 mg total) by mouth daily.  . [DISCONTINUED] Insulin Glargine, 2 Unit Dial, (TOUJEO MAX SOLOSTAR) 300 UNIT/ML SOPN Inject 15 Units into the skin every morning.  . [DISCONTINUED] linagliptin (TRADJENTA) 5 MG TABS tablet Take 1 tablet (5 mg total) by mouth daily.  . [DISCONTINUED] Insulin Glargine (BASAGLAR KWIKPEN) 100 UNIT/ML SOPN Inject 0.15 mLs (15 Units total) into the skin daily.  . [DISCONTINUED] Insulin Glargine (BASAGLAR KWIKPEN) 100 UNIT/ML SOPN Inject 20  Units into the skin daily.  . [DISCONTINUED] pantoprazole (PROTONIX) 40 MG tablet Take 40 mg by mouth daily.   No facility-administered encounter medications on file as of 01/23/2019.     Review of Systems:  Review of Systems  Constitutional: Negative for chills, fever and malaise/fatigue.  HENT: Positive for hearing loss. Negative for congestion.   Eyes: Positive for blurred vision (glasses).  Respiratory: Negative for cough and shortness of breath.   Cardiovascular: Negative for chest pain, palpitations and leg swelling.  Gastrointestinal: Negative for abdominal pain, blood in stool, constipation, diarrhea and melena.  Genitourinary: Negative for dysuria.  Musculoskeletal: Positive for back pain. Negative for falls and joint pain.  Skin: Negative for itching and rash.  Neurological: Positive for tingling and sensory change. Negative for dizziness and loss of consciousness.  Endo/Heme/Allergies: Bruises/bleeds easily.  Psychiatric/Behavioral: Positive for memory loss. Negative for depression. The patient is not nervous/anxious and does not have insomnia.        Much more alert today and conversive than last several visits    Health Maintenance  Topic Date Due  . FOOT EXAM  12/24/2019 (Originally 12/25/2018)  . OPHTHALMOLOGY EXAM  03/06/2019  . HEMOGLOBIN A1C  04/03/2019  . URINE MICROALBUMIN  05/03/2019  . TETANUS/TDAP  04/16/2021  . INFLUENZA VACCINE  Completed  . PNA vac Low Risk Adult  Completed    Physical Exam: Vitals:   01/23/19 1023  BP: 122/78  Pulse: 61  Temp: (!) 97.5 F (36.4 C)  TempSrc: Temporal  SpO2: 98%  Weight: 157 lb 12.8 oz (71.6 kg)  Height: 5' 6"  (1.676 m)   Body mass index is 25.47 kg/m. Physical Exam Vitals signs reviewed.  Constitutional:      Appearance: Normal appearance.  HENT:     Head: Normocephalic and atraumatic.  Eyes:     Extraocular Movements: Extraocular movements intact.     Pupils: Pupils are equal, round, and reactive to  light.  Cardiovascular:     Rate and Rhythm: Normal rate and regular rhythm.     Heart sounds: Murmur present.  Pulmonary:     Effort: Pulmonary effort is normal.     Breath sounds: Normal breath sounds.  Abdominal:     General: Bowel sounds are normal.  Musculoskeletal: Normal range of motion.  Skin:    General: Skin is warm and dry.  Neurological:     General: No focal deficit present.     Mental Status: He is alert and oriented to person, place, and time.     Labs reviewed: Basic Metabolic Panel: Recent Labs    05/02/18 0906 10/01/18 0848  NA 141 140  K 5.4* 4.5  CL 105 106  CO2 28 25  GLUCOSE 133* 107*  BUN 23 35*  CREATININE 2.16* 2.10*  CALCIUM 10.0 9.6   Liver Function Tests: Recent Labs    05/02/18 0906 10/01/18 0848  AST 16 15  ALT 11 12  BILITOT 0.7 0.6  PROT 7.1 7.0   No results for input(s): LIPASE, AMYLASE in the last 8760 hours. No results for input(s): AMMONIA in the last 8760 hours. CBC: Recent Labs    05/02/18 0906 10/01/18 0848  WBC 8.0 9.5  NEUTROABS 5,600 6,251  HGB 14.7 14.3  HCT 42.4 41.6  MCV 92.4 92.9  PLT 223 204   Lipid Panel: Recent Labs    05/02/18 0906 10/01/18 0848  CHOL 95 86  HDL 53 47  LDLCALC 30 25  TRIG 41 49  CHOLHDL 1.8 1.8   Lab Results  Component Value Date   HGBA1C 7.4 (H) 10/01/2018   Assessment/Plan 1. Controlled type 2 diabetes mellitus with microalbuminuria, without long-term current use of insulin (HCC) - cont same regimen except samples available this time were toujeo - linagliptin (TRADJENTA) 5 MG TABS tablet; Take 1 tablet (5 mg total) by mouth daily.  Dispense: 90 tablet; Refill: 1 - Insulin Glargine, 2 Unit Dial, (TOUJEO MAX SOLOSTAR) 300 UNIT/ML SOPN; Inject 15 Units into the skin every morning.  Dispense: 3 pen; Refill: 0  2. Benign essential hypertension -bp well controlled - amLODipine (NORVASC) 2.5 MG tablet; Take 1 tablet (2.5 mg total) by mouth daily.  Dispense: 90 tablet; Refill:  1   3. Chronic kidney disease, stage 4 (severe) (HCC) -Avoid nephrotoxic agents like nsaids, dose adjust renally excreted meds, hydrate.  4. Balance problem -continues, has refused PT amid covid, but it's been a little better since he's back to walking for exercise and sugars are under better control  Labs/tests ordered:  Labs same day as next visit Next appt:  4 mos med mgt--left w/o making appt   Anahli Arvanitis L. Meshell Abdulaziz, D.O. [facility] [address], [place] [address] Cell Phone (Mon-Fri 8am-5pm):  [phone] On Call:  [phone] & follow prompts after 5pm & weekends Office Phone:  [phone] Office Fax:  [phone]

## 2019-02-12 ENCOUNTER — Other Ambulatory Visit: Payer: Self-pay | Admitting: Internal Medicine

## 2019-03-04 DIAGNOSIS — S72141D Displaced intertrochanteric fracture of right femur, subsequent encounter for closed fracture with routine healing: Secondary | ICD-10-CM

## 2019-03-04 DIAGNOSIS — M170 Bilateral primary osteoarthritis of knee: Secondary | ICD-10-CM

## 2019-03-04 DIAGNOSIS — D62 Acute posthemorrhagic anemia: Secondary | ICD-10-CM

## 2019-03-04 DIAGNOSIS — N179 Acute kidney failure, unspecified: Secondary | ICD-10-CM

## 2019-03-04 DIAGNOSIS — E1165 Type 2 diabetes mellitus with hyperglycemia: Secondary | ICD-10-CM

## 2019-03-04 DIAGNOSIS — I4891 Unspecified atrial fibrillation: Secondary | ICD-10-CM

## 2019-03-04 DIAGNOSIS — I10 Essential (primary) hypertension: Secondary | ICD-10-CM

## 2019-03-04 DIAGNOSIS — H409 Unspecified glaucoma: Secondary | ICD-10-CM

## 2019-03-07 DIAGNOSIS — I4819 Other persistent atrial fibrillation: Secondary | ICD-10-CM

## 2019-03-07 HISTORY — DX: Other persistent atrial fibrillation: I48.19

## 2019-03-10 ENCOUNTER — Telehealth: Payer: Self-pay | Admitting: Internal Medicine

## 2019-03-10 ENCOUNTER — Encounter: Payer: Self-pay | Admitting: Internal Medicine

## 2019-03-10 ENCOUNTER — Other Ambulatory Visit: Payer: Self-pay

## 2019-03-10 ENCOUNTER — Ambulatory Visit (INDEPENDENT_AMBULATORY_CARE_PROVIDER_SITE_OTHER): Payer: Medicare PPO | Admitting: Internal Medicine

## 2019-03-10 VITALS — BP 100/60 | HR 96 | Temp 97.7°F | Ht 66.0 in | Wt 154.6 lb

## 2019-03-10 DIAGNOSIS — M62838 Other muscle spasm: Secondary | ICD-10-CM | POA: Diagnosis not present

## 2019-03-10 DIAGNOSIS — N410 Acute prostatitis: Secondary | ICD-10-CM | POA: Diagnosis not present

## 2019-03-10 DIAGNOSIS — B370 Candidal stomatitis: Secondary | ICD-10-CM

## 2019-03-10 DIAGNOSIS — Z8781 Personal history of (healed) traumatic fracture: Secondary | ICD-10-CM | POA: Diagnosis not present

## 2019-03-10 DIAGNOSIS — N184 Chronic kidney disease, stage 4 (severe): Secondary | ICD-10-CM

## 2019-03-10 MED ORDER — NYSTATIN 100000 UNIT/ML MT SUSP: 5.0000 mL | Freq: Four times a day (QID) | OROMUCOSAL | 0 refills | Status: DC

## 2019-03-10 MED ORDER — METHOCARBAMOL 500 MG PO TABS: 500.0000 mg | ORAL_TABLET | Freq: Four times a day (QID) | ORAL | 0 refills | Status: DC | PRN

## 2019-03-10 MED ORDER — TAMSULOSIN HCL 0.4 MG PO CAPS: 0.4000 mg | ORAL_CAPSULE | Freq: Every day | ORAL | 1 refills | Status: DC

## 2019-03-10 NOTE — Patient Instructions (Addendum)
In the am, take one robaxin tablet and one norco tablet.  After one hour, attempt to get up and start exercises.  If therapy comes later in the day, repeat the medications prior to therapy.    Begin flomax 0.4mg  nightly for the enlarged prostate.    Drink plenty of water.  YOU MUST DO YOUR THERAPY AT HOME TO MAKE PROGRESS.

## 2019-03-10 NOTE — Progress Notes (Signed)
Location:  [facility] clinic  Provider: Dr. Hollace Kinnier  Goals of Care:  Advanced Directives 01/23/2019  Does Patient Have a Medical Advance Directive? Yes  Type of Advance Directive Out of facility DNR (pink MOST or yellow form)  Does patient want to make changes to medical advance directive? No - Patient declined  Copy of [place] in Chart? -  Would patient like information on creating a medical advance directive? -  Pre-existing out of facility DNR order (yellow form or pink MOST form) -     Chief Complaint  Patient presents with  . Hospitalization Follow-up    hip fracture [place], [place]  . Transitions Of Care    discharged 03/01/2019    HPI: Patient is a 86 y.o. male seen today for follow-up of recent  hospitalization.   Family present during visit. He presents in a wheelchair today.   He fell at home on 02/26/19. Wife was present when fall occurred. He was admitted to [facility] in [place], [place] on 02/26/2019 with a diagnosis of intertrochanteric fracture of the right hip/femur. Dr. Megan Salon performed a IM nailing of the right femur on 02/27/2019.  He was hospitalized for three days and was discharged 03/01/2019. Family claims he refused to go to rehab after hospitalization. Discharge orders included home health physical therapy, hydrocodone, and xarelto. He returned home with his son and daughter-in-law as his main caretakers.   Since discharge home, family claims he will not do exercises with PT or move around at home. He claims the right leg feels stiff and is very painful.   He saw Dr. Megan Salon last Thursday and recommended seeing his PCP to discuss rehab referral.   Has a wheelchair, rollator, and front rolling walker.   In addition to right hip/leg pain he has been urinating every two hours at night. During the day he does not have urgency or frequency. Family concered he has a urinary tract infection since he was catherized for a  brief period of time.   His sugars have been increased since discharge.      Past Medical History:  Diagnosis Date  . Chronic kidney disease (CKD), stage III (moderate)   . Diabetes mellitus   . Diverticulosis   . Hard of hearing   . Hemorrhoids   . Hx of adenomatous colonic polyps 2006  . Hypertension   . Squamous cell carcinoma of hard palate ([place]) 2003    Past Surgical History:  Procedure Laterality Date  . CHOLECYSTECTOMY N/A 04/17/2014   Procedure: LAPAROSCOPIC CHOLECYSTECTOMY WITH INTRAOPERATIVE CHOLANGIOGRAM;  Surgeon: Erroll Luna, MD;  Location: [place];  Service: General;  Laterality: N/A;  . COLONOSCOPY  11/02/2007  . MAXILLECTOMY Right 2003   SCCA alveolar ridge    No Known Allergies  Outpatient Encounter Medications as of 03/10/2019  Medication Sig  . ACCU-CHEK SOFTCLIX LANCETS lancets 100 each by Other route 3 (three) times daily. Use as instructed Dx:E11.22  . acetaminophen (TYLENOL) 500 MG tablet Take 500-1,000 mg by mouth 3 (three) times daily as needed for mild pain or headache. Usually takes 1 tablet, will take 2 if he has a headache.  Marland Kitchen amLODipine (NORVASC) 2.5 MG tablet Take 1 tablet (2.5 mg total) by mouth daily.  . Blood Glucose Monitoring Suppl (ACCU-CHEK AVIVA PLUS) w/Device KIT 1 Units by Does not apply route as directed. Use as directed to check blood sugar. Dx:E11.22  . EASY COMFORT PEN NEEDLES 31G X 5 MM MISC use as directed  TO INJECT INSULIN TO CONTROL BLOOD SUGAR  . famotidine (PEPCID) 20 MG tablet Take 20 mg by mouth as needed for heartburn or indigestion.  Marland Kitchen glucose blood (ACCU-CHEK AVIVA) test strip Use to test blood sugar three times daily. Dx:E11.22  . Insulin Glargine, 2 Unit Dial, (TOUJEO MAX SOLOSTAR) 300 UNIT/ML SOPN Inject 15 Units into the skin every morning.  . linagliptin (TRADJENTA) 5 MG TABS tablet Take 1 tablet (5 mg total) by mouth daily.  . Multiple Vitamins-Minerals (OCUVITE ADULT 50+) CAPS Take 1 capsule by mouth daily.  .  Nutritional Supplements (HIGH-PROTEIN NUTRITIONAL SHAKE) LIQD Take by mouth as needed.  . pantoprazole (PROTONIX) 40 MG tablet TAKE 1 TABLET (40 MG TOTAL) BY MOUTH 2 (TWO) TIMES DAILY BEFORE A MEAL.   No facility-administered encounter medications on file as of 03/10/2019.    Review of Systems:  Review of Systems  Constitutional: Positive for activity change and fatigue.  HENT: Positive for dental problem and hearing loss. Negative for trouble swallowing.        Denture pain  Respiratory: Negative for cough, shortness of breath and wheezing.   Cardiovascular: Negative for chest pain and palpitations.  Gastrointestinal: Negative for abdominal pain, constipation, diarrhea and nausea.  Genitourinary: Positive for frequency. Negative for dysuria and hematuria.  Musculoskeletal:       Right femur fracture, right leg tightness  Skin:       Sutures to right femur  Neurological: Positive for weakness. Negative for dizziness, tremors and headaches.  Psychiatric/Behavioral: Positive for dysphoric mood and sleep disturbance. The patient is not nervous/anxious.     Health Maintenance  Topic Date Due  . OPHTHALMOLOGY EXAM  03/06/2019  . URINE MICROALBUMIN  05/03/2019  . HEMOGLOBIN A1C  04/03/2019  . FOOT EXAM  01/23/2020  . TETANUS/TDAP  04/16/2021  . INFLUENZA VACCINE  Completed  . PNA vac Low Risk Adult  Completed    Physical Exam: There were no vitals filed for this visit. There is no height or weight on file to calculate BMI. Physical Exam Vitals reviewed.  Constitutional:      Appearance: Normal appearance. He is normal weight.  HENT:     Head: Normocephalic.     Mouth/Throat:     Mouth: Mucous membranes are dry. Oral lesions present.     Dentition: Abnormal dentition. Has dentures.  Cardiovascular:     Rate and Rhythm: Normal rate and regular rhythm.     Pulses: Normal pulses.     Heart sounds: Normal heart sounds. No murmur.  Pulmonary:     Effort: Pulmonary effort is  normal. No respiratory distress.     Breath sounds: Normal breath sounds. No wheezing.  Abdominal:     General: There is no distension.     Palpations: Abdomen is soft.  Musculoskeletal:        General: Tenderness and signs of injury present.     Right hip: Tenderness present. Decreased strength.     Right lower leg: No edema.     Left lower leg: No edema.       Legs:  Skin:    General: Skin is warm and dry.     Capillary Refill: Capillary refill takes less than 2 seconds.  Neurological:     General: No focal deficit present.     Mental Status: He is alert and oriented to person, place, and time. Mental status is at baseline.  Psychiatric:        Mood and Affect: Mood normal.  Behavior: Behavior normal.        Thought Content: Thought content normal.        Judgment: Judgment normal.     Labs reviewed: Basic Metabolic Panel: Recent Labs    05/02/18 0906 10/01/18 0848  NA 141 140  K 5.4* 4.5  CL 105 106  CO2 28 25  GLUCOSE 133* 107*  BUN 23 35*  CREATININE 2.16* 2.10*  CALCIUM 10.0 9.6   Liver Function Tests: Recent Labs    05/02/18 0906 10/01/18 0848  AST 16 15  ALT 11 12  BILITOT 0.7 0.6  PROT 7.1 7.0   No results for input(s): LIPASE, AMYLASE in the last 8760 hours. No results for input(s): AMMONIA in the last 8760 hours. CBC: Recent Labs    05/02/18 0906 10/01/18 0848  WBC 8.0 9.5  NEUTROABS 5,600 6,251  HGB 14.7 14.3  HCT 42.4 41.6  MCV 92.4 92.9  PLT 223 204   Lipid Panel: Recent Labs    05/02/18 0906 10/01/18 0848  CHOL 95 86  HDL 53 47  LDLCALC 30 25  TRIG 41 49  CHOLHDL 1.8 1.8   Lab Results  Component Value Date   HGBA1C 7.4 (H) 10/01/2018    Procedures since last visit: No results found.  Assessment/Plan 1. S/P right hip fracture - not progressed ten days post-op with ambulation - followed by Dr. Megan Salon - encourage ambulation and exercises while preventing admission into SNF - referral for occupational therapy,  3X/week - encourage pre medicating before therapy sessions - CBC with differential/platelets- today  2. Muscle spasm - suspect muscle tightness from inability to ambulate - start Robaxin 500 mg every 6 hours PRN for muscle spasms and tightness  3. Acute prostatitis - he was briefly cathed during his hospitalization - he is having symptoms of frequency at night disrupting sleep, and is extremely fatigued during the day - start tamsulosin HCK 0.4 mg daily at bedtime for frequency  4. Oral thrush - mouth with poor dentition, and white patches - start nystatin 500,000 units oral 4 times daily  5. Chronic kidney disease, stage 4 (severe) (HCC) - basic metabolic panel- today   Labs/tests ordered:  CBC with differetial /platelets, basic metabolic panel- today Next appt:  03/10/2019

## 2019-03-11 LAB — CBC WITH DIFFERENTIAL/PLATELET
Absolute Monocytes: 2188 cells/uL — ABNORMAL HIGH (ref 200–950)
Basophils Absolute: 44 cells/uL (ref 0–200)
Basophils Relative: 0.2 %
Eosinophils Absolute: 0 cells/uL — ABNORMAL LOW (ref 15–500)
Eosinophils Relative: 0 %
HCT: 30.7 % — ABNORMAL LOW (ref 38.5–50.0)
Hemoglobin: 10.1 g/dL — ABNORMAL LOW (ref 13.2–17.1)
Lymphs Abs: 508 cells/uL — ABNORMAL LOW (ref 850–3900)
MCH: 31.5 pg (ref 27.0–33.0)
MCHC: 32.9 g/dL (ref 32.0–36.0)
MCV: 95.6 fL (ref 80.0–100.0)
MPV: 9.3 fL (ref 7.5–12.5)
Monocytes Relative: 9.9 %
Neutro Abs: 19360 cells/uL — ABNORMAL HIGH (ref 1500–7800)
Neutrophils Relative %: 87.6 %
Platelets: 561 10*3/uL — ABNORMAL HIGH (ref 140–400)
RBC: 3.21 10*6/uL — ABNORMAL LOW (ref 4.20–5.80)
RDW: 13.7 % (ref 11.0–15.0)
Total Lymphocyte: 2.3 %
WBC: 22.1 10*3/uL — ABNORMAL HIGH (ref 3.8–10.8)

## 2019-03-11 LAB — BASIC METABOLIC PANEL
BUN/Creatinine Ratio: 20 (calc) (ref 6–22)
BUN: 50 mg/dL — ABNORMAL HIGH (ref 7–25)
CO2: 19 mmol/L — ABNORMAL LOW (ref 20–32)
Calcium: 9 mg/dL (ref 8.6–10.3)
Chloride: 100 mmol/L (ref 98–110)
Creat: 2.44 mg/dL — ABNORMAL HIGH (ref 0.70–1.11)
Glucose, Bld: 179 mg/dL — ABNORMAL HIGH (ref 65–139)
Potassium: 4.4 mmol/L (ref 3.5–5.3)
Sodium: 135 mmol/L (ref 135–146)

## 2019-03-11 MED ORDER — LEVOFLOXACIN 250 MG PO TABS: ORAL_TABLET | ORAL | 0 refills | Status: DC

## 2019-03-11 MED ORDER — SACCHAROMYCES BOULARDII 250 MG PO CAPS: 250.0000 mg | ORAL_CAPSULE | Freq: Two times a day (BID) | ORAL | 0 refills | Status: DC

## 2019-03-11 NOTE — Progress Notes (Signed)
Please notify his daughter-in-law who was here yesterday:  I'm bothered by the degree of his WBC count.  He was having frequency at night but no other localizing symptoms.  We suspected prostatitis from his catheter, but appears there is an infection.  I'd like to start him on antibiotics (levaquin 500mg  once today, then 250mg  daily for 14 days and I need him to drink plenty of water today b/c he is dehydrated.    I can send the antibiotic to the local [facility].  It will also take care of UTI if that's the cause.

## 2019-03-11 NOTE — Addendum Note (Signed)
Addended by: Gayland Curry on: 03/11/2019 09:52 AM   Modules accepted: Orders

## 2019-03-11 NOTE — Telephone Encounter (Signed)
This was a mistake - did not make this call

## 2019-03-13 ENCOUNTER — Telehealth: Payer: Self-pay | Admitting: Internal Medicine

## 2019-03-13 NOTE — Telephone Encounter (Signed)
Chelsea called on call and notified her that pt may now be in afib.  Ideally, should be seen in office asap for EKG, adjustment in xarelto dose and for rate control med to be added.  Please call his daughter-in-law to make appt.  If snow becomes an issue, we may need to make changes via virtual visit platform.  Thanks.

## 2019-03-14 ENCOUNTER — Encounter: Payer: Self-pay | Admitting: Internal Medicine

## 2019-03-14 ENCOUNTER — Ambulatory Visit (INDEPENDENT_AMBULATORY_CARE_PROVIDER_SITE_OTHER): Payer: Medicare PPO | Admitting: Internal Medicine

## 2019-03-14 ENCOUNTER — Other Ambulatory Visit: Payer: Self-pay

## 2019-03-14 ENCOUNTER — Telehealth: Payer: Self-pay | Admitting: Internal Medicine

## 2019-03-14 VITALS — BP 110/62 | HR 100 | Temp 96.6°F | Ht 66.0 in | Wt 154.6 lb

## 2019-03-14 DIAGNOSIS — L89152 Pressure ulcer of sacral region, stage 2: Secondary | ICD-10-CM | POA: Insufficient documentation

## 2019-03-14 DIAGNOSIS — I4891 Unspecified atrial fibrillation: Secondary | ICD-10-CM

## 2019-03-14 DIAGNOSIS — I499 Cardiac arrhythmia, unspecified: Secondary | ICD-10-CM | POA: Insufficient documentation

## 2019-03-14 DIAGNOSIS — Z8781 Personal history of (healed) traumatic fracture: Secondary | ICD-10-CM | POA: Insufficient documentation

## 2019-03-14 DIAGNOSIS — N184 Chronic kidney disease, stage 4 (severe): Secondary | ICD-10-CM | POA: Diagnosis not present

## 2019-03-14 DIAGNOSIS — I495 Sick sinus syndrome: Secondary | ICD-10-CM | POA: Diagnosis not present

## 2019-03-14 DIAGNOSIS — N410 Acute prostatitis: Secondary | ICD-10-CM | POA: Insufficient documentation

## 2019-03-14 DIAGNOSIS — I4819 Other persistent atrial fibrillation: Secondary | ICD-10-CM | POA: Insufficient documentation

## 2019-03-14 DIAGNOSIS — R2689 Other abnormalities of gait and mobility: Secondary | ICD-10-CM

## 2019-03-14 MED ORDER — RIVAROXABAN 15 MG PO TABS: 15.0000 mg | ORAL_TABLET | Freq: Every day | ORAL | 5 refills | Status: DC

## 2019-03-14 MED ORDER — METOPROLOL TARTRATE 25 MG PO TABS: 12.5000 mg | ORAL_TABLET | Freq: Two times a day (BID) | ORAL | 1 refills | Status: DC

## 2019-03-14 NOTE — Telephone Encounter (Signed)
Patient was called and came in for EKG and OV

## 2019-03-14 NOTE — Telephone Encounter (Signed)
Called patient daughter no answer

## 2019-03-14 NOTE — Progress Notes (Signed)
Location:  [facility] clinic Provider: Yuvonne Lanahan L. Mariea Clonts, D.O., C.M.D.  Code Status: DNR Goals of Care:  Advanced Directives 01/23/2019  Does Patient Have a Medical Advance Directive? Yes  Type of Advance Directive Out of facility DNR (pink MOST or yellow form)  Does patient want to make changes to medical advance directive? No - Patient declined  Copy of [place] in Chart? -  Would patient like information on creating a medical advance directive? -  Pre-existing out of facility DNR order (yellow form or pink MOST form) -     Chief Complaint  Patient presents with  . Acute Visit    irregular heart beat per home health yesterday    HPI: Patient is a 86 y.o. male seen today for an acute visit for irregular heart beat noted by home health team yesterday.  Call was placed to on call provider at 5:05pm and I was immediately notified.  We called all of the numbers for him and his family members all morning and finally reached them at about 11:30 to tell his daughter in law, [name], that he needed to come in today rather than Monday to be seen due to the variable heart rate and suspected afib.  He admits to increased sob, fatigue and malaise, and dizziness.  He does not notice any chest pain or palpitations.    When he was just seen Monday, he was suspected to have prostatitis vs UTI and started on antibiotics with levaquin for this.   His urinary frequency persists at bedtime especially but odor of urine improved and appearance and he's starting to walk some to the restroom with his walker and assistance to transfer.    His biggest complaint today is that his butt his sore on the right cheek and near the incision site.  There is still significant bruising there and swelling, but no erythema, warmth or drainage at any of the three sets of staples.  Using robaxin at hs and less of the oxycodone.  Is faithful with taking his tylenol.  Gets staples out next week.  Glucose down--this  am was 140 which is around his baseline.  Past Medical History:  Diagnosis Date  . Chronic kidney disease (CKD), stage III (moderate)   . Diabetes mellitus   . Diverticulosis   . Hard of hearing   . Hemorrhoids   . Hx of adenomatous colonic polyps 2006  . Hypertension   . 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 CHOLECYSTECTOMY N/A 04/17/2014   Procedure: LAPAROSCOPIC CHOLECYSTECTOMY WITH INTRAOPERATIVE CHOLANGIOGRAM;  Surgeon: Erroll Luna, MD;  Location: [place];  Service: General;  Laterality: N/A;  . COLONOSCOPY  11/02/2007  . MAXILLECTOMY Right 2003   SCCA alveolar ridge    No Known Allergies  Outpatient Encounter Medications as of 03/14/2019  Medication Sig  . ACCU-CHEK SOFTCLIX LANCETS lancets 100 each by Other route 3 (three) times daily. Use as instructed Dx:E11.22  . acetaminophen (TYLENOL) 500 MG tablet Take 500-1,000 mg by mouth 3 (three) times daily as needed for mild pain or headache. Usually takes 1 tablet, will take 2 if he has a headache.  Marland Kitchen amLODipine (NORVASC) 2.5 MG tablet Take 1 tablet (2.5 mg total) by mouth daily.  . Blood Glucose Monitoring Suppl (ACCU-CHEK AVIVA PLUS) w/Device KIT 1 Units by Does not apply route as directed. Use as directed to check blood sugar. Dx:E11.22  . EASY COMFORT PEN  NEEDLES 31G X 5 MM MISC use as directed TO INJECT INSULIN TO CONTROL BLOOD SUGAR  . famotidine (PEPCID) 20 MG tablet Take 20 mg by mouth as needed for heartburn or indigestion.  Marland Kitchen glucose blood (ACCU-CHEK AVIVA) test strip Use to test blood sugar three times daily. Dx:E11.22  . HYDROcodone-acetaminophen (NORCO) 7.5-325 MG tablet Take 1 tablet by mouth every 6 (six) hours as needed.  . Insulin Glargine, 2 Unit Dial, (TOUJEO MAX SOLOSTAR) 300 UNIT/ML SOPN Inject 15 Units into the skin every morning.  Marland Kitchen levofloxacin (LEVAQUIN) 250 MG tablet 2 tablets today, then one po daily x 14 days  . linagliptin (TRADJENTA)  5 MG TABS tablet Take 1 tablet (5 mg total) by mouth daily.  . methocarbamol (ROBAXIN) 500 MG tablet Take 1 tablet (500 mg total) by mouth every 6 (six) hours as needed for muscle spasms.  . Multiple Vitamins-Minerals (OCUVITE ADULT 50+) CAPS Take 1 capsule by mouth daily.  . Nutritional Supplements (HIGH-PROTEIN NUTRITIONAL SHAKE) LIQD Take by mouth as needed.  . nystatin (MYCOSTATIN) 100000 UNIT/ML suspension Take 5 mLs (500,000 Units total) by mouth 4 (four) times daily.  . pantoprazole (PROTONIX) 40 MG tablet TAKE 1 TABLET (40 MG TOTAL) BY MOUTH 2 (TWO) TIMES DAILY BEFORE A MEAL.  Marland Kitchen saccharomyces boulardii (FLORASTOR) 250 MG capsule Take 1 capsule (250 mg total) by mouth 2 (two) times daily.  . tamsulosin (FLOMAX) 0.4 MG CAPS capsule Take 1 capsule (0.4 mg total) by mouth at bedtime.  Alveda Reasons 10 MG TABS tablet Take 1 tablet by mouth daily as needed.   No facility-administered encounter medications on file as of 03/14/2019.    Review of Systems:  Review of Systems  Constitutional: Negative for chills, fever and malaise/fatigue.  HENT: Positive for hearing loss. Negative for congestion and sore throat.   Eyes: Negative for blurred vision.  Respiratory: Positive for shortness of breath. Negative for cough and wheezing.   Cardiovascular: Positive for leg swelling. Negative for chest pain and palpitations.       Only on surgical leg  Gastrointestinal: Negative for abdominal pain and constipation.  Genitourinary: Positive for frequency. Negative for dysuria.  Musculoskeletal: Positive for joint pain. Negative for falls.  Skin: Negative for itching and rash.       Pressure ulcer  Neurological: Negative for dizziness and loss of consciousness.  Endo/Heme/Allergies: Bruises/bleeds easily.  Psychiatric/Behavioral: Positive for depression. Negative for memory loss. The patient is not nervous/anxious and does not have insomnia.     Health Maintenance  Topic Date Due  . OPHTHALMOLOGY EXAM   03/06/2019  . URINE MICROALBUMIN  05/03/2019  . HEMOGLOBIN A1C  04/03/2019  . FOOT EXAM  01/23/2020  . TETANUS/TDAP  04/16/2021  . INFLUENZA VACCINE  Completed  . PNA vac Low Risk Adult  Completed    Physical Exam: There were no vitals filed for this visit. There is no height or weight on file to calculate BMI. Physical Exam Vitals reviewed.  Constitutional:      General: He is not in acute distress.    Appearance: Normal appearance. He is ill-appearing. He is not toxic-appearing.  HENT:     Head: Normocephalic and atraumatic.     Ears:     Comments: Very HOH Eyes:     Extraocular Movements: Extraocular movements intact.     Pupils: Pupils are equal, round, and reactive to light.  Cardiovascular:     Rate and Rhythm: Tachycardia present. Rhythm irregular.     Heart sounds: No  murmur.  Pulmonary:     Breath sounds: Normal breath sounds. No wheezing, rhonchi or rales.     Comments: Slight dypsnea Abdominal:     General: Bowel sounds are normal.  Musculoskeletal:     Right lower leg: Edema present.  Skin:    General: Skin is warm and dry.     Comments: Right sacral stage 2 pressure ulcer less than 1cm open area with surrounding erythema; some erythema over entire sacral region; right hip three incision sites have staples intact, just mild erythema/scaly skin; large ecchymoses around them with some swelling of entire leg vs left but not changed from Monday's visit  Neurological:     General: No focal deficit present.     Mental Status: He is alert and oriented to person, place, and time.     Cranial Nerves: No cranial nerve deficit.     Motor: Weakness present.     Gait: Gait abnormal.     Labs reviewed: Basic Metabolic Panel: Recent Labs    05/02/18 0906 10/01/18 0848 03/10/19 1342  NA 141 140 135  K 5.4* 4.5 4.4  CL 105 106 100  CO2 28 25 19*  GLUCOSE 133* 107* 179*  BUN 23 35* 50*  CREATININE 2.16* 2.10* 2.44*  CALCIUM 10.0 9.6 9.0   Liver Function  Tests: Recent Labs    05/02/18 0906 10/01/18 0848  AST 16 15  ALT 11 12  BILITOT 0.7 0.6  PROT 7.1 7.0   No results for input(s): LIPASE, AMYLASE in the last 8760 hours. No results for input(s): AMMONIA in the last 8760 hours. CBC: Recent Labs    05/02/18 0906 10/01/18 0848 03/10/19 1342  WBC 8.0 9.5 22.1*  NEUTROABS 5,600 6,251 19,360*  HGB 14.7 14.3 10.1*  HCT 42.4 41.6 30.7*  MCV 92.4 92.9 95.6  PLT 223 204 561*   Lipid Panel: Recent Labs    05/02/18 0906 10/01/18 0848  CHOL 95 86  HDL 53 47  LDLCALC 30 25  TRIG 41 49  CHOLHDL 1.8 1.8   Lab Results  Component Value Date   HGBA1C 7.4 (H) 10/01/2018    Procedures since last visit: "EKG today at 140bpm in afib"  Assessment/Plan 1. New onset atrial fibrillation (HCC) -increase xarelto to '15mg'$  po daily (for stroke prevention with his degree of ckd as per current package insert) from '10mg'$  po daily which was dvt prophylaxis post hip fx--this will now be chronic medication -he's had difficulty with bradycardia in the past so I'm a bit concerned about lowering his pulse too much or causing hypotension with rate control--will try him on short-acting metoprolol tartrate (lopressor) 12.'5mg'$  po bid -we did not have lopressor or cardizem in the e-kit here so I gave him a bystolic which did bring his pulse down to 90s and he appeared less dyspneic  2. Irregular heart beat - turned out to be in rapid afib so will treat with appropriate dose anticoagulation and lopressor rate control - EKG 12-Lead  3. Tachycardia-bradycardia syndrome ([place]) -has h/o this but was not in afib historically so that's new at least since yesterday -he's been having periods of dizziness so I suspect it's been coming and going for a few days - EKG 12-Lead  4. Chronic kidney disease, stage 4 (severe) (HCC) -Avoid nephrotoxic agents like nsaids, dose adjust renally excreted meds, hydrate. -is doing pretty well with drinking and eating now  5. S/P  right hip fracture -is getting home health PT, OT and they are  helping tremendously -his daughter-in-law reports he is now doing his therapy and some walking with her   6.  Balance problem -has been an issue prior and he was not always adherent with assistive device use before -cont home PT, OT -I did order a walker (rolling with skis) and a wheelchair for him with cushion   7.  Stage 2 right sacral pressure ulcer  -getting home health nursing coming out -counseled on importance of regular movement and not sitting in one place all day and all night -to use cushion in wheelchair and hard seats -start a barrier cream like endit, desitin, gerhart's butt cream, etc  8.  Acute prostatitis -sugars are trending down, he's more alert and his urine smell and appearance are improved per his daughter-in-law -his frequency still continues despite his flomax and completing his course of levaquin that was started on on 1/5 for a 14 day course  Labs/tests ordered:  No new (just done Monday) Next appt:  03/24/2019 for f/u on afib and all above  Aubria Vanecek L. Hallie Ishida, D.O. [facility] [address], [place] [address] Cell Phone (Mon-Fri 8am-5pm):  [phone] On Call:  [phone] & follow prompts after 5pm & weekends Office Phone:  [phone] Office Fax:  [phone]

## 2019-03-14 NOTE — Telephone Encounter (Signed)
.  left message to have patient's daughter in-law return my call.

## 2019-03-14 NOTE — Telephone Encounter (Signed)
LVM for patient to call to be seen today - per Dr. Mariea Clonts

## 2019-03-14 NOTE — Patient Instructions (Addendum)
Please begin xarelto 15mg  by mouth daily in place of the 10mg  daily.  This will be long-term.  Start lopressor (metoprolol) 12.5mg  by mouth twice a day for the irregular heart beat (atrial fibrillation).    I have written prescriptions for your wheelchair and rolling walker with skis to have to use since you're borrowing your current ones from the [facility].  You can get these from the [facility] medical supply or advance home care on [address]   For your sore bottom, you have a pressure ulcer on the right cheek near your crack.  To keep this from getting worse, you should get up and change positions frequently.  Also, we can use a cream on your bottom as a barrier after your morning bath or incontinence episodes.  We also need you to use a cushion to decrease pressure.    Keep drinking plenty of water.

## 2019-03-17 ENCOUNTER — Ambulatory Visit: Payer: Self-pay | Admitting: Internal Medicine

## 2019-03-17 NOTE — Progress Notes (Signed)
I would not recommend stopping the pain medication altogether.  I'd suggest using it at bedtime to help with sleep and avoiding using the oxycodone in the day and using tylenol instead.  Sleep meds will increase his risk of falling.  It's important to try to keep him busy in the day so he cannot sleep then and sleeps better at night.  Frequent repositioning must be continued due to the sore on his buttocks.  It will be interesting to see if his pulse is better when therapy sees him.

## 2019-03-18 ENCOUNTER — Telehealth: Payer: Self-pay | Admitting: *Deleted

## 2019-03-18 NOTE — Telephone Encounter (Signed)
[facility] (left no name) called and left message on Clinical Intake Voicemail stating that they received an order from Dr. Mariea Clonts for some DME Equipment and they need the last OV note faxed to them for insurance. Faxed OV note to Fax: [phone]

## 2019-03-24 ENCOUNTER — Ambulatory Visit (INDEPENDENT_AMBULATORY_CARE_PROVIDER_SITE_OTHER): Payer: Medicare PPO | Admitting: Internal Medicine

## 2019-03-24 ENCOUNTER — Encounter: Payer: Self-pay | Admitting: Internal Medicine

## 2019-03-24 ENCOUNTER — Other Ambulatory Visit: Payer: Self-pay

## 2019-03-24 VITALS — BP 122/76 | HR 99 | Temp 97.3°F | Ht 66.0 in

## 2019-03-24 DIAGNOSIS — I4891 Unspecified atrial fibrillation: Secondary | ICD-10-CM | POA: Diagnosis not present

## 2019-03-24 DIAGNOSIS — I499 Cardiac arrhythmia, unspecified: Secondary | ICD-10-CM | POA: Diagnosis not present

## 2019-03-24 DIAGNOSIS — I495 Sick sinus syndrome: Secondary | ICD-10-CM | POA: Diagnosis not present

## 2019-03-24 DIAGNOSIS — N184 Chronic kidney disease, stage 4 (severe): Secondary | ICD-10-CM

## 2019-03-24 DIAGNOSIS — Z8781 Personal history of (healed) traumatic fracture: Secondary | ICD-10-CM

## 2019-03-24 MED ORDER — METOPROLOL TARTRATE 25 MG PO TABS: 25.0000 mg | ORAL_TABLET | Freq: Two times a day (BID) | ORAL | 1 refills | Status: DC

## 2019-03-24 NOTE — Progress Notes (Signed)
Location:  [facility] [place] clinic  Provider: Dr. Hollace Kinnier  Goals of Care:  Advanced Directives 03/14/2019  Does Patient Have a Medical Advance Directive? Yes  Type of Advance Directive Out of facility DNR (pink MOST or yellow form)  Does patient want to make changes to medical advance directive? No - Patient declined  Copy of [place] in Chart? -  Would patient like information on creating a medical advance directive? -  Pre-existing out of facility DNR order (yellow form or pink MOST form) Yellow form placed in chart (order not valid for inpatient use)     Chief Complaint  Patient presents with  . Medical Management of Chronic Issues    Patient returns for follow up on irregular heartbeat. Patient has 2 remaining on antibiotic. Patient unable to weigh today. Family would like 12.71m tablets of metoprolol due to difficulty splitting.     HPI: Patient is a 86y.o. male seen today for medical management of chronic diseases.    Wife and daughter-in-law present for visit.   He now resides at his home and not at his daughters.   PT is coming tomorrow. He saw Dr. CMegan Salon orthopedic surgeon, last week. Staples were removed. He was not concerned with swelling or right knee pain.   Still having right hip pain post hip replacement. Taking tylenol during day and norco at night. Family still reports him sleeping during the day and restless up at night. He can stand and use bathroom. He can bathe himself. Still using a wheelchair most of the day and standing for transfers only. Claims the pain in his right knee prevents him from ambulating more.   No recent injuries or falls.   Still having issues with constipation related to opioid pain medication. He is receiving du   Past Medical History:  Diagnosis Date  . Chronic kidney disease (CKD), stage III (moderate)   . Diabetes mellitus   . Diverticulosis   . Hard of hearing   . Hemorrhoids   . Hx of adenomatous colonic  polyps 2006  . Hypertension   . Squamous cell carcinoma of hard palate ([place] 2003    Past Surgical History:  Procedure Laterality Date  . CHOLECYSTECTOMY N/A 04/17/2014   Procedure: LAPAROSCOPIC CHOLECYSTECTOMY WITH INTRAOPERATIVE CHOLANGIOGRAM;  Surgeon: TErroll Luna MD;  [place] [facility]  Service: General;  Laterality: N/A;  . COLONOSCOPY  11/02/2007  . MAXILLECTOMY Right 2003   SCCA alveolar ridge    No Known Allergies  Outpatient Encounter Medications as of 03/24/2019  Medication Sig  . ACCU-CHEK SOFTCLIX LANCETS lancets 100 each by Other route 3 (three) times daily. Use as instructed Dx:E11.22  . acetaminophen (TYLENOL) 500 MG tablet Take 500-1,000 mg by mouth 3 (three) times daily as needed for mild pain or headache. Usually takes 1 tablet, will take 2 if he has a headache.  . Blood Glucose Monitoring Suppl (ACCU-CHEK AVIVA PLUS) w/Device KIT 1 Units by Does not apply route as directed. Use as directed to check blood sugar. Dx:E11.22  . EASY COMFORT PEN NEEDLES 31G X 5 MM MISC use as directed TO INJECT INSULIN TO CONTROL BLOOD SUGAR  . famotidine (PEPCID) 20 MG tablet Take 20 mg by mouth as needed for heartburn or indigestion.  .Marland Kitchenglucose blood (ACCU-CHEK AVIVA) test strip Use to test blood sugar three times daily. Dx:E11.22  . HYDROcodone-acetaminophen (NORCO) 7.5-325 MG tablet Take 1 tablet by mouth every 6 (six) hours as needed.  . Insulin Glargine, 2 Unit  Dial, (TOUJEO MAX SOLOSTAR) 300 UNIT/ML SOPN Inject 15 Units into the skin every morning.  Marland Kitchen levofloxacin (LEVAQUIN) 250 MG tablet 2 tablets today, then one po daily x 14 days  . linagliptin (TRADJENTA) 5 MG TABS tablet Take 1 tablet (5 mg total) by mouth daily.  . methocarbamol (ROBAXIN) 500 MG tablet Take 1 tablet (500 mg total) by mouth every 6 (six) hours as needed for muscle spasms.  . metoprolol tartrate (LOPRESSOR) 25 MG tablet Take 0.5 tablets (12.5 mg total) by mouth 2 (two) times daily.  . Multiple Vitamins-Minerals  (OCUVITE ADULT 50+) CAPS Take 1 capsule by mouth daily.  . Nutritional Supplements (HIGH-PROTEIN NUTRITIONAL SHAKE) LIQD Take by mouth as needed.  . pantoprazole (PROTONIX) 40 MG tablet TAKE 1 TABLET (40 MG TOTAL) BY MOUTH 2 (TWO) TIMES DAILY BEFORE A MEAL.  Marland Kitchen Rivaroxaban (XARELTO) 15 MG TABS tablet Take 1 tablet (15 mg total) by mouth daily.  Marland Kitchen saccharomyces boulardii (FLORASTOR) 250 MG capsule Take 1 capsule (250 mg total) by mouth 2 (two) times daily.  . tamsulosin (FLOMAX) 0.4 MG CAPS capsule Take 1 capsule (0.4 mg total) by mouth at bedtime.  . [DISCONTINUED] nystatin (MYCOSTATIN) 100000 UNIT/ML suspension Take 5 mLs (500,000 Units total) by mouth 4 (four) times daily.   No facility-administered encounter medications on file as of 03/24/2019.    Review of Systems:  Review of Systems  Constitutional: Negative for activity change, appetite change and fatigue.  HENT: Positive for hearing loss.   Respiratory: Negative for cough and shortness of breath.   Cardiovascular: Positive for leg swelling. Negative for chest pain.  Gastrointestinal: Positive for constipation. Negative for abdominal pain, diarrhea and nausea.  Musculoskeletal: Positive for arthralgias and joint swelling.       Right knee pain  Skin: Negative.   Neurological: Positive for weakness. Negative for dizziness and headaches.  Psychiatric/Behavioral: Positive for sleep disturbance. Negative for dysphoric mood. The patient is not nervous/anxious.     Health Maintenance  Topic Date Due  . OPHTHALMOLOGY EXAM  03/06/2019  . URINE MICROALBUMIN  05/03/2019  . HEMOGLOBIN A1C  04/03/2019  . FOOT EXAM  01/23/2020  . TETANUS/TDAP  04/16/2021  . INFLUENZA VACCINE  Completed  . PNA vac Low Risk Adult  Completed    Physical Exam: Vitals:   03/24/19 1307  BP: 122/76  Pulse: 99  Temp: (!) 97.3 F (36.3 C)  SpO2: 96%  Height: 5' 6"  (1.676 m)   Body mass index is 24.95 kg/m. Physical Exam Constitutional:       Appearance: Normal appearance. He is normal weight.  Cardiovascular:     Rate and Rhythm: Tachycardia present. Rhythm irregular.     Pulses: Normal pulses.     Heart sounds: Normal heart sounds. No murmur.  Pulmonary:     Effort: Pulmonary effort is normal. No respiratory distress.     Breath sounds: Normal breath sounds. No wheezing.  Abdominal:     General: Abdomen is flat. Bowel sounds are normal. There is no distension.     Palpations: Abdomen is soft.     Tenderness: There is no abdominal tenderness.  Musculoskeletal:     Right lower leg: Edema present.     Left lower leg: No edema.       Legs:  Skin:    General: Skin is warm and dry.     Capillary Refill: Capillary refill takes less than 2 seconds.  Neurological:     General: No focal deficit present.  Mental Status: He is alert and oriented to person, place, and time. Mental status is at baseline.  Psychiatric:        Mood and Affect: Mood normal.        Behavior: Behavior normal.        Thought Content: Thought content normal.        Judgment: Judgment normal.     Labs reviewed: Basic Metabolic Panel: Recent Labs    05/02/18 0906 10/01/18 0848 03/10/19 1342  NA 141 140 135  K 5.4* 4.5 4.4  CL 105 106 100  CO2 28 25 19*  GLUCOSE 133* 107* 179*  BUN 23 35* 50*  CREATININE 2.16* 2.10* 2.44*  CALCIUM 10.0 9.6 9.0   Liver Function Tests: Recent Labs    05/02/18 0906 10/01/18 0848  AST 16 15  ALT 11 12  BILITOT 0.7 0.6  PROT 7.1 7.0   No results for input(s): LIPASE, AMYLASE in the last 8760 hours. No results for input(s): AMMONIA in the last 8760 hours. CBC: Recent Labs    05/02/18 0906 10/01/18 0848 03/10/19 1342  WBC 8.0 9.5 22.1*  NEUTROABS 5,600 6,251 19,360*  HGB 14.7 14.3 10.1*  HCT 42.4 41.6 30.7*  MCV 92.4 92.9 95.6  PLT 223 204 561*   Lipid Panel: Recent Labs    05/02/18 0906 10/01/18 0848  CHOL 95 86  HDL 53 47  LDLCALC 30 25  TRIG 41 49  CHOLHDL 1.8 1.8   Lab Results    Component Value Date   HGBA1C 7.4 (H) 10/01/2018    Procedures since last visit: No results found.  Assessment/Plan 1. New onset atrial fibrillation (HCC) - EKG- 12 Lead- today - EKG results a-fib with rapid ventricular rate of 130's - will increase metoprolol tartrate to 25 mg BID  - referral to A-fib clinic   2. Irregular heart beat - same as above  3. Tachycardia-bradycardia syndrome ([place]) - has had a history of this in the past but not categorized as a-fib - EKG 12-Lead-today  4. Chronic kidney disease, stage 4 (severe)(HCC) - avoid nephrotoxic agents like NSAIDS and dose adjust renally excreted meds, hydrate  5. S/P right hip fracture - followed by Dr. Megan Salon in [place], [place] - is getting home health PT - he is now able to transfer for bathroom and bathing on his own - still using hydrocodone once a day for pain       Labs/tests ordered:  EKG- 12 lead- today, referral to a-fib clinic Next appt:  4 week follow up

## 2019-03-24 NOTE — Patient Instructions (Addendum)
You may try melatonin 5mg  one hour before bed to help get your sleep cycle back on track.    Your pulse is still way too high.   Increase your metoprolol to 25mg  by mouth twice a day.   We are going to schedule you with the afib clinic so they can treat this problem more aggressively.

## 2019-03-25 ENCOUNTER — Ambulatory Visit (HOSPITAL_COMMUNITY)
Admission: RE | Admit: 2019-03-25 | Discharge: 2019-03-25 | Disposition: A | Discharge status: Home / Self Care | Payer: Medicare PPO | Source: Ambulatory Visit | Attending: Physician Assistant | Admitting: Physician Assistant

## 2019-03-25 ENCOUNTER — Encounter (HOSPITAL_COMMUNITY): Payer: Self-pay | Admitting: Physician Assistant

## 2019-03-25 ENCOUNTER — Other Ambulatory Visit: Payer: Self-pay

## 2019-03-25 VITALS — BP 118/68 | HR 122 | Ht 66.0 in | Wt 154.6 lb

## 2019-03-25 DIAGNOSIS — N183 Chronic kidney disease, stage 3 unspecified: Secondary | ICD-10-CM | POA: Diagnosis not present

## 2019-03-25 DIAGNOSIS — E1122 Type 2 diabetes mellitus with diabetic chronic kidney disease: Secondary | ICD-10-CM | POA: Insufficient documentation

## 2019-03-25 DIAGNOSIS — Z833 Family history of diabetes mellitus: Secondary | ICD-10-CM | POA: Insufficient documentation

## 2019-03-25 DIAGNOSIS — I4819 Other persistent atrial fibrillation: Secondary | ICD-10-CM | POA: Insufficient documentation

## 2019-03-25 DIAGNOSIS — Z7901 Long term (current) use of anticoagulants: Secondary | ICD-10-CM | POA: Diagnosis not present

## 2019-03-25 DIAGNOSIS — I129 Hypertensive chronic kidney disease with stage 1 through stage 4 chronic kidney disease, or unspecified chronic kidney disease: Secondary | ICD-10-CM | POA: Diagnosis not present

## 2019-03-25 DIAGNOSIS — Z79899 Other long term (current) drug therapy: Secondary | ICD-10-CM | POA: Diagnosis not present

## 2019-03-25 DIAGNOSIS — D6869 Other thrombophilia: Secondary | ICD-10-CM

## 2019-03-25 DIAGNOSIS — H9190 Unspecified hearing loss, unspecified ear: Secondary | ICD-10-CM | POA: Diagnosis not present

## 2019-03-25 DIAGNOSIS — Z85818 Personal history of malignant neoplasm of other sites of lip, oral cavity, and pharynx: Secondary | ICD-10-CM | POA: Insufficient documentation

## 2019-03-25 DIAGNOSIS — Z841 Family history of disorders of kidney and ureter: Secondary | ICD-10-CM | POA: Diagnosis not present

## 2019-03-25 DIAGNOSIS — Z87891 Personal history of nicotine dependence: Secondary | ICD-10-CM | POA: Insufficient documentation

## 2019-03-25 DIAGNOSIS — I4891 Unspecified atrial fibrillation: Secondary | ICD-10-CM | POA: Diagnosis present

## 2019-03-25 DIAGNOSIS — Z794 Long term (current) use of insulin: Secondary | ICD-10-CM | POA: Insufficient documentation

## 2019-03-25 LAB — T4, FREE: Free T4: 1.48 ng/dL — ABNORMAL HIGH (ref 0.61–1.12)

## 2019-03-25 LAB — MAGNESIUM: Magnesium: 2.1 mg/dL (ref 1.7–2.4)

## 2019-03-25 LAB — TSH: TSH: 4.687 u[IU]/mL — ABNORMAL HIGH (ref 0.350–4.500)

## 2019-03-25 NOTE — Addendum Note (Signed)
Encounter addended by: Juluis Mire, RN on: 03/25/2019 4:31 PM  Actions taken: Order list changed

## 2019-03-25 NOTE — Progress Notes (Addendum)
Primary Care Physician: Gayland Curry, DO Primary Cardiologist: none Primary Electrophysiologist: none Referring Physician: Dr Larena Glassman is a 86 y.o. male with a history of CKD, DM, HTN, squamous cell carcinoma of hard palate, fall with hip fracture s/p surgical repair 02/27/19, and persistent atrial fibrillation who presents for consultation in the [facility].  The patient was initially diagnosed with atrial fibrillation on 03/14/19 after presenting to his PCP office after an abnormal ECG by home health. ECG confirmed afib with RVR. His Xarelto was increased to 15 mg daily for a CHADS2VASC score of 4. He was also started on Lopressor for rate control. On follow up with PCP 03/24/19 patient was still in afib with RVR with symptoms of fatigue. His Lopressor was increased. Of note, he was also recently on antibiotics for acute prostatitis. He does have a history of bradycardia. Patient denies any heart racing, palpitations, SOB, or CP. He does have intermittent dizziness.  Today, he denies symptoms of palpitations, chest pain, shortness of breath, orthopnea, PND, presyncope, syncope, snoring, daytime somnolence, bleeding, or neurologic sequela. The patient is tolerating medications without difficulties and is otherwise without complaint today.    Atrial Fibrillation Risk Factors:  he does not have symptoms or diagnosis of sleep apnea.   he has a BMI of Body mass index is 24.95 kg/m.Marland Kitchen Filed Weights   03/25/19 1433  Weight: 70.1 kg    Family History  Problem Relation Age of Onset  . Cancer Mother        ?  Marland Kitchen Bone cancer Sister   . Diabetes Brother   . Kidney disease Sister   . Colon cancer Neg Hx      Atrial Fibrillation Management history:  Previous antiarrhythmic drugs: none Previous cardioversions: none Previous ablations: none CHADS2VASC score: 4 Anticoagulation history: Xarelto (renal dosing)   Past Medical History:  Diagnosis  Date  . Chronic kidney disease (CKD), stage III (moderate)   . Diabetes mellitus   . Diverticulosis   . Hard of hearing   . Hemorrhoids   . Hx of adenomatous colonic polyps 2006  . Hypertension   . Squamous cell carcinoma of hard palate ([place]) 2003   Past Surgical History:  Procedure Laterality Date  . CHOLECYSTECTOMY N/A 04/17/2014   Procedure: LAPAROSCOPIC CHOLECYSTECTOMY WITH INTRAOPERATIVE CHOLANGIOGRAM;  Surgeon: Erroll Luna, MD;  Location: [facility];  Service: General;  Laterality: N/A;  . COLONOSCOPY  11/02/2007  . MAXILLECTOMY Right 2003   SCCA alveolar ridge    Current Outpatient Medications  Medication Sig Dispense Refill  . ACCU-CHEK SOFTCLIX LANCETS lancets 100 each by Other route 3 (three) times daily. Use as instructed Dx:E11.22 100 each 3  . acetaminophen (TYLENOL) 500 MG tablet Take 500-1,000 mg by mouth 3 (three) times daily as needed for mild pain or headache. Usually takes 1 tablet, will take 2 if he has a headache.    . Blood Glucose Monitoring Suppl (ACCU-CHEK AVIVA PLUS) w/Device KIT 1 Units by Does not apply route as directed. Use as directed to check blood sugar. Dx:E11.22 1 kit 0  . EASY COMFORT PEN NEEDLES 31G X 5 MM MISC use as directed TO INJECT INSULIN TO CONTROL BLOOD SUGAR  11  . famotidine (PEPCID) 20 MG tablet Take 20 mg by mouth as needed for heartburn or indigestion.    Marland Kitchen glucose blood (ACCU-CHEK AVIVA) test strip Use to test blood sugar three times daily. Dx:E11.22 300 each 3  . HYDROcodone-acetaminophen (  NORCO) 7.5-325 MG tablet Take 1 tablet by mouth every 6 (six) hours as needed.    . Insulin Glargine, 2 Unit Dial, (TOUJEO MAX SOLOSTAR) 300 UNIT/ML SOPN Inject 15 Units into the skin every morning. 3 pen 0  . levofloxacin (LEVAQUIN) 250 MG tablet 2 tablets today, then one po daily x 14 days 16 tablet 0  . linagliptin (TRADJENTA) 5 MG TABS tablet Take 1 tablet (5 mg total) by mouth daily. 90 tablet 1  . methocarbamol (ROBAXIN) 500 MG tablet Take 1  tablet (500 mg total) by mouth every 6 (six) hours as needed for muscle spasms. 60 tablet 0  . metoprolol tartrate (LOPRESSOR) 25 MG tablet Take 1 tablet (25 mg total) by mouth 2 (two) times daily. 60 tablet 1  . Multiple Vitamins-Minerals (OCUVITE ADULT 50+) CAPS Take 1 capsule by mouth daily.    . Nutritional Supplements (HIGH-PROTEIN NUTRITIONAL SHAKE) LIQD Take by mouth as needed.    . pantoprazole (PROTONIX) 40 MG tablet TAKE 1 TABLET (40 MG TOTAL) BY MOUTH 2 (TWO) TIMES DAILY BEFORE A MEAL. 180 tablet 0  . Rivaroxaban (XARELTO) 15 MG TABS tablet Take 1 tablet (15 mg total) by mouth daily. 30 tablet 5  . saccharomyces boulardii (FLORASTOR) 250 MG capsule Take 1 capsule (250 mg total) by mouth 2 (two) times daily. 30 capsule 0  . tamsulosin (FLOMAX) 0.4 MG CAPS capsule Take 1 capsule (0.4 mg total) by mouth at bedtime. 90 capsule 1   No current facility-administered medications for this encounter.    No Known Allergies  Social History   Socioeconomic History  . Marital status: Married    Spouse name: Not on file  . Number of children: 5  . Years of education: Not on file  . Highest education level: Not on file  Occupational History  . Occupation: Retired  Tobacco Use  . Smoking status: Former Smoker    Types: Cigars    Quit date: 04/07/2000    Years since quitting: 18.9  . Smokeless tobacco: Never Used  Substance and Sexual Activity  . Alcohol use: Not Currently    Comment: socially  . Drug use: No  . Sexual activity: Never  Other Topics Concern  . Not on file  Social History Narrative   Retired, married   Social Determinants of Health   Financial Resource Strain:   . Difficulty of Paying Living Expenses: Not on file  Food Insecurity:   . Worried About Charity fundraiser in the Last Year: Not on file  . Ran Out of Food in the Last Year: Not on file  Transportation Needs:   . Lack of Transportation (Medical): Not on file  . Lack of Transportation (Non-Medical): Not  on file  Physical Activity:   . Days of Exercise per Week: Not on file  . Minutes of Exercise per Session: Not on file  Stress:   . Feeling of Stress : Not on file  Social Connections:   . Frequency of Communication with Friends and Family: Not on file  . Frequency of Social Gatherings with Friends and Family: Not on file  . Attends Religious Services: Not on file  . Active Member of Clubs or Organizations: Not on file  . Attends Archivist Meetings: Not on file  . Marital Status: Not on file  Intimate Partner Violence:   . Fear of Current or Ex-Partner: Not on file  . Emotionally Abused: Not on file  . Physically Abused: Not on file  .  Sexually Abused: Not on file     ROS- All systems are reviewed and negative except as per the HPI above.  Physical Exam: Vitals:   03/25/19 1433  BP: 118/68  Pulse: (!) 122  Weight: 70.1 kg  Height: 5' 6"  (1.676 m)    GEN- The patient is well appearing elderly male, alert and oriented x 3 today.   Head- normocephalic, atraumatic Eyes-  Sclera clear, conjunctiva pink Ears- hearing intact Oropharynx- clear Neck- supple  Lungs- Clear to ausculation bilaterally, normal work of breathing Heart- irregular rate and rhythm, no murmurs, rubs or gallops  GI- soft, NT, ND, + BS Extremities- no clubbing, cyanosis, 1+ edema RLE. MS- no significant deformity or atrophy Skin- no rash or lesion Psych- euthymic mood, full affect Neuro- strength and sensation are intact  Wt Readings from Last 3 Encounters:  03/25/19 70.1 kg  03/14/19 70.1 kg  03/10/19 70.1 kg    EKG today demonstrates afib HR 122, QRS 74, QTc 427  Epic records are reviewed at length today  CHA2DS2-VASc Score = 4 The patient's score is based upon: CHF History: No HTN History: Yes Age : 48 + Diabetes History: Yes Stroke History: No Vascular Disease History: No Gender: Male      ASSESSMENT AND PLAN: 1. Persistent Atrial Fibrillation (ICD10:  I48.19) The  patient's CHA2DS2-VASc score is 4, indicating a 4.8% annual risk of stroke.   General education about afib provided and questions answered. We also discussed his stroke risk and the risk and benefits of anticoagulation. ? Secondary to stress of surgery and recent infection. Continue Xarelto 15 mg daily (15 mg dose started 03/14/19) Increase metoprolol to 25 mg BID for rate control. If he remains persistent on higher dose of BB, will plan for DCCV after 3 weeks of uninterrupted anticoagulation. After discussing the risks and benefits with patient and family, they are in agreement with the plan. Check TSH/mag today. Will plan for echocardiogram once he is better rate controlled.  2. Secondary Hypercoagulable State (ICD10:  D68.69) The patient is at significant risk for stroke/thromboembolism based upon his CHA2DS2-VASc Score of 4.  Continue Rivaroxaban (Xarelto).    3. HTN Stable, med changes as above.   Follow up in the AF clinic later this week for repeat ECG.   [facility] [address] [place], [place] [address] [phone] 03/25/2019 3:13 PM

## 2019-03-26 ENCOUNTER — Telehealth: Payer: Self-pay | Admitting: Internal Medicine

## 2019-03-26 NOTE — Telephone Encounter (Signed)
Can you please schedule [name] a f/u appt with me for next thurs or fri?

## 2019-03-26 NOTE — Telephone Encounter (Signed)
-----   Message from Oliver Barre, [place] sent at 03/26/2019 10:57 AM EST ----- It is odd. Does he have an appointment with you also on 1/22? I only see his ECG follow up with me for that day. Thanks!  Ricky  ----- Message ----- From: Gayland Curry, DO Sent: 03/26/2019   9:41 AM EST To: Juluis Mire, RN, Oliver Barre, PA  Interesting.  Odd that both are elevated.  I wonder if it has to do with his acute illness rather than true thyroid disease.  We'll look into imaging his thyroid when I see him next  03/28/2019.   Thanks, TReed ----- Message ----- From: Oliver Barre, PA Sent: 03/26/2019   8:26 AM EST To: Gayland Curry, DO, Juluis Mire, RN  Mag normal, TSH and free T4 elevated. Please inform pt of result and have him follow up with his PCP. Will also forward results to PCP.

## 2019-03-28 ENCOUNTER — Other Ambulatory Visit: Payer: Self-pay

## 2019-03-28 ENCOUNTER — Ambulatory Visit (HOSPITAL_COMMUNITY)
Admission: RE | Admit: 2019-03-28 | Discharge: 2019-03-28 | Disposition: A | Discharge status: Home / Self Care | Payer: Medicare PPO | Source: Ambulatory Visit | Attending: Physician Assistant | Admitting: Physician Assistant

## 2019-03-28 DIAGNOSIS — I4891 Unspecified atrial fibrillation: Secondary | ICD-10-CM | POA: Insufficient documentation

## 2019-03-28 DIAGNOSIS — Z79899 Other long term (current) drug therapy: Secondary | ICD-10-CM | POA: Insufficient documentation

## 2019-03-28 DIAGNOSIS — I4892 Unspecified atrial flutter: Secondary | ICD-10-CM | POA: Diagnosis not present

## 2019-03-28 DIAGNOSIS — I4819 Other persistent atrial fibrillation: Secondary | ICD-10-CM

## 2019-03-28 DIAGNOSIS — R9431 Abnormal electrocardiogram [ECG] [EKG]: Secondary | ICD-10-CM | POA: Insufficient documentation

## 2019-03-28 DIAGNOSIS — Z01818 Encounter for other preprocedural examination: Secondary | ICD-10-CM | POA: Insufficient documentation

## 2019-03-28 LAB — BASIC METABOLIC PANEL
Anion gap: 12 (ref 5–15)
BUN: 29 mg/dL — ABNORMAL HIGH (ref 8–23)
CO2: 20 mmol/L — ABNORMAL LOW (ref 22–32)
Calcium: 9.2 mg/dL (ref 8.9–10.3)
Chloride: 106 mmol/L (ref 98–111)
Creatinine, Ser: 2.45 mg/dL — ABNORMAL HIGH (ref 0.61–1.24)
GFR calc Af Amer: 26 mL/min — ABNORMAL LOW (ref >60)
GFR calc non Af Amer: 23 mL/min — ABNORMAL LOW (ref >60)
Glucose, Bld: 127 mg/dL — ABNORMAL HIGH (ref 70–99)
Potassium: 4.7 mmol/L (ref 3.5–5.1)
Sodium: 138 mmol/L (ref 135–145)

## 2019-03-28 LAB — CBC
HCT: 35.7 % — ABNORMAL LOW (ref 39.0–52.0)
Hemoglobin: 10.8 g/dL — ABNORMAL LOW (ref 13.0–17.0)
MCH: 30.9 pg (ref 26.0–34.0)
MCHC: 30.3 g/dL (ref 30.0–36.0)
MCV: 102 fL — ABNORMAL HIGH (ref 80.0–100.0)
Platelets: 282 10*3/uL (ref 150–400)
RBC: 3.5 MIL/uL — ABNORMAL LOW (ref 4.22–5.81)
RDW: 15.8 % — ABNORMAL HIGH (ref 11.5–15.5)
WBC: 6 10*3/uL (ref 4.0–10.5)
nRBC: 0 % (ref 0.0–0.2)

## 2019-03-28 NOTE — Progress Notes (Signed)
Patient returns today for ECG after increasing metoprolol. He remains in afib with HR 122, QRS 70, QTc 407. Will plan for DCCV on 1/29 or later (3 weeks after starting Xarelto). Will plan to decrease metoprolol back to 12.5 mg BID the morning of the procedure. Bmet/CBC today. Follow up in AF clinic one week post DCCV.

## 2019-03-28 NOTE — H&P (View-Only) (Signed)
Patient returns today for ECG after increasing metoprolol. He remains in afib with HR 122, QRS 70, QTc 407. Will plan for DCCV on 1/29 or later (3 weeks after starting Xarelto). Will plan to decrease metoprolol back to 12.5 mg BID the morning of the procedure. Bmet/CBC today. Follow up in AF clinic one week post DCCV.

## 2019-03-28 NOTE — Patient Instructions (Addendum)
Cardioversion scheduled for Monday, February 1st  - Arrive at the [facility] and go to admitting at 1030AM  -Do not eat or drink anything after midnight the night prior to your procedure.  - Take all your morning medication (except diabetic medication) with a sip of water prior to arrival.  - You will not be able to drive home after your procedure.   Day of cardioversion go back to 1/2 tablet of metoprolol twice a day

## 2019-04-03 ENCOUNTER — Other Ambulatory Visit (HOSPITAL_COMMUNITY)
Admission: RE | Admit: 2019-04-03 | Discharge: 2019-04-03 | Disposition: A | Discharge status: Home / Self Care | Payer: Medicare PPO | Source: Ambulatory Visit | Attending: Cardiovascular Disease | Admitting: Cardiovascular Disease

## 2019-04-03 ENCOUNTER — Other Ambulatory Visit: Payer: Self-pay

## 2019-04-03 DIAGNOSIS — Z01812 Encounter for preprocedural laboratory examination: Secondary | ICD-10-CM | POA: Diagnosis present

## 2019-04-03 DIAGNOSIS — Z20822 Contact with and (suspected) exposure to covid-19: Secondary | ICD-10-CM | POA: Insufficient documentation

## 2019-04-03 LAB — SARS CORONAVIRUS 2 (TAT 6-24 HRS): SARS Coronavirus 2: NEGATIVE

## 2019-04-07 ENCOUNTER — Encounter (HOSPITAL_COMMUNITY): Payer: Self-pay | Admitting: Cardiovascular Disease

## 2019-04-07 ENCOUNTER — Ambulatory Visit (HOSPITAL_COMMUNITY): Payer: Medicare PPO

## 2019-04-07 ENCOUNTER — Ambulatory Visit (HOSPITAL_COMMUNITY)
Admission: RE | Admit: 2019-04-07 | Discharge: 2019-04-07 | Disposition: A | Discharge status: Home / Self Care | Payer: Medicare PPO | Attending: Cardiovascular Disease | Admitting: Cardiovascular Disease

## 2019-04-07 ENCOUNTER — Other Ambulatory Visit: Payer: Self-pay

## 2019-04-07 ENCOUNTER — Encounter (HOSPITAL_COMMUNITY)
Admission: RE | Disposition: A | Discharge status: Home / Self Care | Payer: Self-pay | Source: Home / Self Care | Attending: Cardiovascular Disease

## 2019-04-07 DIAGNOSIS — N183 Chronic kidney disease, stage 3 unspecified: Secondary | ICD-10-CM | POA: Insufficient documentation

## 2019-04-07 DIAGNOSIS — I4891 Unspecified atrial fibrillation: Secondary | ICD-10-CM | POA: Diagnosis not present

## 2019-04-07 DIAGNOSIS — Z7901 Long term (current) use of anticoagulants: Secondary | ICD-10-CM | POA: Insufficient documentation

## 2019-04-07 DIAGNOSIS — E1122 Type 2 diabetes mellitus with diabetic chronic kidney disease: Secondary | ICD-10-CM | POA: Insufficient documentation

## 2019-04-07 DIAGNOSIS — Z794 Long term (current) use of insulin: Secondary | ICD-10-CM | POA: Diagnosis not present

## 2019-04-07 DIAGNOSIS — I129 Hypertensive chronic kidney disease with stage 1 through stage 4 chronic kidney disease, or unspecified chronic kidney disease: Secondary | ICD-10-CM | POA: Insufficient documentation

## 2019-04-07 DIAGNOSIS — I4819 Other persistent atrial fibrillation: Secondary | ICD-10-CM | POA: Diagnosis not present

## 2019-04-07 DIAGNOSIS — Z79899 Other long term (current) drug therapy: Secondary | ICD-10-CM | POA: Insufficient documentation

## 2019-04-07 DIAGNOSIS — D6869 Other thrombophilia: Secondary | ICD-10-CM | POA: Insufficient documentation

## 2019-04-07 DIAGNOSIS — Z85818 Personal history of malignant neoplasm of other sites of lip, oral cavity, and pharynx: Secondary | ICD-10-CM | POA: Diagnosis not present

## 2019-04-07 DIAGNOSIS — N410 Acute prostatitis: Secondary | ICD-10-CM

## 2019-04-07 HISTORY — PX: CARDIOVERSION: SHX1299

## 2019-04-07 SURGERY — CARDIOVERSION
Anesthesia: General

## 2019-04-07 MED ORDER — LIDOCAINE 2% (20 MG/ML) 5 ML SYRINGE
INTRAMUSCULAR | Status: DC | PRN
  Administered 2019-04-07: 60 mg via INTRAVENOUS

## 2019-04-07 MED ORDER — SODIUM CHLORIDE 0.9 % IV SOLN: INTRAVENOUS | Status: DC

## 2019-04-07 MED ORDER — PROPOFOL 10 MG/ML IV BOLUS
INTRAVENOUS | Status: DC | PRN
  Administered 2019-04-07: 40 mg via INTRAVENOUS

## 2019-04-07 NOTE — Anesthesia Preprocedure Evaluation (Addendum)
Anesthesia Evaluation  Patient identified by MRN, date of birth, ID band Patient awake    Reviewed: Allergy & Precautions, NPO status , Patient's Chart, lab work & pertinent test results  History of Anesthesia Complications Negative for: history of anesthetic complications  Airway Mallampati: II  TM Distance: >3 FB Neck ROM: Full    Dental  (+) Edentulous Upper, Edentulous Lower   Pulmonary neg pulmonary ROS, former smoker,    Pulmonary exam normal        Cardiovascular hypertension, negative cardio ROS   Rhythm:Irregular Rate:Normal     Neuro/Psych negative neurological ROS  negative psych ROS   GI/Hepatic negative GI ROS, Neg liver ROS,   Endo/Other  negative endocrine ROSdiabetes  Renal/GU Renal InsufficiencyRenal disease  negative genitourinary   Musculoskeletal negative musculoskeletal ROS (+)   Abdominal   Peds  Hematology negative hematology ROS (+) anemia ,   Anesthesia Other Findings   Reproductive/Obstetrics                            Anesthesia Physical Anesthesia Plan  ASA: III  Anesthesia Plan: General   Post-op Pain Management:    Induction: Intravenous  PONV Risk Score and Plan: TIVA and Treatment may vary due to age or medical condition  Airway Management Planned: Mask  Additional Equipment: None  Intra-op Plan:   Post-operative Plan:   Informed Consent: I have reviewed the patients History and Physical, chart, labs and discussed the procedure including the risks, benefits and alternatives for the proposed anesthesia with the patient or authorized representative who has indicated his/her understanding and acceptance.       Plan Discussed with:   Anesthesia Plan Comments:         Anesthesia Quick Evaluation

## 2019-04-07 NOTE — Interval H&P Note (Signed)
History and Physical Interval Note:  04/07/2019 10:20 AM  Wesley Garcia  has presented today for surgery, with the diagnosis of AFIB.  The various methods of treatment have been discussed with the patient and family. After consideration of risks, benefits and other options for treatment, the patient has consented to  Procedure(s): CARDIOVERSION (N/A) as a surgical intervention.  The patient's history has been reviewed, patient examined, no change in status, stable for surgery.  I have reviewed the patient's chart and labs.  Questions were answered to the patient's satisfaction.     Jenkins Rouge

## 2019-04-07 NOTE — Anesthesia Postprocedure Evaluation (Signed)
Anesthesia Post Note  Patient: Wesley Garcia  Procedure(s) Performed: CARDIOVERSION (N/A )     Patient location during evaluation: Endoscopy Anesthesia Type: General Level of consciousness: awake and alert Pain management: pain level controlled Vital Signs Assessment: post-procedure vital signs reviewed and stable Respiratory status: spontaneous breathing, nonlabored ventilation and respiratory function stable Cardiovascular status: blood pressure returned to baseline and stable Postop Assessment: no apparent nausea or vomiting Anesthetic complications: no    Last Vitals:  Vitals:   04/07/19 1120 04/07/19 1130  BP: 132/65 134/77  Pulse: 85 88  Resp: (!) 25 (!) 23  Temp:    SpO2: 95% 98%    Last Pain:  Vitals:   04/07/19 1130  TempSrc:   PainSc: 0-No pain   Pain Goal:                   Lidia Collum

## 2019-04-07 NOTE — CV Procedure (Signed)
DCC: On Rx xarelto no missed doses per family Anesthesia:  Propofol  DCC x 1 150 J Converted from afib rate 138 to NSR rate 88 bpm  No immediate neurologic sequelae  Jenkins Rouge MD [facility]

## 2019-04-07 NOTE — Discharge Instructions (Signed)
Electrical Cardioversion Electrical cardioversion is the delivery of a jolt of electricity to restore a normal rhythm to the heart. A rhythm that is too fast or is not regular keeps the heart from pumping well. In this procedure, sticky patches or metal paddles are placed on the chest to deliver electricity to the heart from a device. This procedure may be done in an emergency if:  There is low or no blood pressure as a result of the heart rhythm.  Normal rhythm must be restored as fast as possible to protect the brain and heart from further damage.  It may save a life. This may also be a scheduled procedure for irregular or fast heart rhythms that are not immediately life-threatening. Tell a health care provider about:  Any allergies you have.  All medicines you are taking, including vitamins, herbs, eye drops, creams, and over-the-counter medicines.  Any problems you or family members have had with anesthetic medicines.  Any blood disorders you have.  Any surgeries you have had.  Any medical conditions you have.  Whether you are pregnant or may be pregnant. What are the risks? Generally, this is a safe procedure. However, problems may occur, including:  Allergic reactions to medicines.  A blood clot that breaks free and travels to other parts of your body.  The possible return of an abnormal heart rhythm within hours or days after the procedure.  Your heart stopping (cardiac arrest). This is rare. What happens before the procedure? Medicines  Your health care provider may have you start taking: ? Blood-thinning medicines (anticoagulants) so your blood does not clot as easily. ? Medicines to help stabilize your heart rate and rhythm.  Ask your health care provider about: ? Changing or stopping your regular medicines. This is especially important if you are taking diabetes medicines or blood thinners. ? Taking medicines such as aspirin and ibuprofen. These medicines can  thin your blood. Do not take these medicines unless your health care provider tells you to take them. ? Taking over-the-counter medicines, vitamins, herbs, and supplements. General instructions  Follow instructions from your health care provider about eating or drinking restrictions.  Plan to have someone take you home from the hospital or clinic.  If you will be going home right after the procedure, plan to have someone with you for 24 hours.  Ask your health care provider what steps will be taken to help prevent infection. These may include washing your skin with a germ-killing soap. What happens during the procedure?   An IV will be inserted into one of your veins.  Sticky patches (electrodes) or metal paddles may be placed on your chest.  You will be given a medicine to help you relax (sedative).  An electrical shock will be delivered. The procedure may vary among health care providers and hospitals. What can I expect after the procedure?  Your blood pressure, heart rate, breathing rate, and blood oxygen level will be monitored until you leave the hospital or clinic.  Your heart rhythm will be watched to make sure it does not change.  You may have some redness on the skin where the shocks were given. Follow these instructions at home:  Do not drive for 24 hours if you were given a sedative during your procedure.  Take over-the-counter and prescription medicines only as told by your health care provider.  Ask your health care provider how to check your pulse. Check it often.  Rest for 48 hours after the procedure or   as told by your health care provider.  Avoid or limit your caffeine use as told by your health care provider.  Keep all follow-up visits as told by your health care provider. This is important. Contact a health care provider if:  You feel like your heart is beating too quickly or your pulse is not regular.  You have a serious muscle cramp that does not go  away. Get help right away if:  You have discomfort in your chest.  You are dizzy or you feel faint.  You have trouble breathing or you are short of breath.  Your speech is slurred.  You have trouble moving an arm or leg on one side of your body.  Your fingers or toes turn cold or blue. Summary  Electrical cardioversion is the delivery of a jolt of electricity to restore a normal rhythm to the heart.  This procedure may be done right away in an emergency or may be a scheduled procedure if the condition is not an emergency.  Generally, this is a safe procedure.  After the procedure, check your pulse often as told by your health care provider. This information is not intended to replace advice given to you by your health care provider. Make sure you discuss any questions you have with your health care provider. Document Revised: 09/23/2018 Document Reviewed: 09/23/2018 Elsevier Patient Education  2020 [employer]  

## 2019-04-07 NOTE — Anesthesia Procedure Notes (Signed)
Date/Time: 04/07/2019 10:59 AM Performed by: Alain Marion, CRNA Pre-anesthesia Checklist: Patient identified, Emergency Drugs available, Suction available and Patient being monitored Patient Re-evaluated:Patient Re-evaluated prior to induction Oxygen Delivery Method: Ambu bag Preoxygenation: Pre-oxygenation with 100% oxygen Placement Confirmation: positive ETCO2

## 2019-04-07 NOTE — Transfer of Care (Signed)
Immediate Anesthesia Transfer of Care Note  Patient: Wesley Garcia  Procedure(s) Performed: CARDIOVERSION (N/A )  Patient Location: Endoscopy Unit  Anesthesia Type:General  Level of Consciousness: awake, alert  and oriented  Airway & Oxygen Therapy: Patient Spontanous Breathing  Post-op Assessment: Report given to RN and Post -op Vital signs reviewed and stable  Post vital signs: Reviewed and stable  Last Vitals:  Vitals Value Taken Time  BP 148/80   Temp    Pulse 95 04/07/19 1109  Resp 32 04/07/19 1109  SpO2 94 % 04/07/19 1109    Last Pain:  Vitals:   04/07/19 1045  TempSrc: Oral  PainSc: 0-No pain         Complications: No apparent anesthesia complications

## 2019-04-09 ENCOUNTER — Encounter: Payer: Self-pay | Admitting: *Deleted

## 2019-04-10 ENCOUNTER — Encounter: Payer: Self-pay | Admitting: Internal Medicine

## 2019-04-10 ENCOUNTER — Other Ambulatory Visit: Payer: Self-pay

## 2019-04-10 ENCOUNTER — Ambulatory Visit (INDEPENDENT_AMBULATORY_CARE_PROVIDER_SITE_OTHER): Payer: Medicare PPO | Admitting: Internal Medicine

## 2019-04-10 VITALS — BP 140/82 | HR 89 | Temp 97.8°F | Ht 66.0 in | Wt 155.0 lb

## 2019-04-10 DIAGNOSIS — N184 Chronic kidney disease, stage 4 (severe): Secondary | ICD-10-CM

## 2019-04-10 DIAGNOSIS — I499 Cardiac arrhythmia, unspecified: Secondary | ICD-10-CM

## 2019-04-10 DIAGNOSIS — I495 Sick sinus syndrome: Secondary | ICD-10-CM

## 2019-04-10 DIAGNOSIS — E1129 Type 2 diabetes mellitus with other diabetic kidney complication: Secondary | ICD-10-CM | POA: Diagnosis not present

## 2019-04-10 DIAGNOSIS — R809 Proteinuria, unspecified: Secondary | ICD-10-CM

## 2019-04-10 DIAGNOSIS — E1122 Type 2 diabetes mellitus with diabetic chronic kidney disease: Secondary | ICD-10-CM | POA: Diagnosis not present

## 2019-04-10 DIAGNOSIS — R7989 Other specified abnormal findings of blood chemistry: Secondary | ICD-10-CM

## 2019-04-10 DIAGNOSIS — Z8781 Personal history of (healed) traumatic fracture: Secondary | ICD-10-CM

## 2019-04-10 DIAGNOSIS — I4891 Unspecified atrial fibrillation: Secondary | ICD-10-CM | POA: Diagnosis not present

## 2019-04-10 DIAGNOSIS — Z66 Do not resuscitate: Secondary | ICD-10-CM

## 2019-04-10 DIAGNOSIS — Z794 Long term (current) use of insulin: Secondary | ICD-10-CM

## 2019-04-10 LAB — GLUCOSE, POCT (MANUAL RESULT ENTRY): POC Glucose: 319 mg/dl — AB (ref 70–99)

## 2019-04-10 MED ORDER — TOUJEO MAX SOLOSTAR 300 UNIT/ML ~~LOC~~ SOPN: 20.0000 [IU] | PEN_INJECTOR | Freq: Every morning | SUBCUTANEOUS | 3 refills | Status: DC

## 2019-04-10 MED ORDER — METOPROLOL TARTRATE 25 MG PO TABS: 25.0000 mg | ORAL_TABLET | Freq: Two times a day (BID) | ORAL | 1 refills | Status: DC

## 2019-04-10 NOTE — Progress Notes (Signed)
Location:  [facility] clinic Provider:  Gesenia Bantz L. Mariea Clonts, D.O., C.M.D.  Code Status: DNR Goals of Care:  Advanced Directives 04/10/2019  Does Patient Have a Medical Advance Directive? Yes  Type of Advance Directive Out of facility DNR (pink MOST or yellow form)  Does patient want to make changes to medical advance directive? No - Patient declined  Copy of [place] in Chart? -  Would patient like information on creating a medical advance directive? -  Pre-existing out of facility DNR order (yellow form or pink MOST form) -   Chief Complaint  Patient presents with  . Hospitalization Follow-up    HPI: Patient is a 86 y.o. male seen today for f/u on abnormal labs and s/p cardioversion for afib.    He's not sleeping at night.    TSH slightly high and free T4 also elevated.  He now says he has trouble swallowing.    He does not have an appetite.  Says yesterday he at 1/2 banana twice a day.  Today, he had 1/2 banana, coffee and applesauce and now a hamburger for lunch.  He's been telling his daughter that he's been craving BBQ.  He's not remembering well.  Gaynell made some stirfry that was really good and then he ate too much.  He ate it again the next day.    CBG today was 319 after lunch.    He's off hydrocodone and muscle relaxer for over a week.  He's now tremulous.    Past Medical History:  Diagnosis Date  . Chronic kidney disease (CKD), stage III (moderate)   . Diabetes mellitus   . Diverticulosis   . Hard of hearing   . Hemorrhoids   . Hx of adenomatous colonic polyps 2006  . Hypertension   . 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 CARDIOVERSION N/A 04/07/2019   Procedure: CARDIOVERSION;  Surgeon: Josue Hector, MD;  Location: [facility] ENDOSCOPY;  Service: Cardiovascular;  Laterality: N/A;  . CHOLECYSTECTOMY N/A 04/17/2014   Procedure: LAPAROSCOPIC CHOLECYSTECTOMY WITH INTRAOPERATIVE  CHOLANGIOGRAM;  Surgeon: Erroll Luna, MD;  Location: [facility];  Service: General;  Laterality: N/A;  . COLONOSCOPY  11/02/2007  . MAXILLECTOMY Right 2003   SCCA alveolar ridge    No Known Allergies  Outpatient Encounter Medications as of 04/10/2019  Medication Sig  . ACCU-CHEK SOFTCLIX LANCETS lancets 100 each by Other route 3 (three) times daily. Use as instructed Dx:E11.22  . acetaminophen (TYLENOL) 500 MG tablet Take 500 mg by mouth every 6 (six) hours as needed for moderate pain.  Marland Kitchen amLODipine (NORVASC) 2.5 MG tablet Take 2.5 mg by mouth daily.  . Blood Glucose Monitoring Suppl (ACCU-CHEK AVIVA PLUS) w/Device KIT 1 Units by Does not apply route as directed. Use as directed to check blood sugar. Dx:E11.22  . EASY COMFORT PEN NEEDLES 31G X 5 MM MISC use as directed TO INJECT INSULIN TO CONTROL BLOOD SUGAR  . glucose blood (ACCU-CHEK AVIVA) test strip Use to test blood sugar three times daily. Dx:E11.22  . Insulin Glargine, 2 Unit Dial, (TOUJEO MAX SOLOSTAR) 300 UNIT/ML SOPN Inject 15 Units into the skin every morning. (Patient taking differently: Inject 20 Units into the skin every morning. )  . linagliptin (TRADJENTA) 5 MG TABS tablet Take 1 tablet (5 mg total) by mouth daily.  . metoprolol tartrate (LOPRESSOR) 25 MG tablet Take 1 tablet (25 mg total) by mouth 2 (two) times  daily. (Patient taking differently: Take 12.5 mg by mouth 2 (two) times daily. )  . Multiple Vitamins-Minerals (OCUVITE ADULT 50+) CAPS Take 1 capsule by mouth daily.  . pantoprazole (PROTONIX) 40 MG tablet TAKE 1 TABLET (40 MG TOTAL) BY MOUTH 2 (TWO) TIMES DAILY BEFORE A MEAL. (Patient taking differently: Take 40 mg by mouth daily. )  . Rivaroxaban (XARELTO) 15 MG TABS tablet Take 1 tablet (15 mg total) by mouth daily.  . tamsulosin (FLOMAX) 0.4 MG CAPS capsule Take 1 capsule (0.4 mg total) by mouth at bedtime.  . [DISCONTINUED] HYDROcodone-acetaminophen (NORCO) 7.5-325 MG tablet Take 1 tablet by mouth at bedtime.   .  [DISCONTINUED] methocarbamol (ROBAXIN) 500 MG tablet Take 1 tablet (500 mg total) by mouth every 6 (six) hours as needed for muscle spasms.   No facility-administered encounter medications on file as of 04/10/2019.    Review of Systems:  Review of Systems  Constitutional: Negative for chills, fever and malaise/fatigue.  HENT: Negative for congestion and sore throat.   Eyes: Negative for blurred vision.  Respiratory: Negative for cough and shortness of breath.   Cardiovascular: Positive for palpitations. Negative for chest pain and leg swelling.  Gastrointestinal: Negative for abdominal pain, blood in stool, constipation, diarrhea and melena.  Genitourinary: Negative for dysuria.  Musculoskeletal: Positive for joint pain. Negative for falls.       Right hip and knee  Skin: Negative for itching and rash.  Neurological: Positive for tremors. Negative for dizziness and loss of consciousness.  Endo/Heme/Allergies: Bruises/bleeds easily.  Psychiatric/Behavioral: Positive for depression. The patient has insomnia. The patient is not nervous/anxious.     Health Maintenance  Topic Date Due  . OPHTHALMOLOGY EXAM  03/06/2019  . HEMOGLOBIN A1C  04/03/2019  . URINE MICROALBUMIN  05/03/2019  . FOOT EXAM  01/23/2020  . TETANUS/TDAP  04/16/2021  . INFLUENZA VACCINE  Completed  . PNA vac Low Risk Adult  Completed    Physical Exam: Vitals:   04/10/19 1304  BP: 140/82  Pulse: 89  Temp: 97.8 F (36.6 C)  SpO2: 96%  Weight: 155 lb (70.3 kg)  Height: 5' 6"  (1.676 m)   Body mass index is 25.02 kg/m. Physical Exam Vitals reviewed.  Constitutional:      General: He is not in acute distress.    Appearance: Normal appearance. He is not toxic-appearing.  HENT:     Head: Normocephalic and atraumatic.  Cardiovascular:     Rate and Rhythm: Tachycardia present. Rhythm irregular.     Heart sounds: No murmur.  Pulmonary:     Effort: Pulmonary effort is normal.     Breath sounds: Normal breath  sounds. No rales.  Abdominal:     General: Bowel sounds are normal. There is no distension.     Palpations: Abdomen is soft.     Tenderness: There is no abdominal tenderness. There is no guarding or rebound.  Musculoskeletal:        General: Tenderness present.     Right lower leg: No edema.     Left lower leg: No edema.     Comments: Right hip and knee  Skin:    General: Skin is warm and dry.     Comments: Bottom healing  Neurological:     Mental Status: He is alert.     Cranial Nerves: No cranial nerve deficit.     Motor: Weakness present.     Gait: Gait abnormal.     Comments: Rapid tremor of hands  Psychiatric:  Comments: Down today     Labs reviewed: Basic Metabolic Panel: Recent Labs    10/01/18 0848 03/10/19 1342 03/25/19 1522 03/28/19 1137  NA 140 135  --  138  K 4.5 4.4  --  4.7  CL 106 100  --  106  CO2 25 19*  --  20*  GLUCOSE 107* 179*  --  127*  BUN 35* 50*  --  29*  CREATININE 2.10* 2.44*  --  2.45*  CALCIUM 9.6 9.0  --  9.2  MG  --   --  2.1  --   TSH  --   --  4.687*  --    Liver Function Tests: Recent Labs    05/02/18 0906 10/01/18 0848  AST 16 15  ALT 11 12  BILITOT 0.7 0.6  PROT 7.1 7.0   No results for input(s): LIPASE, AMYLASE in the last 8760 hours. No results for input(s): AMMONIA in the last 8760 hours. CBC: Recent Labs    05/02/18 0906 05/02/18 0906 10/01/18 0848 03/10/19 1342 03/28/19 1137  WBC 8.0   < > 9.5 22.1* 6.0  NEUTROABS 5,600  --  6,251 19,360*  --   HGB 14.7   < > 14.3 10.1* 10.8*  HCT 42.4   < > 41.6 30.7* 35.7*  MCV 92.4   < > 92.9 95.6 102.0*  PLT 223   < > 204 561* 282   < > = values in this interval not displayed.   Lipid Panel: Recent Labs    05/02/18 0906 10/01/18 0848  CHOL 95 86  HDL 53 47  LDLCALC 30 25  TRIG 41 49  CHOLHDL 1.8 1.8   Lab Results  Component Value Date   HGBA1C 7.4 (H) 10/01/2018    Procedures since last visit: Cardioversion   Assessment/Plan 1. DNR (do not  resuscitate) -order reentered per his request - Do not attempt resuscitation (DNR)  2. Controlled type 2 diabetes mellitus with microalbuminuria, without long-term current use of insulin (HCC) - control better lately, but I'm concerned about lows with poor intake now that he's back home with his wife in [place] not staying with his daughter who was making sure he ate well - POC Glucose (CBG) - Insulin Glargine, 2 Unit Dial, (TOUJEO MAX SOLOSTAR) 300 UNIT/ML SOPN; Inject 20 Units into the skin every morning. (Patient taking differently: Inject 15 Units into the skin every morning. )  Dispense: 6 pen; Refill: 3  3. New onset atrial fibrillation (HCC) -back in rapid afib after cardioversion--advised to go back to 66m po bid lopressor  4. Irregular heart beat -due to #3  5. Tachycardia-bradycardia syndrome ([facility] - has struggled with this so I'm concerned with being too aggressive with beta blocker--continue with short-acting -keep f/u with cardiology as planned -f/u thyroid labs b/c they were abnormal but did not really make sense  - TSH - T4, free  6. Controlled type 2 diabetes mellitus with stage 4 chronic kidney disease, with long-term current use of insulin (HCC) - renal function has improved considerably as has anemia postop  - Basic metabolic panel - Hemoglobin A1c  7. Abnormal thyroid blood test - repeat b/c they are likely off due to his acute illness rather than a true thyroid problem; if remain abnormal, will image thyroid but I didn't feel any masses/nodules - TSH - T4, free  8. S/P right hip fracture -continuing to gradually recover--really struggling due to his afib taking all of his energy - CBC with Differential/Platelet--repeat  Labs/tests ordered:   Orders Placed This Encounter  Procedures  . Basic metabolic panel    Order Specific Question:   Has the patient fasted?    Answer:   Yes  . Hemoglobin A1c  . TSH  . T4, free  . CBC with Differential/Platelet  .  Do not attempt resuscitation (DNR)    Order Specific Question:   In the event of cardiac or respiratory ARREST    Answer:   Do not call a "code blue"    Order Specific Question:   In the event of cardiac or respiratory ARREST    Answer:   Do not perform Intubation, CPR, defibrillation or ACLS    Order Specific Question:   In the event of cardiac or respiratory ARREST    Answer:   Use medication by any route, position, wound care, and other measures to relive pain and suffering. May use oxygen, suction and manual treatment of airway obstruction as needed for comfort.  Marland Kitchen POC Glucose (CBG)    Next appt:  Was supposed to schedule 6 week f/u with me, but left w/o scheduling.   Shirlean Berman L. Pamelia Botto, D.O. [facility] [address], [place] [address] Cell Phone (Mon-Fri 8am-5pm):  [phone] On Call:  [phone] & follow prompts after 5pm & weekends Office Phone:  [phone] Office Fax:  [phone]

## 2019-04-10 NOTE — Patient Instructions (Addendum)
Increase metoprolol to 25mg  twice daily waiting on seeing cardiology again.    I'm ordering a thyroid ultrasound due to your abnormal thyroid tests.  I sent toujeo to the [facility] up with your exercises.

## 2019-04-11 ENCOUNTER — Telehealth: Payer: Self-pay | Admitting: Internal Medicine

## 2019-04-11 LAB — BASIC METABOLIC PANEL
BUN/Creatinine Ratio: 12 (calc) (ref 6–22)
BUN: 24 mg/dL (ref 7–25)
CO2: 21 mmol/L (ref 20–32)
Calcium: 9.1 mg/dL (ref 8.6–10.3)
Chloride: 107 mmol/L (ref 98–110)
Creat: 1.94 mg/dL — ABNORMAL HIGH (ref 0.70–1.11)
Glucose, Bld: 216 mg/dL — ABNORMAL HIGH (ref 65–139)
Potassium: 4.6 mmol/L (ref 3.5–5.3)
Sodium: 140 mmol/L (ref 135–146)

## 2019-04-11 LAB — CBC WITH DIFFERENTIAL/PLATELET
Absolute Monocytes: 839 cells/uL (ref 200–950)
Basophils Absolute: 52 cells/uL (ref 0–200)
Basophils Relative: 0.8 %
Eosinophils Absolute: 72 cells/uL (ref 15–500)
Eosinophils Relative: 1.1 %
HCT: 37.9 % — ABNORMAL LOW (ref 38.5–50.0)
Hemoglobin: 12.1 g/dL — ABNORMAL LOW (ref 13.2–17.1)
Lymphs Abs: 858 cells/uL (ref 850–3900)
MCH: 29.6 pg (ref 27.0–33.0)
MCHC: 31.9 g/dL — ABNORMAL LOW (ref 32.0–36.0)
MCV: 92.7 fL (ref 80.0–100.0)
MPV: 9 fL (ref 7.5–12.5)
Monocytes Relative: 12.9 %
Neutro Abs: 4680 cells/uL (ref 1500–7800)
Neutrophils Relative %: 72 %
Platelets: 316 10*3/uL (ref 140–400)
RBC: 4.09 10*6/uL — ABNORMAL LOW (ref 4.20–5.80)
RDW: 14.2 % (ref 11.0–15.0)
Total Lymphocyte: 13.2 %
WBC: 6.5 10*3/uL (ref 3.8–10.8)

## 2019-04-11 LAB — HEMOGLOBIN A1C
Hgb A1c MFr Bld: 6.9 % of total Hgb — ABNORMAL HIGH (ref <5.7)
Mean Plasma Glucose: 151 (calc)
eAG (mmol/L): 8.4 (calc)

## 2019-04-11 LAB — T4, FREE: Free T4: 1.6 ng/dL (ref 0.8–1.8)

## 2019-04-11 LAB — TSH: TSH: 4.06 mIU/L (ref 0.40–4.50)

## 2019-04-11 NOTE — Telephone Encounter (Signed)
pts daughter(Teresa) called to ck on scheduling for thyroid US. Dr Mariea Clonts cx that [place] bc labs are ok & [place] is not needed & needs CMA to explain results.  Please call Thanks, Lattie Haw

## 2019-04-11 NOTE — Progress Notes (Signed)
Please inform his daughter: Kidney function has improved back to baseline.  Drinking water is crucial. Sugar average is much better 7.4 down to 6.9.  Unfortunately, much of this is probably due to his poor intake.   Thyroid tests are now normal so the abnormalities seen when cardiology tested him must have been due to his acute illness.  I will cancel the thyroid ultrasound also b/c I don't think his thyroid is causing any swallowing problem he's having with pills.   Anemia is getting much better, too, so when heart is in rhythm, he should start feeling better.

## 2019-04-14 ENCOUNTER — Ambulatory Visit (HOSPITAL_COMMUNITY)
Admission: RE | Admit: 2019-04-14 | Discharge: 2019-04-14 | Disposition: A | Discharge status: Home / Self Care | Payer: Medicare PPO | Source: Ambulatory Visit | Attending: Physician Assistant | Admitting: Physician Assistant

## 2019-04-14 ENCOUNTER — Other Ambulatory Visit: Payer: Self-pay

## 2019-04-14 VITALS — BP 126/66 | HR 143 | Ht 66.0 in | Wt 155.0 lb

## 2019-04-14 DIAGNOSIS — Z85818 Personal history of malignant neoplasm of other sites of lip, oral cavity, and pharynx: Secondary | ICD-10-CM | POA: Insufficient documentation

## 2019-04-14 DIAGNOSIS — Z87891 Personal history of nicotine dependence: Secondary | ICD-10-CM | POA: Insufficient documentation

## 2019-04-14 DIAGNOSIS — N183 Chronic kidney disease, stage 3 unspecified: Secondary | ICD-10-CM | POA: Insufficient documentation

## 2019-04-14 DIAGNOSIS — I495 Sick sinus syndrome: Secondary | ICD-10-CM | POA: Diagnosis not present

## 2019-04-14 DIAGNOSIS — D6869 Other thrombophilia: Secondary | ICD-10-CM | POA: Diagnosis not present

## 2019-04-14 DIAGNOSIS — Z833 Family history of diabetes mellitus: Secondary | ICD-10-CM | POA: Diagnosis not present

## 2019-04-14 DIAGNOSIS — E1122 Type 2 diabetes mellitus with diabetic chronic kidney disease: Secondary | ICD-10-CM | POA: Diagnosis not present

## 2019-04-14 DIAGNOSIS — I4819 Other persistent atrial fibrillation: Secondary | ICD-10-CM

## 2019-04-14 DIAGNOSIS — I129 Hypertensive chronic kidney disease with stage 1 through stage 4 chronic kidney disease, or unspecified chronic kidney disease: Secondary | ICD-10-CM | POA: Diagnosis not present

## 2019-04-14 DIAGNOSIS — Z79899 Other long term (current) drug therapy: Secondary | ICD-10-CM | POA: Diagnosis not present

## 2019-04-14 DIAGNOSIS — Z794 Long term (current) use of insulin: Secondary | ICD-10-CM | POA: Insufficient documentation

## 2019-04-14 DIAGNOSIS — Z7901 Long term (current) use of anticoagulants: Secondary | ICD-10-CM | POA: Diagnosis not present

## 2019-04-14 MED ORDER — METOPROLOL TARTRATE 25 MG PO TABS: 37.5000 mg | ORAL_TABLET | Freq: Two times a day (BID) | ORAL | 1 refills | Status: DC

## 2019-04-14 MED ORDER — AMIODARONE HCL 200 MG PO TABS: ORAL_TABLET | ORAL | 0 refills | Status: DC

## 2019-04-14 MED ORDER — FUROSEMIDE 20 MG PO TABS: ORAL_TABLET | ORAL | 1 refills | Status: DC

## 2019-04-14 NOTE — Patient Instructions (Signed)
Stop amlodipine  Start Amiodarone 200mg  twice a day for 1 month then reduce 1 tablet a day with food  Start Lasix 20mg  once a day for 3 days then only as needed for weight gain/swelling  Increase metoprolol to 37.5mg  twice a day (1 and 1/2 tablets twice a day)

## 2019-04-14 NOTE — Progress Notes (Signed)
Primary Care Physician: Gayland Curry, DO Primary Cardiologist: none Primary Electrophysiologist: none Referring Physician: Dr Larena Glassman is a 86 y.o. male with a history of CKD, DM, HTN, squamous cell carcinoma of hard palate, fall with hip fracture s/p surgical repair 02/27/19, and persistent atrial fibrillation who presents for follow up in the [facility].  The patient was initially diagnosed with atrial fibrillation on 03/14/19 after presenting to his PCP office after an abnormal ECG by home health. ECG confirmed afib with RVR. His Xarelto was increased to 15 mg daily for a CHADS2VASC score of 4. He was also started on Lopressor for rate control. On follow up with PCP 03/24/19 patient was still in afib with RVR with symptoms of fatigue.   On follow up today, patient is s/p DCCV on 04/07/19. Patient reports that he initially felt better for a few days after his DCCV with less SOB. Unfortunately, on follow up with his PCP on 04/10/19, he was back in afib with RVR with symptoms of SOB and palpitations. His BB was again increased. Patient also noted bilateral leg edema.  Today, he denies symptoms of orthopnea, PND, presyncope, syncope, snoring, daytime somnolence, bleeding, or neurologic sequela. The patient is tolerating medications without difficulties and is otherwise without complaint today.    Atrial Fibrillation Risk Factors:  he does not have symptoms or diagnosis of sleep apnea.   he has a BMI of Body mass index is 25.02 kg/m.Marland Kitchen Filed Weights   04/14/19 1415  Weight: 70.3 kg    Family History  Problem Relation Age of Onset  . Cancer Mother        ?  Marland Kitchen Bone cancer Sister   . Diabetes Brother   . Kidney disease Sister   . Colon cancer Neg Hx      Atrial Fibrillation Management history:  Previous antiarrhythmic drugs: none Previous cardioversions: none Previous ablations: none CHADS2VASC score: 4 Anticoagulation history: Xarelto  (renal dosing)   Past Medical History:  Diagnosis Date  . Chronic kidney disease (CKD), stage III (moderate)   . Diabetes mellitus   . Diverticulosis   . Hard of hearing   . Hemorrhoids   . Hx of adenomatous colonic polyps 2006  . Hypertension   . 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 CARDIOVERSION N/A 04/07/2019   Procedure: CARDIOVERSION;  Surgeon: Josue Hector, MD;  Location: [facility] ENDOSCOPY;  Service: Cardiovascular;  Laterality: N/A;  . CHOLECYSTECTOMY N/A 04/17/2014   Procedure: LAPAROSCOPIC CHOLECYSTECTOMY WITH INTRAOPERATIVE CHOLANGIOGRAM;  Surgeon: Erroll Luna, MD;  Location: [facility];  Service: General;  Laterality: N/A;  . COLONOSCOPY  11/02/2007  . MAXILLECTOMY Right 2003   SCCA alveolar ridge    Current Outpatient Medications  Medication Sig Dispense Refill  . ACCU-CHEK SOFTCLIX LANCETS lancets 100 each by Other route 3 (three) times daily. Use as instructed Dx:E11.22 100 each 3  . acetaminophen (TYLENOL) 500 MG tablet Take 500 mg by mouth every 6 (six) hours as needed for moderate pain.    Marland Kitchen amLODipine (NORVASC) 2.5 MG tablet Take 2.5 mg by mouth daily.    . Blood Glucose Monitoring Suppl (ACCU-CHEK AVIVA PLUS) w/Device KIT 1 Units by Does not apply route as directed. Use as directed to check blood sugar. Dx:E11.22 1 kit 0  . EASY COMFORT PEN NEEDLES 31G X 5 MM MISC use as directed TO INJECT INSULIN TO CONTROL  BLOOD SUGAR  11  . glucose blood (ACCU-CHEK AVIVA) test strip Use to test blood sugar three times daily. Dx:E11.22 300 each 3  . Insulin Glargine, 2 Unit Dial, (TOUJEO MAX SOLOSTAR) 300 UNIT/ML SOPN Inject 20 Units into the skin every morning. (Patient taking differently: Inject 15 Units into the skin every morning. ) 6 pen 3  . linagliptin (TRADJENTA) 5 MG TABS tablet Take 1 tablet (5 mg total) by mouth daily. 90 tablet 1  . metoprolol tartrate (LOPRESSOR) 25 MG tablet Take 1 tablet (25 mg total) by  mouth 2 (two) times daily. 60 tablet 1  . Multiple Vitamins-Minerals (OCUVITE ADULT 50+) CAPS Take 1 capsule by mouth daily.    . pantoprazole (PROTONIX) 40 MG tablet TAKE 1 TABLET (40 MG TOTAL) BY MOUTH 2 (TWO) TIMES DAILY BEFORE A MEAL. (Patient taking differently: Take 40 mg by mouth daily. ) 180 tablet 0  . Rivaroxaban (XARELTO) 15 MG TABS tablet Take 1 tablet (15 mg total) by mouth daily. 30 tablet 5  . tamsulosin (FLOMAX) 0.4 MG CAPS capsule Take 1 capsule (0.4 mg total) by mouth at bedtime. 90 capsule 1   No current facility-administered medications for this encounter.    No Known Allergies  Social History   Socioeconomic History  . Marital status: Married    Spouse name: Not on file  . Number of children: 5  . Years of education: Not on file  . Highest education level: Not on file  Occupational History  . Occupation: Retired  Tobacco Use  . Smoking status: Former Smoker    Types: Cigars    Quit date: 04/07/2000    Years since quitting: 19.0  . Smokeless tobacco: Never Used  Substance and Sexual Activity  . Alcohol use: Not Currently    Comment: socially  . Drug use: No  . Sexual activity: Never  Other Topics Concern  . Not on file  Social History Narrative   Retired, married   Social Determinants of Health   Financial Resource Strain:   . Difficulty of Paying Living Expenses: Not on file  Food Insecurity:   . Worried About Charity fundraiser in the Last Year: Not on file  . Ran Out of Food in the Last Year: Not on file  Transportation Needs:   . Lack of Transportation (Medical): Not on file  . Lack of Transportation (Non-Medical): Not on file  Physical Activity:   . Days of Exercise per Week: Not on file  . Minutes of Exercise per Session: Not on file  Stress:   . Feeling of Stress : Not on file  Social Connections:   . Frequency of Communication with Friends and Family: Not on file  . Frequency of Social Gatherings with Friends and Family: Not on file  .  Attends Religious Services: Not on file  . Active Member of Clubs or Organizations: Not on file  . Attends Archivist Meetings: Not on file  . Marital Status: Not on file  Intimate Partner Violence:   . Fear of Current or Ex-Partner: Not on file  . Emotionally Abused: Not on file  . Physically Abused: Not on file  . Sexually Abused: Not on file     ROS- All systems are reviewed and negative except as per the HPI above.  Physical Exam: Vitals:   04/14/19 1415  BP: 126/66  Pulse: (!) 143  SpO2: 98%  Weight: 70.3 kg  Height: 5' 6"  (1.676 m)    GEN- The  patient is well appearing elderly male, alert and oriented x 3 today.   HEENT-head normocephalic, atraumatic, sclera clear, conjunctiva pink, hearing intact, trachea midline. Lungs- Clear to ausculation bilaterally, normal work of breathing Heart- irregular rate and rhythm, tachycardia, no murmurs, rubs or gallops  GI- soft, NT, ND, + BS Extremities- no clubbing, cyanosis. 1+ bilateral edema MS- no significant deformity or atrophy Skin- no rash or lesion Psych- euthymic mood, full affect Neuro- strength and sensation are intact   Wt Readings from Last 3 Encounters:  04/14/19 70.3 kg  04/10/19 70.3 kg  04/07/19 70.1 kg    EKG today demonstrates afib HR 143, old septal inf, QRS 68, QTc 441  Epic records are reviewed at length today  CHA2DS2-VASc Score = 4 The patient's score is based upon: CHF History: No HTN History: Yes Age : 43 + Diabetes History: Yes Stroke History: No Vascular Disease History: No Gender: Male     ASSESSMENT AND PLAN: 1. Persistent Atrial Fibrillation (ICD10:  I48.19) The patient's CHA2DS2-VASc score is 4, indicating a 4.8% annual risk of stroke.   S/p DCCV on 04/07/19. Unfortunately he is back in rapid afib. After discussing the risks and benefits, will plan to start amiodarone 200 mg BID for 4 weeks then decrease to 200 mg daily. Continue Xarelto 15 mg daily  Increase metoprolol  to 37.5 mg BID. Stop amlodipine 2/2 need for increased rate control. Check echocardiogram Start Lasix 20 mg daily x3 days then PRN for swelling/weight gain. ER precautions given.  2. Secondary Hypercoagulable State (ICD10:  D68.69) The patient is at significant risk for stroke/thromboembolism based upon his CHA2DS2-VASc Score of 4.  Continue Rivaroxaban (Xarelto).    3. HTN Stable, med changes as above.   Follow up in the AF clinic in one week.   [facility] [address] [place], [place] [address] [phone] 04/14/2019 2:30 PM

## 2019-04-15 NOTE — Telephone Encounter (Signed)
Per Dr. Mariea Clonts thyroid ultrasound is canceled.

## 2019-04-21 ENCOUNTER — Ambulatory Visit (HOSPITAL_BASED_OUTPATIENT_CLINIC_OR_DEPARTMENT_OTHER)
Admission: RE | Admit: 2019-04-21 | Discharge: 2019-04-21 | Disposition: A | Discharge status: Home / Self Care | Payer: Medicare PPO | Source: Ambulatory Visit | Attending: Physician Assistant | Admitting: Physician Assistant

## 2019-04-21 ENCOUNTER — Ambulatory Visit (HOSPITAL_COMMUNITY)
Admission: RE | Admit: 2019-04-21 | Discharge: 2019-04-21 | Disposition: A | Discharge status: Home / Self Care | Payer: Medicare PPO | Source: Ambulatory Visit | Attending: Physician Assistant | Admitting: Physician Assistant

## 2019-04-21 ENCOUNTER — Other Ambulatory Visit: Payer: Self-pay

## 2019-04-21 VITALS — BP 132/70 | HR 114 | Ht 66.0 in | Wt 155.0 lb

## 2019-04-21 DIAGNOSIS — I129 Hypertensive chronic kidney disease with stage 1 through stage 4 chronic kidney disease, or unspecified chronic kidney disease: Secondary | ICD-10-CM | POA: Insufficient documentation

## 2019-04-21 DIAGNOSIS — I083 Combined rheumatic disorders of mitral, aortic and tricuspid valves: Secondary | ICD-10-CM | POA: Diagnosis not present

## 2019-04-21 DIAGNOSIS — N183 Chronic kidney disease, stage 3 unspecified: Secondary | ICD-10-CM | POA: Diagnosis not present

## 2019-04-21 DIAGNOSIS — Z808 Family history of malignant neoplasm of other organs or systems: Secondary | ICD-10-CM | POA: Diagnosis not present

## 2019-04-21 DIAGNOSIS — I4819 Other persistent atrial fibrillation: Secondary | ICD-10-CM

## 2019-04-21 DIAGNOSIS — Z79899 Other long term (current) drug therapy: Secondary | ICD-10-CM | POA: Insufficient documentation

## 2019-04-21 DIAGNOSIS — Z85828 Personal history of other malignant neoplasm of skin: Secondary | ICD-10-CM | POA: Diagnosis not present

## 2019-04-21 DIAGNOSIS — Z794 Long term (current) use of insulin: Secondary | ICD-10-CM | POA: Insufficient documentation

## 2019-04-21 DIAGNOSIS — Z9049 Acquired absence of other specified parts of digestive tract: Secondary | ICD-10-CM | POA: Diagnosis not present

## 2019-04-21 DIAGNOSIS — Z7901 Long term (current) use of anticoagulants: Secondary | ICD-10-CM | POA: Insufficient documentation

## 2019-04-21 DIAGNOSIS — Z87891 Personal history of nicotine dependence: Secondary | ICD-10-CM | POA: Insufficient documentation

## 2019-04-21 DIAGNOSIS — E1122 Type 2 diabetes mellitus with diabetic chronic kidney disease: Secondary | ICD-10-CM | POA: Diagnosis not present

## 2019-04-21 DIAGNOSIS — Z833 Family history of diabetes mellitus: Secondary | ICD-10-CM | POA: Diagnosis not present

## 2019-04-21 DIAGNOSIS — E785 Hyperlipidemia, unspecified: Secondary | ICD-10-CM | POA: Diagnosis not present

## 2019-04-21 DIAGNOSIS — R9431 Abnormal electrocardiogram [ECG] [EKG]: Secondary | ICD-10-CM | POA: Diagnosis not present

## 2019-04-21 DIAGNOSIS — I495 Sick sinus syndrome: Secondary | ICD-10-CM

## 2019-04-21 DIAGNOSIS — Z841 Family history of disorders of kidney and ureter: Secondary | ICD-10-CM | POA: Insufficient documentation

## 2019-04-21 DIAGNOSIS — Z809 Family history of malignant neoplasm, unspecified: Secondary | ICD-10-CM | POA: Insufficient documentation

## 2019-04-21 DIAGNOSIS — D6869 Other thrombophilia: Secondary | ICD-10-CM | POA: Diagnosis not present

## 2019-04-21 LAB — ECHOCARDIOGRAM COMPLETE
Height: 66 in
Weight: 2480 [oz_av]

## 2019-04-21 MED ORDER — METOPROLOL TARTRATE 25 MG PO TABS: ORAL_TABLET | ORAL | 1 refills | Status: DC

## 2019-04-21 NOTE — Patient Instructions (Signed)
Increase Metoprolol dose- Take 1 1/2 tablet in the am and 2 tablets in the evening

## 2019-04-21 NOTE — Progress Notes (Signed)
  Echocardiogram 2D Echocardiogram has been performed.  Wesley Garcia 04/21/2019, 4:11 PM

## 2019-04-21 NOTE — Progress Notes (Signed)
Primary Care Physician: Gayland Curry, DO Primary Cardiologist: none Primary Electrophysiologist: none Referring Physician: Dr Larena Glassman is a 86 y.o. male with a history of CKD, DM, HTN, squamous cell carcinoma of hard palate, fall with hip fracture s/p surgical repair 02/27/19, and persistent atrial fibrillation who presents for follow up in the [facility].  The patient was initially diagnosed with atrial fibrillation on 03/14/19 after presenting to his PCP office after an abnormal ECG by home health. ECG confirmed afib with RVR. His Xarelto was increased to 15 mg daily for a CHADS2VASC score of 4. He was also started on Lopressor for rate control. On follow up with PCP 03/24/19 patient was still in afib with RVR with symptoms of fatigue. Patient is s/p DCCV on 04/07/19. Patient reports that he initially felt better for a few days after his DCCV with less SOB. Unfortunately, on follow up with his PCP on 04/10/19, he was back in afib with RVR with symptoms of SOB and palpitations. He was started on amiodarone 04/14/19.  On follow up today, patient reports that he is about the same. He is a difficult historian. His heart rate has improved on the higher dose of BB. He is tolerating the amiodarone without difficulty. His family reports that his breathing is a little better and his leg edema is back to baseline.   Today, he denies symptoms of orthopnea, PND, presyncope, syncope, snoring, daytime somnolence, bleeding, or neurologic sequela. The patient is tolerating medications without difficulties and is otherwise without complaint today.    Atrial Fibrillation Risk Factors:  he does not have symptoms or diagnosis of sleep apnea.   he has a BMI of Body mass index is 25.02 kg/m.Marland Kitchen Filed Weights   04/21/19 1456  Weight: 70.3 kg    Family History  Problem Relation Age of Onset  . Cancer Mother        ?  Marland Kitchen Bone cancer Sister   . Diabetes Brother   . Kidney  disease Sister   . Colon cancer Neg Hx      Atrial Fibrillation Management history:  Previous antiarrhythmic drugs: amiodarone Previous cardioversions: none Previous ablations: none CHADS2VASC score: 4 Anticoagulation history: Xarelto (renal dosing)   Past Medical History:  Diagnosis Date  . Chronic kidney disease (CKD), stage III (moderate)   . Diabetes mellitus   . Diverticulosis   . Hard of hearing   . Hemorrhoids   . Hx of adenomatous colonic polyps 2006  . Hypertension   . Squamous cell carcinoma of hard palate ([place]) 2003   Past Surgical History:  Procedure Laterality Date  . CARDIOVERSION N/A 04/07/2019   Procedure: CARDIOVERSION;  Surgeon: Josue Hector, MD;  Location: [facility] ENDOSCOPY;  Service: Cardiovascular;  Laterality: N/A;  . CHOLECYSTECTOMY N/A 04/17/2014   Procedure: LAPAROSCOPIC CHOLECYSTECTOMY WITH INTRAOPERATIVE CHOLANGIOGRAM;  Surgeon: Erroll Luna, MD;  Location: [facility];  Service: General;  Laterality: N/A;  . COLONOSCOPY  11/02/2007  . MAXILLECTOMY Right 2003   SCCA alveolar ridge    Current Outpatient Medications  Medication Sig Dispense Refill  . ACCU-CHEK SOFTCLIX LANCETS lancets 100 each by Other route 3 (three) times daily. Use as instructed Dx:E11.22 100 each 3  . acetaminophen (TYLENOL) 500 MG tablet Take 500 mg by mouth as needed for moderate pain.     Marland Kitchen amiodarone (PACERONE) 200 MG tablet Take 1 tablet by mouth twice a day for 1 month then decrease to 1 tablet daily  60 tablet 0  . Blood Glucose Monitoring Suppl (ACCU-CHEK AVIVA PLUS) w/Device KIT 1 Units by Does not apply route as directed. Use as directed to check blood sugar. Dx:E11.22 1 kit 0  . EASY COMFORT PEN NEEDLES 31G X 5 MM MISC use as directed TO INJECT INSULIN TO CONTROL BLOOD SUGAR  11  . furosemide (LASIX) 20 MG tablet Take 1 tablet by mouth daily for the next 3 days then only as needed for weight gain 30 tablet 1  . glucose blood (ACCU-CHEK AVIVA) test strip Use to test blood sugar  three times daily. Dx:E11.22 300 each 3  . Insulin Glargine, 2 Unit Dial, (TOUJEO MAX SOLOSTAR) 300 UNIT/ML SOPN Inject 20 Units into the skin every morning. (Patient taking differently: Inject 15 Units into the skin every morning. ) 6 pen 3  . linagliptin (TRADJENTA) 5 MG TABS tablet Take 1 tablet (5 mg total) by mouth daily. 90 tablet 1  . metoprolol tartrate (LOPRESSOR) 25 MG tablet Take 1.5 tablets (37.5 mg total) by mouth 2 (two) times daily. 90 tablet 1  . Multiple Vitamins-Minerals (OCUVITE ADULT 50+) CAPS Take 1 capsule by mouth daily.    . pantoprazole (PROTONIX) 40 MG tablet TAKE 1 TABLET (40 MG TOTAL) BY MOUTH 2 (TWO) TIMES DAILY BEFORE A MEAL. (Patient taking differently: Take 40 mg by mouth daily. ) 180 tablet 0  . Rivaroxaban (XARELTO) 15 MG TABS tablet Take 1 tablet (15 mg total) by mouth daily. 30 tablet 5  . tamsulosin (FLOMAX) 0.4 MG CAPS capsule Take 1 capsule (0.4 mg total) by mouth at bedtime. 90 capsule 1   No current facility-administered medications for this encounter.    No Known Allergies  Social History   Socioeconomic History  . Marital status: Married    Spouse name: Not on file  . Number of children: 5  . Years of education: Not on file  . Highest education level: Not on file  Occupational History  . Occupation: Retired  Tobacco Use  . Smoking status: Former Smoker    Types: Cigars    Quit date: 04/07/2000    Years since quitting: 19.0  . Smokeless tobacco: Never Used  Substance and Sexual Activity  . Alcohol use: Not Currently    Comment: socially  . Drug use: No  . Sexual activity: Never  Other Topics Concern  . Not on file  Social History Narrative   Retired, married   Social Determinants of Health   Financial Resource Strain:   . Difficulty of Paying Living Expenses: Not on file  Food Insecurity:   . Worried About Charity fundraiser in the Last Year: Not on file  . Ran Out of Food in the Last Year: Not on file  Transportation Needs:   .  Lack of Transportation (Medical): Not on file  . Lack of Transportation (Non-Medical): Not on file  Physical Activity:   . Days of Exercise per Week: Not on file  . Minutes of Exercise per Session: Not on file  Stress:   . Feeling of Stress : Not on file  Social Connections:   . Frequency of Communication with Friends and Family: Not on file  . Frequency of Social Gatherings with Friends and Family: Not on file  . Attends Religious Services: Not on file  . Active Member of Clubs or Organizations: Not on file  . Attends Archivist Meetings: Not on file  . Marital Status: Not on file  Intimate Partner Violence:   .  Fear of Current or Ex-Partner: Not on file  . Emotionally Abused: Not on file  . Physically Abused: Not on file  . Sexually Abused: Not on file     ROS- All systems are reviewed and negative except as per the HPI above.  Physical Exam: Vitals:   04/21/19 1456  BP: 132/70  Pulse: (!) 114  Weight: 70.3 kg  Height: _0  (1.676 m)    GEN- The patient is well appearing elderly male, alert and oriented x 3 today.   HEENT-head normocephalic, atraumatic, sclera clear, conjunctiva pink, hearing intact, trachea midline. Lungs- Clear to ausculation bilaterally, normal work of breathing Heart- irregular rate and rhythm, no murmurs, rubs or gallops  GI- soft, NT, ND, + BS Extremities- no clubbing, cyanosis. 1+ bilateral edema MS- no significant deformity or atrophy Skin- no rash or lesion Psych- euthymic mood, full affect Neuro- strength and sensation are intact   Wt Readings from Last 3 Encounters:  04/21/19 70.3 kg  04/14/19 70.3 kg  04/10/19 70.3 kg    EKG today demonstrates afib HR 114, PVC, QRS 70, QTc 380  Epic records are reviewed at length today  CHA2DS2-VASc Score = 4 The patient's score is based upon: CHF History: No HTN History: Yes Age : 36 + Diabetes History: Yes Stroke History: No Vascular Disease History: No Gender: Male      ASSESSMENT AND PLAN: 1. Persistent Atrial Fibrillation (ICD10:  I48.19) The patient's CHA2DS2-VASc score is 4, indicating a 4.8% annual risk of stroke.   S/p DCCV on 04/07/19. Unfortunately he had ERAF. Continue amiodarone 200 mg BID for 3 weeks then decrease to 200 mg daily. Will plan for DCCV at that time if he has not converted. Continue Xarelto 15 mg daily  Increase metoprolol to 37.5 mg AM and 50 mg PM. Titrating cautiously given h/o bradycardia in SR. Echocardiogram scheduled for today.  2. Secondary Hypercoagulable State (ICD10:  D68.69) The patient is at significant risk for stroke/thromboembolism based upon his CHA2DS2-VASc Score of 4.  Continue Rivaroxaban (Xarelto).    3. HTN Stable, med changes as above.   Follow up in the AF clinic in 2 weeks.   [facility] [address] [place], [place] [address] [phone] 04/21/2019 3:24 PM

## 2019-04-22 ENCOUNTER — Other Ambulatory Visit (HOSPITAL_COMMUNITY): Payer: Self-pay | Admitting: *Deleted

## 2019-04-22 DIAGNOSIS — R0989 Other specified symptoms and signs involving the circulatory and respiratory systems: Secondary | ICD-10-CM

## 2019-04-22 NOTE — Progress Notes (Signed)
This encounter was created in error - please disregard.

## 2019-04-24 ENCOUNTER — Ambulatory Visit: Payer: Medicare PPO | Admitting: Cardiovascular Disease

## 2019-05-01 ENCOUNTER — Ambulatory Visit: Payer: Medicare PPO | Attending: Internal Medicine

## 2019-05-01 DIAGNOSIS — Z23 Encounter for immunization: Secondary | ICD-10-CM

## 2019-05-01 NOTE — Progress Notes (Signed)
   Covid-19 Vaccination Clinic  Name:  Wesley Garcia    MRN: [id number] DOB: 07-08-1929  05/01/2019  Wesley Garcia was observed post Covid-19 immunization for 15 minutes without incidence. He was provided with Vaccine Information Sheet and instruction to access the V-Safe system.   Wesley Garcia was instructed to call 911 with any severe reactions post vaccine: Marland Kitchen Difficulty breathing  . Swelling of your face and throat  . A fast heartbeat  . A bad rash all over your body  . Dizziness and weakness    Immunizations Administered    Name Date Dose VIS Date Route   Pfizer COVID-19 Vaccine 05/01/2019 12:33 PM 0.3 mL 02/14/2019 Intramuscular   Manufacturer: August   Lot: [id number]   [place]: [id number]

## 2019-05-05 ENCOUNTER — Encounter (HOSPITAL_COMMUNITY): Payer: Self-pay | Admitting: Physician Assistant

## 2019-05-05 ENCOUNTER — Ambulatory Visit (HOSPITAL_COMMUNITY)
Admission: RE | Admit: 2019-05-05 | Discharge: 2019-05-05 | Disposition: A | Discharge status: Home / Self Care | Payer: Medicare PPO | Source: Ambulatory Visit | Attending: Physician Assistant | Admitting: Physician Assistant

## 2019-05-05 ENCOUNTER — Other Ambulatory Visit: Payer: Self-pay

## 2019-05-05 VITALS — BP 140/90 | HR 93 | Ht 66.0 in | Wt 155.0 lb

## 2019-05-05 DIAGNOSIS — D6869 Other thrombophilia: Secondary | ICD-10-CM | POA: Diagnosis not present

## 2019-05-05 DIAGNOSIS — E118 Type 2 diabetes mellitus with unspecified complications: Secondary | ICD-10-CM | POA: Diagnosis not present

## 2019-05-05 DIAGNOSIS — N183 Chronic kidney disease, stage 3 unspecified: Secondary | ICD-10-CM | POA: Diagnosis not present

## 2019-05-05 DIAGNOSIS — I4819 Other persistent atrial fibrillation: Secondary | ICD-10-CM | POA: Diagnosis present

## 2019-05-05 DIAGNOSIS — I130 Hypertensive heart and chronic kidney disease with heart failure and stage 1 through stage 4 chronic kidney disease, or unspecified chronic kidney disease: Secondary | ICD-10-CM | POA: Insufficient documentation

## 2019-05-05 DIAGNOSIS — I5022 Chronic systolic (congestive) heart failure: Secondary | ICD-10-CM | POA: Insufficient documentation

## 2019-05-05 DIAGNOSIS — Z794 Long term (current) use of insulin: Secondary | ICD-10-CM | POA: Diagnosis not present

## 2019-05-05 DIAGNOSIS — Z87891 Personal history of nicotine dependence: Secondary | ICD-10-CM | POA: Diagnosis not present

## 2019-05-05 DIAGNOSIS — Z7901 Long term (current) use of anticoagulants: Secondary | ICD-10-CM | POA: Insufficient documentation

## 2019-05-05 DIAGNOSIS — Z79899 Other long term (current) drug therapy: Secondary | ICD-10-CM | POA: Diagnosis not present

## 2019-05-05 LAB — CBC
HCT: 44.6 % (ref 39.0–52.0)
Hemoglobin: 13.8 g/dL (ref 13.0–17.0)
MCH: 29.6 pg (ref 26.0–34.0)
MCHC: 30.9 g/dL (ref 30.0–36.0)
MCV: 95.5 fL (ref 80.0–100.0)
Platelets: 308 10*3/uL (ref 150–400)
RBC: 4.67 MIL/uL (ref 4.22–5.81)
RDW: 14.6 % (ref 11.5–15.5)
WBC: 7.6 10*3/uL (ref 4.0–10.5)
nRBC: 0 % (ref 0.0–0.2)

## 2019-05-05 LAB — BASIC METABOLIC PANEL
Anion gap: 9 (ref 5–15)
BUN: 24 mg/dL — ABNORMAL HIGH (ref 8–23)
CO2: 25 mmol/L (ref 22–32)
Calcium: 9.2 mg/dL (ref 8.9–10.3)
Chloride: 106 mmol/L (ref 98–111)
Creatinine, Ser: 2.17 mg/dL — ABNORMAL HIGH (ref 0.61–1.24)
GFR calc Af Amer: 31 mL/min — ABNORMAL LOW (ref >60)
GFR calc non Af Amer: 26 mL/min — ABNORMAL LOW (ref >60)
Glucose, Bld: 161 mg/dL — ABNORMAL HIGH (ref 70–99)
Potassium: 4.3 mmol/L (ref 3.5–5.1)
Sodium: 140 mmol/L (ref 135–145)

## 2019-05-05 NOTE — Patient Instructions (Signed)
Cardioversion scheduled for Tuesday, March 9th  - Arrive at the [facility] and go to admitting at [facility] not eat or drink anything after midnight the night prior to your procedure.  - Take all your morning medication with a sip of water prior to arrival.  - You will not be able to drive home after your procedure.   Night before cardioversion reduce metoprolol back to 12.5mg  twice a day  On Monday, March 8th reduce amiodarone to 200mg  once a day

## 2019-05-05 NOTE — Progress Notes (Signed)
Primary Care Physician: Gayland Curry, DO Primary Cardiologist: Dr Irish Lack (new) Primary Electrophysiologist: none Referring Physician: Dr Larena Glassman is a 86 y.o. male with a history of CKD, DM, HTN, squamous cell carcinoma of hard palate, fall with hip fracture s/p surgical repair 02/27/19, and persistent atrial fibrillation who presents for follow up in the [facility].  The patient was initially diagnosed with atrial fibrillation on 03/14/19 after presenting to his PCP office after an abnormal ECG by home health. ECG confirmed afib with RVR. His Xarelto was increased to 15 mg daily for a CHADS2VASC score of 4. He was also started on Lopressor for rate control. On follow up with PCP 03/24/19 patient was still in afib with RVR with symptoms of fatigue. Patient is s/p DCCV on 04/07/19. Patient reports that he initially felt better for a few days after his DCCV with less SOB. Unfortunately, on follow up with his PCP on 04/10/19, he was back in afib with RVR with symptoms of SOB and palpitations. He was started on amiodarone 04/14/19.  On follow up today, patient reports that he is doing about the same since his last visit. He family who accompanies him today does report that they feel he has improved with less SOB and more energy. He is tolerating the medication without difficulty.   Today, he denies symptoms of palpitations, orthopnea, PND, presyncope, syncope, snoring, daytime somnolence, bleeding, or neurologic sequela. The patient is tolerating medications without difficulties and is otherwise without complaint today.    Atrial Fibrillation Risk Factors:  he does not have symptoms or diagnosis of sleep apnea.   he has a BMI of Body mass index is 25.02 kg/m.Marland Kitchen Filed Weights   05/05/19 1427  Weight: 70.3 kg    Family History  Problem Relation Age of Onset  . Cancer Mother        ?  Marland Kitchen Bone cancer Sister   . Diabetes Brother   . Kidney disease Sister    . Colon cancer Neg Hx      Atrial Fibrillation Management history:  Previous antiarrhythmic drugs: amiodarone Previous cardioversions: none Previous ablations: none CHADS2VASC score: 4 Anticoagulation history: Xarelto (renal dosing)   Past Medical History:  Diagnosis Date  . Chronic kidney disease (CKD), stage III (moderate)   . Diabetes mellitus   . Diverticulosis   . Hard of hearing   . Hemorrhoids   . Hx of adenomatous colonic polyps 2006  . Hypertension   . Squamous cell carcinoma of hard palate ([place]) 2003   Past Surgical History:  Procedure Laterality Date  . CARDIOVERSION N/A 04/07/2019   Procedure: CARDIOVERSION;  Surgeon: Josue Hector, MD;  Location: [facility] ENDOSCOPY;  Service: Cardiovascular;  Laterality: N/A;  . CHOLECYSTECTOMY N/A 04/17/2014   Procedure: LAPAROSCOPIC CHOLECYSTECTOMY WITH INTRAOPERATIVE CHOLANGIOGRAM;  Surgeon: Erroll Luna, MD;  Location: [facility];  Service: General;  Laterality: N/A;  . COLONOSCOPY  11/02/2007  . MAXILLECTOMY Right 2003   SCCA alveolar ridge    Current Outpatient Medications  Medication Sig Dispense Refill  . ACCU-CHEK SOFTCLIX LANCETS lancets 100 each by Other route 3 (three) times daily. Use as instructed Dx:E11.22 100 each 3  . acetaminophen (TYLENOL) 500 MG tablet Take 500 mg by mouth as needed for moderate pain.     Marland Kitchen amiodarone (PACERONE) 200 MG tablet Take 1 tablet by mouth twice a day for 1 month then decrease to 1 tablet daily 60 tablet 0  . Blood  Glucose Monitoring Suppl (ACCU-CHEK AVIVA PLUS) w/Device KIT 1 Units by Does not apply route as directed. Use as directed to check blood sugar. Dx:E11.22 1 kit 0  . EASY COMFORT PEN NEEDLES 31G X 5 MM MISC use as directed TO INJECT INSULIN TO CONTROL BLOOD SUGAR  11  . furosemide (LASIX) 20 MG tablet Take 1 tablet by mouth daily for the next 3 days then only as needed for weight gain 30 tablet 1  . glucose blood (ACCU-CHEK AVIVA) test strip Use to test blood sugar three times  daily. Dx:E11.22 300 each 3  . Insulin Glargine, 2 Unit Dial, (TOUJEO MAX SOLOSTAR) 300 UNIT/ML SOPN Inject 20 Units into the skin every morning. (Patient taking differently: Inject 15 Units into the skin every morning. ) 6 pen 3  . linagliptin (TRADJENTA) 5 MG TABS tablet Take 1 tablet (5 mg total) by mouth daily. 90 tablet 1  . metoprolol tartrate (LOPRESSOR) 25 MG tablet Take 1 1/2 tablets by mouth in the am and 2 tablets in the evening 105 tablet 1  . Multiple Vitamins-Minerals (OCUVITE ADULT 50+) CAPS Take 1 capsule by mouth daily.    . pantoprazole (PROTONIX) 40 MG tablet TAKE 1 TABLET (40 MG TOTAL) BY MOUTH 2 (TWO) TIMES DAILY BEFORE A MEAL. (Patient taking differently: Take 40 mg by mouth daily. ) 180 tablet 0  . Rivaroxaban (XARELTO) 15 MG TABS tablet Take 1 tablet (15 mg total) by mouth daily. 30 tablet 5  . tamsulosin (FLOMAX) 0.4 MG CAPS capsule Take 1 capsule (0.4 mg total) by mouth at bedtime. 90 capsule 1   No current facility-administered medications for this encounter.    No Known Allergies  Social History   Socioeconomic History  . Marital status: Married    Spouse name: Not on file  . Number of children: 5  . Years of education: Not on file  . Highest education level: Not on file  Occupational History  . Occupation: Retired  Tobacco Use  . Smoking status: Former Smoker    Types: Cigars    Quit date: 04/07/2000    Years since quitting: 19.0  . Smokeless tobacco: Never Used  Substance and Sexual Activity  . Alcohol use: Not Currently    Comment: socially  . Drug use: No  . Sexual activity: Never  Other Topics Concern  . Not on file  Social History Narrative   Retired, married   Social Determinants of Health   Financial Resource Strain:   . Difficulty of Paying Living Expenses: Not on file  Food Insecurity:   . Worried About Charity fundraiser in the Last Year: Not on file  . Ran Out of Food in the Last Year: Not on file  Transportation Needs:   . Lack  of Transportation (Medical): Not on file  . Lack of Transportation (Non-Medical): Not on file  Physical Activity:   . Days of Exercise per Week: Not on file  . Minutes of Exercise per Session: Not on file  Stress:   . Feeling of Stress : Not on file  Social Connections:   . Frequency of Communication with Friends and Family: Not on file  . Frequency of Social Gatherings with Friends and Family: Not on file  . Attends Religious Services: Not on file  . Active Member of Clubs or Organizations: Not on file  . Attends Archivist Meetings: Not on file  . Marital Status: Not on file  Intimate Partner Violence:   . Fear  of Current or Ex-Partner: Not on file  . Emotionally Abused: Not on file  . Physically Abused: Not on file  . Sexually Abused: Not on file     ROS- All systems are reviewed and negative except as per the HPI above.  Physical Exam: Vitals:   05/05/19 1427  BP: 140/90  Pulse: 93  Weight: 70.3 kg  Height: 5' 6"  (1.676 m)    GEN- The patient is well appearing elderly male, alert and oriented x 3 today.   HEENT-head normocephalic, atraumatic, sclera clear, conjunctiva pink, hearing intact, trachea midline. Lungs- Clear to ausculation bilaterally, normal work of breathing Heart- irregular rate and rhythm, no murmurs, rubs or gallops  GI- soft, NT, ND, + BS Extremities- no clubbing, cyanosis. 1+ edema MS- no significant deformity or atrophy Skin- no rash or lesion Psych- euthymic mood, full affect Neuro- strength and sensation are intact   Wt Readings from Last 3 Encounters:  05/05/19 70.3 kg  04/21/19 70.3 kg  04/14/19 70.3 kg    EKG today demonstrates afib HR 93, QRS 90, QTc 412  Echo 04/21/19 demonstrated 1. Septal apical distal anterior wall mid and apical inferior wall  hypokinesis Basal function preserved Abnormal GLS -6. Left ventricular  ejection fraction, by estimation, is 25%. The left ventricle has severely  decreased function. The left  ventricle  demonstrates regional wall motion abnormalities (see scoring  diagram/findings for description). The left ventricular internal cavity  size was moderately to severely dilated. Left ventricular diastolic  parameters are indeterminate.  2. Right ventricular systolic function is normal. The right ventricular  size is normal. There is moderately elevated pulmonary artery systolic  pressure.  3. Left atrial size was mildly dilated.  4. The mitral valve is normal in structure and function. Mild mitral  valve regurgitation. No evidence of mitral stenosis.  5. The aortic valve is tricuspid. Aortic valve regurgitation is trivial.  Mild aortic valve sclerosis is present, with no evidence of aortic valve  stenosis.  6. The inferior vena cava is dilated in size with >50% respiratory  variability, suggesting right atrial pressure of 8 mmHg.   Epic records are reviewed at length today  CHA2DS2-VASc Score = 4 The patient's score is based upon: CHF History: No HTN History: Yes Age : 4 + Diabetes History: Yes Stroke History: No Vascular Disease History: No Gender: Male     ASSESSMENT AND PLAN: 1. Persistent Atrial Fibrillation (ICD10:  I48.19) The patient's CHA2DS2-VASc score is 4, indicating a 4.8% annual risk of stroke.   S/p DCCV on 04/07/19. Unfortunately he had ERAF. Patient remains in afib but heart rate is better controlled. Continue amiodarone 200 mg BID for one more week, then decrease to 200 mg daily.  Will plan for DCCV. Check Bmet/CBC today. Continue Xarelto 15 mg daily. Patient denies missed doses in the last 3 weeks. Continue metoprolol to 37.5 mg AM and 50 mg PM. Decrease back to 12.5 mg BID the night before DCCV given h/o bradycardia in SR.  2. Secondary Hypercoagulable State (ICD10:  D68.69) The patient is at significant risk for stroke/thromboembolism based upon his CHA2DS2-VASc Score of 4.  Continue Rivaroxaban (Xarelto).   3. HTN Stable, no changes today.   4. Systolic CHF Echo showed EF 25% ? Tachycardia mediated. Anterior WMA. Leg edema stable, breathing better.   Follow up with Dr Irish Lack as scheduled, AF clinic one week post DCCV.   [facility] [address] [place], [place] [address]  [phone] 05/05/2019 2:58 PM

## 2019-05-06 ENCOUNTER — Other Ambulatory Visit (HOSPITAL_COMMUNITY): Payer: Self-pay | Admitting: Physician Assistant

## 2019-05-06 DIAGNOSIS — I495 Sick sinus syndrome: Secondary | ICD-10-CM

## 2019-05-09 ENCOUNTER — Other Ambulatory Visit (HOSPITAL_COMMUNITY)
Admission: RE | Admit: 2019-05-09 | Discharge: 2019-05-09 | Disposition: A | Discharge status: Home / Self Care | Payer: Medicare PPO | Source: Ambulatory Visit | Attending: Cardiovascular Disease | Admitting: Cardiovascular Disease

## 2019-05-09 ENCOUNTER — Other Ambulatory Visit: Payer: Self-pay

## 2019-05-09 DIAGNOSIS — Z01812 Encounter for preprocedural laboratory examination: Secondary | ICD-10-CM | POA: Diagnosis present

## 2019-05-09 DIAGNOSIS — Z20822 Contact with and (suspected) exposure to covid-19: Secondary | ICD-10-CM | POA: Diagnosis not present

## 2019-05-10 LAB — SARS CORONAVIRUS 2 (TAT 6-24 HRS): SARS Coronavirus 2: NEGATIVE

## 2019-05-12 ENCOUNTER — Encounter: Payer: Self-pay | Admitting: Interventional Cardiology

## 2019-05-12 ENCOUNTER — Other Ambulatory Visit: Payer: Self-pay

## 2019-05-12 ENCOUNTER — Ambulatory Visit: Payer: Medicare PPO | Admitting: Interventional Cardiology

## 2019-05-12 VITALS — BP 126/70 | HR 80 | Ht 66.0 in | Wt 155.0 lb

## 2019-05-12 DIAGNOSIS — I4819 Other persistent atrial fibrillation: Secondary | ICD-10-CM | POA: Diagnosis not present

## 2019-05-12 DIAGNOSIS — I5022 Chronic systolic (congestive) heart failure: Secondary | ICD-10-CM | POA: Diagnosis not present

## 2019-05-12 DIAGNOSIS — Z7901 Long term (current) use of anticoagulants: Secondary | ICD-10-CM

## 2019-05-12 NOTE — H&P (View-Only) (Signed)
  Cardiology Office Note   Date:  05/12/2019   ID:  Wesley Garcia, DOB 12/03/1929, MRN [id number]  PCP:  Reed, Tiffany L, DO    No chief complaint on file.  AFib  Wt Readings from Last 3 Encounters:  05/12/19 155 lb (70.3 kg)  05/05/19 155 lb (70.3 kg)  04/21/19 155 lb (70.3 kg)       History of Present Illness: Wesley Garcia is a 86 y.o. male   with a history of CKD, DM, HTN, squamous cell carcinoma of hard palate, fall with hip fracture s/p surgical repair 02/27/19, and persistent atrial fibrillation who presented for consultation in the [facility] in Jan 2021.   Prior records show: " The patient was initially diagnosed with atrial fibrillation on 03/14/19 after presenting to his PCP office after an abnormal ECG by home health. ECG confirmed afib with RVR. His Xarelto was increased to 15 mg daily for a CHADS2VASC score of 4. He was also started on Lopressor for rate control. On follow up with PCP 03/24/19 patient was still in afib with RVR with symptoms of fatigue. His Lopressor was increased. Of note, he was also recently on antibiotics for acute prostatitis. He does have a history of bradycardia. Patient denied any heart racing, palpitations, SOB, or CP. "  "On follow up with PCP 03/24/19 patient was still in afib with RVR with symptoms of fatigue. Patient is s/p DCCV on 04/07/19. Patient reports that he initially felt better for a few days after his DCCV with less SOB. Unfortunately, on follow up with his PCP on 04/10/19, he was back in afib with RVR with symptoms of SOB and palpitations. He was started on amiodarone 04/14/19."  EF found to be 25% in 2/21.  Plan was for repeat DCCV after he was on Amio.  He has had palpitations.  He feels very fatigued.  Prior to his A. fib diagnosis a few months ago, he did not have any chest discomfort, palpitations.  His activity was limited.  He only walk short distances with a walker.  Since the atrial fibrillation has come  on, his small amount of exertion has been more limited.   Past Medical History:  Diagnosis Date  . Chronic kidney disease (CKD), stage III (moderate)   . Diabetes mellitus   . Diverticulosis   . Hard of hearing   . Hemorrhoids   . Hx of adenomatous colonic polyps 2006  . Hypertension   . Squamous cell carcinoma of hard palate (HCC) 2003    Past Surgical History:  Procedure Laterality Date  . CARDIOVERSION N/A 04/07/2019   Procedure: CARDIOVERSION;  Surgeon: Nishan, Peter C, MD;  Location: [facility] ENDOSCOPY;  Service: Cardiovascular;  Laterality: N/A;  . CHOLECYSTECTOMY N/A 04/17/2014   Procedure: LAPAROSCOPIC CHOLECYSTECTOMY WITH INTRAOPERATIVE CHOLANGIOGRAM;  Surgeon: Thomas Cornett, MD;  Location: [facility] OR;  Service: General;  Laterality: N/A;  . COLONOSCOPY  11/02/2007  . MAXILLECTOMY Right 2003   SCCA alveolar ridge     Current Outpatient Medications  Medication Sig Dispense Refill  . ACCU-CHEK SOFTCLIX LANCETS lancets 100 each by Other route 3 (three) times daily. Use as instructed Dx:E11.22 100 each 3  . acetaminophen (TYLENOL) 500 MG tablet Take 1,000 mg by mouth every 6 (six) hours as needed for moderate pain.     . amiodarone (PACERONE) 200 MG tablet Take 200 mg by mouth daily.    . Blood Glucose Monitoring Suppl (ACCU-CHEK AVIVA PLUS) w/Device KIT 1 Units   by Does not apply route as directed. Use as directed to check blood sugar. Dx:E11.22 1 kit 0  . EASY COMFORT PEN NEEDLES 31G X 5 MM MISC use as directed TO INJECT INSULIN TO CONTROL BLOOD SUGAR  11  . furosemide (LASIX) 20 MG tablet Take 1 tablet by mouth daily as needed for weight gain/swelling 30 tablet 1  . glucose blood (ACCU-CHEK AVIVA) test strip Use to test blood sugar three times daily. Dx:E11.22 300 each 3  . insulin glargine, 2 Unit Dial, (TOUJEO MAX SOLOSTAR) 300 UNIT/ML Solostar Pen Inject 15 Units into the skin every morning.    . linagliptin (TRADJENTA) 5 MG TABS tablet Take 1 tablet (5 mg total) by mouth daily. 90  tablet 1  . metoprolol tartrate (LOPRESSOR) 25 MG tablet TAKE 1 AND 1/2 TABLET BY MOUTH TWICE A DAY 90 tablet 1  . Multiple Vitamins-Minerals (OCUVITE ADULT 50+) CAPS Take 1 capsule by mouth daily.    . pantoprazole (PROTONIX) 40 MG tablet TAKE 1 TABLET (40 MG TOTAL) BY MOUTH 2 (TWO) TIMES DAILY BEFORE A MEAL. 180 tablet 0  . Rivaroxaban (XARELTO) 15 MG TABS tablet Take 1 tablet (15 mg total) by mouth daily. 30 tablet 5  . tamsulosin (FLOMAX) 0.4 MG CAPS capsule Take 1 capsule (0.4 mg total) by mouth at bedtime. 90 capsule 1   No current facility-administered medications for this visit.    Allergies:   Patient has no known allergies.    Social History:  The patient  reports that he quit smoking about 19 years ago. His smoking use included cigars. He has never used smokeless tobacco. He reports previous alcohol use. He reports that he does not use drugs.   Family History:  The patient's family history includes Bone cancer in his sister; Cancer in his mother; Diabetes in his brother; Kidney disease in his sister.    ROS:  Please see the history of present illness.   Otherwise, review of systems are positive for fatigue.   All other systems are reviewed and negative.    PHYSICAL EXAM: VS:  BP 126/70   Pulse 80   Ht 5' 6" (1.676 m)   Wt 155 lb (70.3 kg)   SpO2 99%   BMI 25.02 kg/m  , BMI Body mass index is 25.02 kg/m. GEN: Well nourished, well developed, in no acute distress  HEENT: normal  Neck: no JVD, carotid bruits, or masses Cardiac: irregularly irregular; no murmurs, rubs, or gallops,no edema  Respiratory:  clear to auscultation bilaterally, normal work of breathing GI: soft, nontender, nondistended, + BS MS: no deformity or atrophy  Skin: warm and dry, no rash Neuro:  Strength and sensation are intact Psych: euthymic mood, full affect   EKG:   The ekg ordered 05/05/19 demonstrates AFib, rate controlled   Recent Labs: 10/01/2018: ALT 12 03/25/2019: Magnesium  2.1 04/10/2019: TSH 4.06 05/05/2019: BUN 24; Creatinine, Ser 2.17; Hemoglobin 13.8; Platelets 308; Potassium 4.3; Sodium 140   Lipid Panel    Component Value Date/Time   CHOL 86 10/01/2018 0848   CHOL 95 (L) 05/07/2014 0813   TRIG 49 10/01/2018 0848   HDL 47 10/01/2018 0848   HDL 65 05/07/2014 0813   CHOLHDL 1.8 10/01/2018 0848   LDLCALC 25 10/01/2018 0848     Other studies Reviewed: Additional studies/ records that were reviewed today with results demonstrating: Cr 1.9.   ASSESSMENT AND PLAN:  1. AFib: Amio to maintain NSR.  Cardioversion tomorrow.  Given the time course of   his symptoms, I suspect his fatigue, palpitations and likely low ejection fraction is more related to his tachycardia.  I doubt that his symptoms are from new coronary artery disease.  Ideally, he will maintain sinus rhythm after cardioversion tomorrow since he is on amiodarone, and we will recheck echocardiogram in about 6 weeks.  Hopefully, EF will be improved. 2. Chronic systolic heart failure: He appears euvolemic today.  Renal dysfunction prohibits adding ACE inhibitor.  I suspect his LV dysfunction is more related to tachycardia.  We will therefore try to maintain heart rate and see if EF improves.  We will have to keep her renal dysfunction in consideration if any type of ischemic work-up was considered.  I think that a lot of his fatigue is from deconditioning.  Even prior to his atrial fibrillation, he was not very active per the family. 3. Anticoagulated: Xarelto 15 mg daily.  Needs this in anticipation of DCCV.  4. HTN: The current medical regimen is effective;  continue present plan and medications.    Current medicines are reviewed at length with the patient today.  The patient concerns regarding his medicines were addressed.  The following changes have been made:  No change  Labs/ tests ordered today include:  No orders of the defined types were placed in this encounter.   Recommend 150 minutes/week  of aerobic exercise Low fat, low carb, high fiber diet recommended  Disposition:   FU in 1 year   Signed, Larae Grooms, MD  05/12/2019 2:37 PM    [facility] [place], [place], [place]  [address] Phone: [phone]; Fax: [phone]

## 2019-05-12 NOTE — Progress Notes (Signed)
Cardiology Office Note   Date:  05/12/2019   ID:  Wesley Garcia, DOB 09/26/1929, MRN [id number]  PCP:  Gayland Curry, DO    No chief complaint on file.  AFib  Wt Readings from Last 3 Encounters:  05/12/19 155 lb (70.3 kg)  05/05/19 155 lb (70.3 kg)  04/21/19 155 lb (70.3 kg)       History of Present Illness: Wesley Garcia is a 86 y.o. male   with a history of CKD, DM, HTN, squamous cell carcinoma of hard palate, fall with hip fracture s/p surgical repair 02/27/19, and persistent atrial fibrillation who presented for consultation in the [facility] in Jan 2021.   Prior records show: " The patient was initially diagnosed with atrial fibrillation on 03/14/19 after presenting to his PCP office after an abnormal ECG by home health. ECG confirmed afib with RVR. His Xarelto was increased to 15 mg daily for a CHADS2VASC score of 4. He was also started on Lopressor for rate control. On follow up with PCP 03/24/19 patient was still in afib with RVR with symptoms of fatigue. His Lopressor was increased. Of note, he was also recently on antibiotics for acute prostatitis. He does have a history of bradycardia. Patient denied any heart racing, palpitations, SOB, or CP. "  "On follow up with PCP 03/24/19 patient was still in afib with RVR with symptoms of fatigue. Patient is s/p DCCV on 04/07/19. Patient reports that he initially felt better for a few days after his DCCV with less SOB. Unfortunately, on follow up with his PCP on 04/10/19, he was back in afib with RVR with symptoms of SOB and palpitations. He was started on amiodarone 04/14/19."  EF found to be 25% in 2/21.  Plan was for repeat DCCV after he was on Amio.  He has had palpitations.  He feels very fatigued.  Prior to his A. fib diagnosis a few months ago, he did not have any chest discomfort, palpitations.  His activity was limited.  He only walk short distances with a walker.  Since the atrial fibrillation has come  on, his small amount of exertion has been more limited.   Past Medical History:  Diagnosis Date  . Chronic kidney disease (CKD), stage III (moderate)   . Diabetes mellitus   . Diverticulosis   . Hard of hearing   . Hemorrhoids   . Hx of adenomatous colonic polyps 2006  . Hypertension   . Squamous cell carcinoma of hard palate ([facility]) 2003    Past Surgical History:  Procedure Laterality Date  . CARDIOVERSION N/A 04/07/2019   Procedure: CARDIOVERSION;  Surgeon: Josue Hector, MD;  [facility] ENDOSCOPY;  Service: Cardiovascular;  Laterality: N/A;  . CHOLECYSTECTOMY N/A 04/17/2014   Procedure: LAPAROSCOPIC CHOLECYSTECTOMY WITH INTRAOPERATIVE CHOLANGIOGRAM;  Surgeon: Erroll Luna, MD;  Location: [facility];  Service: General;  Laterality: N/A;  . COLONOSCOPY  11/02/2007  . MAXILLECTOMY Right 2003   SCCA alveolar ridge     Current Outpatient Medications  Medication Sig Dispense Refill  . ACCU-CHEK SOFTCLIX LANCETS lancets 100 each by Other route 3 (three) times daily. Use as instructed Dx:E11.22 100 each 3  . acetaminophen (TYLENOL) 500 MG tablet Take 1,000 mg by mouth every 6 (six) hours as needed for moderate pain.     Marland Kitchen amiodarone (PACERONE) 200 MG tablet Take 200 mg by mouth daily.    . Blood Glucose Monitoring Suppl (ACCU-CHEK AVIVA PLUS) w/Device KIT 1 Units  by Does not apply route as directed. Use as directed to check blood sugar. Dx:E11.22 1 kit 0  . EASY COMFORT PEN NEEDLES 31G X 5 MM MISC use as directed TO INJECT INSULIN TO CONTROL BLOOD SUGAR  11  . furosemide (LASIX) 20 MG tablet Take 1 tablet by mouth daily as needed for weight gain/swelling 30 tablet 1  . glucose blood (ACCU-CHEK AVIVA) test strip Use to test blood sugar three times daily. Dx:E11.22 300 each 3  . insulin glargine, 2 Unit Dial, (TOUJEO MAX SOLOSTAR) 300 UNIT/ML Solostar Pen Inject 15 Units into the skin every morning.    . linagliptin (TRADJENTA) 5 MG TABS tablet Take 1 tablet (5 mg total) by mouth daily. 90  tablet 1  . metoprolol tartrate (LOPRESSOR) 25 MG tablet TAKE 1 AND 1/2 TABLET BY MOUTH TWICE A DAY 90 tablet 1  . Multiple Vitamins-Minerals (OCUVITE ADULT 50+) CAPS Take 1 capsule by mouth daily.    . pantoprazole (PROTONIX) 40 MG tablet TAKE 1 TABLET (40 MG TOTAL) BY MOUTH 2 (TWO) TIMES DAILY BEFORE A MEAL. 180 tablet 0  . Rivaroxaban (XARELTO) 15 MG TABS tablet Take 1 tablet (15 mg total) by mouth daily. 30 tablet 5  . tamsulosin (FLOMAX) 0.4 MG CAPS capsule Take 1 capsule (0.4 mg total) by mouth at bedtime. 90 capsule 1   No current facility-administered medications for this visit.    Allergies:   Patient has no known allergies.    Social History:  The patient  reports that he quit smoking about 19 years ago. His smoking use included cigars. He has never used smokeless tobacco. He reports previous alcohol use. He reports that he does not use drugs.   Family History:  The patient's family history includes Bone cancer in his sister; Cancer in his mother; Diabetes in his brother; Kidney disease in his sister.    ROS:  Please see the history of present illness.   Otherwise, review of systems are positive for fatigue.   All other systems are reviewed and negative.    PHYSICAL EXAM: VS:  BP 126/70   Pulse 80   Ht 5' 6" (1.676 m)   Wt 155 lb (70.3 kg)   SpO2 99%   BMI 25.02 kg/m  , BMI Body mass index is 25.02 kg/m. GEN: Well nourished, well developed, in no acute distress  HEENT: normal  Neck: no JVD, carotid bruits, or masses Cardiac: irregularly irregular; no murmurs, rubs, or gallops,no edema  Respiratory:  clear to auscultation bilaterally, normal work of breathing GI: soft, nontender, nondistended, + BS MS: no deformity or atrophy  Skin: warm and dry, no rash Neuro:  Strength and sensation are intact Psych: euthymic mood, full affect   EKG:   The ekg ordered 05/05/19 demonstrates AFib, rate controlled   Recent Labs: 10/01/2018: ALT 12 03/25/2019: Magnesium  2.1 04/10/2019: TSH 4.06 05/05/2019: BUN 24; Creatinine, Ser 2.17; Hemoglobin 13.8; Platelets 308; Potassium 4.3; Sodium 140   Lipid Panel    Component Value Date/Time   CHOL 86 10/01/2018 0848   CHOL 95 (L) 05/07/2014 0813   TRIG 49 10/01/2018 0848   HDL 47 10/01/2018 0848   HDL 65 05/07/2014 0813   CHOLHDL 1.8 10/01/2018 0848   LDLCALC 25 10/01/2018 0848     Other studies Reviewed: Additional studies/ records that were reviewed today with results demonstrating: Cr 1.9.   ASSESSMENT AND PLAN:  1. AFib: Amio to maintain NSR.  Cardioversion tomorrow.  Given the time course of   his symptoms, I suspect his fatigue, palpitations and likely low ejection fraction is more related to his tachycardia.  I doubt that his symptoms are from new coronary artery disease.  Ideally, he will maintain sinus rhythm after cardioversion tomorrow since he is on amiodarone, and we will recheck echocardiogram in about 6 weeks.  Hopefully, EF will be improved. 2. Chronic systolic heart failure: He appears euvolemic today.  Renal dysfunction prohibits adding ACE inhibitor.  I suspect his LV dysfunction is more related to tachycardia.  We will therefore try to maintain heart rate and see if EF improves.  We will have to keep her renal dysfunction in consideration if any type of ischemic work-up was considered.  I think that a lot of his fatigue is from deconditioning.  Even prior to his atrial fibrillation, he was not very active per the family. 3. Anticoagulated: Xarelto 15 mg daily.  Needs this in anticipation of DCCV.  4. HTN: The current medical regimen is effective;  continue present plan and medications.    Current medicines are reviewed at length with the patient today.  The patient concerns regarding his medicines were addressed.  The following changes have been made:  No change  Labs/ tests ordered today include:  No orders of the defined types were placed in this encounter.   Recommend 150 minutes/week  of aerobic exercise Low fat, low carb, high fiber diet recommended  Disposition:   FU in 1 year   Signed, Larae Grooms, MD  05/12/2019 2:37 PM    [facility] [place], [place], [place]  [address] Phone: [phone]; Fax: [phone]

## 2019-05-12 NOTE — Patient Instructions (Signed)
Medication Instructions:  Your physician recommends that you continue on your current medications as directed. Please refer to the Current Medication list given to you today.  If you need a refill on your cardiac medications before your next appointment, please call your pharmacy.   Lab work: None Ordered  If you have labs (blood work) drawn today and your tests are completely normal, you will receive your results only by: Marland Kitchen MyChart Message (if you have MyChart) OR . A paper copy in the mail If you have any lab test that is abnormal or we need to change your treatment, we will call you to review the results.  Testing/Procedures: Your physician has requested that you have an echocardiogram. Echocardiography is a painless test that uses sound waves to create images of your heart. It provides your doctor with information about the size and shape of your heart and how well your heart's chambers and valves are working. This procedure takes approximately one hour. There are no restrictions for this procedure.  Follow-Up: . Based on cardioversion and echocardiogram results  Any Other Special Instructions Will Be Listed Below (If Applicable).

## 2019-05-13 ENCOUNTER — Ambulatory Visit (HOSPITAL_COMMUNITY): Payer: Medicare PPO | Admitting: Certified Registered Nurse Anesthetist

## 2019-05-13 ENCOUNTER — Encounter (HOSPITAL_COMMUNITY): Payer: Self-pay | Admitting: Cardiovascular Disease

## 2019-05-13 ENCOUNTER — Encounter (HOSPITAL_COMMUNITY)
Admission: RE | Disposition: A | Discharge status: Home / Self Care | Payer: Self-pay | Source: Home / Self Care | Attending: Cardiovascular Disease

## 2019-05-13 ENCOUNTER — Ambulatory Visit (HOSPITAL_COMMUNITY)
Admission: RE | Admit: 2019-05-13 | Discharge: 2019-05-13 | Disposition: A | Discharge status: Home / Self Care | Payer: Medicare PPO | Attending: Cardiovascular Disease | Admitting: Cardiovascular Disease

## 2019-05-13 DIAGNOSIS — Z85818 Personal history of malignant neoplasm of other sites of lip, oral cavity, and pharynx: Secondary | ICD-10-CM | POA: Insufficient documentation

## 2019-05-13 DIAGNOSIS — R001 Bradycardia, unspecified: Secondary | ICD-10-CM | POA: Insufficient documentation

## 2019-05-13 DIAGNOSIS — I5022 Chronic systolic (congestive) heart failure: Secondary | ICD-10-CM | POA: Insufficient documentation

## 2019-05-13 DIAGNOSIS — Z87891 Personal history of nicotine dependence: Secondary | ICD-10-CM | POA: Insufficient documentation

## 2019-05-13 DIAGNOSIS — E1122 Type 2 diabetes mellitus with diabetic chronic kidney disease: Secondary | ICD-10-CM | POA: Diagnosis not present

## 2019-05-13 DIAGNOSIS — Z79899 Other long term (current) drug therapy: Secondary | ICD-10-CM | POA: Diagnosis not present

## 2019-05-13 DIAGNOSIS — I2510 Atherosclerotic heart disease of native coronary artery without angina pectoris: Secondary | ICD-10-CM | POA: Diagnosis not present

## 2019-05-13 DIAGNOSIS — N183 Chronic kidney disease, stage 3 unspecified: Secondary | ICD-10-CM | POA: Insufficient documentation

## 2019-05-13 DIAGNOSIS — I4819 Other persistent atrial fibrillation: Secondary | ICD-10-CM | POA: Diagnosis not present

## 2019-05-13 DIAGNOSIS — Z7901 Long term (current) use of anticoagulants: Secondary | ICD-10-CM | POA: Insufficient documentation

## 2019-05-13 DIAGNOSIS — I130 Hypertensive heart and chronic kidney disease with heart failure and stage 1 through stage 4 chronic kidney disease, or unspecified chronic kidney disease: Secondary | ICD-10-CM | POA: Insufficient documentation

## 2019-05-13 DIAGNOSIS — Z794 Long term (current) use of insulin: Secondary | ICD-10-CM | POA: Diagnosis not present

## 2019-05-13 HISTORY — PX: CARDIOVERSION: SHX1299

## 2019-05-13 SURGERY — CARDIOVERSION
Anesthesia: General

## 2019-05-13 MED ORDER — SODIUM CHLORIDE 0.9 % IV SOLN: INTRAVENOUS | Status: DC | PRN

## 2019-05-13 MED ORDER — PROPOFOL 10 MG/ML IV BOLUS
INTRAVENOUS | Status: DC | PRN
  Administered 2019-05-13: 50 mg via INTRAVENOUS

## 2019-05-13 MED ORDER — LIDOCAINE 2% (20 MG/ML) 5 ML SYRINGE
INTRAMUSCULAR | Status: DC | PRN
  Administered 2019-05-13: 60 mg via INTRAVENOUS

## 2019-05-13 NOTE — Discharge Instructions (Signed)
Electrical Cardioversion Electrical cardioversion is the delivery of a jolt of electricity to restore a normal rhythm to the heart. A rhythm that is too fast or is not regular keeps the heart from pumping well. In this procedure, sticky patches or metal paddles are placed on the chest to deliver electricity to the heart from a device. This procedure may be done in an emergency if:  There is low or no blood pressure as a result of the heart rhythm.  Normal rhythm must be restored as fast as possible to protect the brain and heart from further damage.  It may save a life. This may also be a scheduled procedure for irregular or fast heart rhythms that are not immediately life-threatening. Tell a health care provider about:  Any allergies you have.  All medicines you are taking, including vitamins, herbs, eye drops, creams, and over-the-counter medicines.  Any problems you or family members have had with anesthetic medicines.  Any blood disorders you have.  Any surgeries you have had.  Any medical conditions you have.  Whether you are pregnant or may be pregnant. What are the risks? Generally, this is a safe procedure. However, problems may occur, including:  Allergic reactions to medicines.  A blood clot that breaks free and travels to other parts of your body.  The possible return of an abnormal heart rhythm within hours or days after the procedure.  Your heart stopping (cardiac arrest). This is rare. What happens before the procedure? Medicines  Your health care provider may have you start taking: ? Blood-thinning medicines (anticoagulants) so your blood does not clot as easily. ? Medicines to help stabilize your heart rate and rhythm.  Ask your health care provider about: ? Changing or stopping your regular medicines. This is especially important if you are taking diabetes medicines or blood thinners. ? Taking medicines such as aspirin and ibuprofen. These medicines can  thin your blood. Do not take these medicines unless your health care provider tells you to take them. ? Taking over-the-counter medicines, vitamins, herbs, and supplements. General instructions  Follow instructions from your health care provider about eating or drinking restrictions.  Plan to have someone take you home from the hospital or clinic.  If you will be going home right after the procedure, plan to have someone with you for 24 hours.  Ask your health care provider what steps will be taken to help prevent infection. These may include washing your skin with a germ-killing soap. What happens during the procedure?   An IV will be inserted into one of your veins.  Sticky patches (electrodes) or metal paddles may be placed on your chest.  You will be given a medicine to help you relax (sedative).  An electrical shock will be delivered. The procedure may vary among health care providers and hospitals. What can I expect after the procedure?  Your blood pressure, heart rate, breathing rate, and blood oxygen level will be monitored until you leave the hospital or clinic.  Your heart rhythm will be watched to make sure it does not change.  You may have some redness on the skin where the shocks were given. Follow these instructions at home:  Do not drive for 24 hours if you were given a sedative during your procedure.  Take over-the-counter and prescription medicines only as told by your health care provider.  Ask your health care provider how to check your pulse. Check it often.  Rest for 48 hours after the procedure or   as told by your health care provider.  Avoid or limit your caffeine use as told by your health care provider.  Keep all follow-up visits as told by your health care provider. This is important. Contact a health care provider if:  You feel like your heart is beating too quickly or your pulse is not regular.  You have a serious muscle cramp that does not go  away. Get help right away if:  You have discomfort in your chest.  You are dizzy or you feel faint.  You have trouble breathing or you are short of breath.  Your speech is slurred.  You have trouble moving an arm or leg on one side of your body.  Your fingers or toes turn cold or blue. Summary  Electrical cardioversion is the delivery of a jolt of electricity to restore a normal rhythm to the heart.  This procedure may be done right away in an emergency or may be a scheduled procedure if the condition is not an emergency.  Generally, this is a safe procedure.  After the procedure, check your pulse often as told by your health care provider. This information is not intended to replace advice given to you by your health care provider. Make sure you discuss any questions you have with your health care provider. Document Revised: 09/23/2018 Document Reviewed: 09/23/2018 Elsevier Patient Education  2020 [employer]  

## 2019-05-13 NOTE — Transfer of Care (Signed)
Immediate Anesthesia Transfer of Care Note  Patient: Khiem M Doom  Procedure(s) Performed: CARDIOVERSION (N/A )  Patient Location: Endoscopy Unit  Anesthesia Type:General  Level of Consciousness: awake, alert  and oriented  Airway & Oxygen Therapy: Patient Spontanous Breathing  Post-op Assessment: Report given to RN and Post -op Vital signs reviewed and stable  Post vital signs: Reviewed and stable  Last Vitals:  Vitals Value Taken Time  BP 116/43 05/13/19 1114  Temp    Pulse 42 05/13/19 1119  Resp 21 05/13/19 1119  SpO2 100 % 05/13/19 1119  Vitals shown include unvalidated device data.  Last Pain:  Vitals:   05/13/19 1013  TempSrc: Oral  PainSc: 0-No pain         Complications: No apparent anesthesia complications

## 2019-05-13 NOTE — Anesthesia Preprocedure Evaluation (Signed)
Anesthesia Evaluation  Patient identified by MRN, date of birth, ID band Patient awake    Reviewed: Allergy & Precautions, NPO status , Patient's Chart, lab work & pertinent test results, reviewed documented beta blocker date and time   History of Anesthesia Complications Negative for: history of anesthetic complications  Airway Mallampati: II  TM Distance: >3 FB Neck ROM: Full    Dental  (+) Edentulous Upper, Edentulous Lower   Pulmonary neg pulmonary ROS, former smoker,    Pulmonary exam normal        Cardiovascular hypertension, Pt. on medications and Pt. on home beta blockers + dysrhythmias Atrial Fibrillation  Rhythm:Irregular Rate:Normal     Neuro/Psych negative neurological ROS  negative psych ROS   GI/Hepatic negative GI ROS, Neg liver ROS,   Endo/Other  negative endocrine ROSdiabetes, Insulin Dependent  Renal/GU Renal InsufficiencyRenal disease (CKDIII)  negative genitourinary   Musculoskeletal negative musculoskeletal ROS (+)   Abdominal   Peds  Hematology negative hematology ROS (+) anemia ,   Anesthesia Other Findings   Reproductive/Obstetrics                             Anesthesia Physical  Anesthesia Plan  ASA: III  Anesthesia Plan: General   Post-op Pain Management:    Induction: Intravenous  PONV Risk Score and Plan: TIVA and Treatment may vary due to age or medical condition  Airway Management Planned: Mask  Additional Equipment: None  Intra-op Plan:   Post-operative Plan:   Informed Consent: I have reviewed the patients History and Physical, chart, labs and discussed the procedure including the risks, benefits and alternatives for the proposed anesthesia with the patient or authorized representative who has indicated his/her understanding and acceptance.       Plan Discussed with:   Anesthesia Plan Comments:         Anesthesia Quick  Evaluation

## 2019-05-13 NOTE — Anesthesia Postprocedure Evaluation (Signed)
Anesthesia Post Note  Patient: Wesley Garcia  Procedure(s) Performed: CARDIOVERSION (N/A )     Patient location during evaluation: Endoscopy Anesthesia Type: MAC Level of consciousness: awake and alert Pain management: pain level controlled Vital Signs Assessment: post-procedure vital signs reviewed and stable Respiratory status: spontaneous breathing, nonlabored ventilation and respiratory function stable Cardiovascular status: blood pressure returned to baseline and stable Postop Assessment: no apparent nausea or vomiting Anesthetic complications: no    Last Vitals:  Vitals:   05/13/19 1127 05/13/19 1139  BP: (!) 113/47 (!) 122/55  Pulse: (!) 44   Resp: (!) 23   Temp:    SpO2: 97%     Last Pain:  Vitals:   05/13/19 1139  TempSrc:   PainSc: 0-No pain                 Lidia Collum

## 2019-05-13 NOTE — CV Procedure (Signed)
Electrical Cardioversion Procedure Note Wesley Garcia [id number] [birth year]  Procedure: Electrical Cardioversion Indications:  Atrial Fibrillation  Procedure Details Consent: Risks of procedure as well as the alternatives and risks of each were explained to the (patient/caregiver).  Consent for procedure obtained. Time Out: Verified patient identification, verified procedure, site/side was marked, verified correct patient position, special equipment/implants available, medications/allergies/relevent history reviewed, required imaging and test results available.  Performed  Patient placed on cardiac monitor, pulse oximetry, supplemental oxygen as necessary.  Sedation given: propfol Pacer pads placed anterior and posterior chest.  Cardioverted 1 time(s).  Cardioverted at 150J.  Evaluation Findings: Post procedure EKG shows: sinus bradycardia Complications: None Patient did tolerate procedure well.   Skeet Latch, MD 05/13/2019, 11:09 AM

## 2019-05-13 NOTE — Interval H&P Note (Signed)
History and Physical Interval Note:  05/13/2019 10:20 AM  Wesley Garcia  has presented today for surgery, with the diagnosis of AFIB.  The various methods of treatment have been discussed with the patient and family. After consideration of risks, benefits and other options for treatment, the patient has consented to  Procedure(s): CARDIOVERSION (N/A) as a surgical intervention.  The patient's history has been reviewed, patient examined, no change in status, stable for surgery.  I have reviewed the patient's chart and labs.  Questions were answered to the patient's satisfaction.     Skeet Latch, MD

## 2019-05-13 NOTE — Anesthesia Procedure Notes (Signed)
Procedure Name: General with mask airway Date/Time: 05/13/2019 11:05 AM Performed by: Janace Litten, CRNA Pre-anesthesia Checklist: Patient identified, Emergency Drugs available, Suction available, Patient being monitored and Timeout performed Patient Re-evaluated:Patient Re-evaluated prior to induction Oxygen Delivery Method: Ambu bag Preoxygenation: Pre-oxygenation with 100% oxygen Induction Type: IV induction Ventilation: Mask ventilation without difficulty and Oral airway inserted - appropriate to patient size Placement Confirmation: breath sounds checked- equal and bilateral Tube secured with: Tape Dental Injury: Teeth and Oropharynx as per pre-operative assessment

## 2019-05-14 ENCOUNTER — Encounter: Payer: Self-pay | Admitting: *Deleted

## 2019-05-20 ENCOUNTER — Other Ambulatory Visit: Payer: Self-pay

## 2019-05-20 ENCOUNTER — Inpatient Hospital Stay (HOSPITAL_COMMUNITY): Payer: Medicare PPO

## 2019-05-20 ENCOUNTER — Encounter (HOSPITAL_COMMUNITY): Payer: Self-pay | Admitting: Emergency Medicine

## 2019-05-20 ENCOUNTER — Ambulatory Visit (HOSPITAL_BASED_OUTPATIENT_CLINIC_OR_DEPARTMENT_OTHER)
Admission: RE | Admit: 2019-05-20 | Discharge: 2019-05-20 | Disposition: A | Discharge status: Home / Self Care | Payer: Medicare PPO | Source: Ambulatory Visit | Attending: Physician Assistant | Admitting: Physician Assistant

## 2019-05-20 ENCOUNTER — Emergency Department (HOSPITAL_COMMUNITY): Payer: Medicare PPO

## 2019-05-20 ENCOUNTER — Inpatient Hospital Stay (HOSPITAL_COMMUNITY)
Admission: EM | Admit: 2019-05-20 | Discharge: 2019-05-29 | DRG: 242 | Disposition: A | Discharge status: Skilled Nursing Facility | Payer: Medicare PPO | Attending: Interventional Cardiology | Admitting: Interventional Cardiology

## 2019-05-20 VITALS — BP 96/50 | HR 110 | Ht 66.0 in | Wt 155.0 lb

## 2019-05-20 DIAGNOSIS — I10 Essential (primary) hypertension: Secondary | ICD-10-CM | POA: Diagnosis not present

## 2019-05-20 DIAGNOSIS — R079 Chest pain, unspecified: Secondary | ICD-10-CM

## 2019-05-20 DIAGNOSIS — I4891 Unspecified atrial fibrillation: Secondary | ICD-10-CM

## 2019-05-20 DIAGNOSIS — N179 Acute kidney failure, unspecified: Secondary | ICD-10-CM | POA: Diagnosis not present

## 2019-05-20 DIAGNOSIS — E1169 Type 2 diabetes mellitus with other specified complication: Secondary | ICD-10-CM | POA: Diagnosis present

## 2019-05-20 DIAGNOSIS — I248 Other forms of acute ischemic heart disease: Secondary | ICD-10-CM | POA: Diagnosis present

## 2019-05-20 DIAGNOSIS — E785 Hyperlipidemia, unspecified: Secondary | ICD-10-CM | POA: Diagnosis present

## 2019-05-20 DIAGNOSIS — I5023 Acute on chronic systolic (congestive) heart failure: Secondary | ICD-10-CM | POA: Diagnosis present

## 2019-05-20 DIAGNOSIS — I495 Sick sinus syndrome: Secondary | ICD-10-CM

## 2019-05-20 DIAGNOSIS — I4819 Other persistent atrial fibrillation: Principal | ICD-10-CM | POA: Diagnosis present

## 2019-05-20 DIAGNOSIS — Z794 Long term (current) use of insulin: Secondary | ICD-10-CM

## 2019-05-20 DIAGNOSIS — Z841 Family history of disorders of kidney and ureter: Secondary | ICD-10-CM | POA: Diagnosis not present

## 2019-05-20 DIAGNOSIS — N184 Chronic kidney disease, stage 4 (severe): Secondary | ICD-10-CM | POA: Diagnosis present

## 2019-05-20 DIAGNOSIS — Z833 Family history of diabetes mellitus: Secondary | ICD-10-CM | POA: Diagnosis not present

## 2019-05-20 DIAGNOSIS — Z8589 Personal history of malignant neoplasm of other organs and systems: Secondary | ICD-10-CM

## 2019-05-20 DIAGNOSIS — I959 Hypotension, unspecified: Secondary | ICD-10-CM | POA: Diagnosis present

## 2019-05-20 DIAGNOSIS — D6869 Other thrombophilia: Secondary | ICD-10-CM | POA: Diagnosis present

## 2019-05-20 DIAGNOSIS — J90 Pleural effusion, not elsewhere classified: Secondary | ICD-10-CM | POA: Diagnosis present

## 2019-05-20 DIAGNOSIS — R55 Syncope and collapse: Secondary | ICD-10-CM | POA: Diagnosis not present

## 2019-05-20 DIAGNOSIS — I4729 Other ventricular tachycardia: Secondary | ICD-10-CM

## 2019-05-20 DIAGNOSIS — E1122 Type 2 diabetes mellitus with diabetic chronic kidney disease: Secondary | ICD-10-CM | POA: Insufficient documentation

## 2019-05-20 DIAGNOSIS — H9190 Unspecified hearing loss, unspecified ear: Secondary | ICD-10-CM | POA: Diagnosis present

## 2019-05-20 DIAGNOSIS — Z7901 Long term (current) use of anticoagulants: Secondary | ICD-10-CM | POA: Insufficient documentation

## 2019-05-20 DIAGNOSIS — I472 Ventricular tachycardia: Secondary | ICD-10-CM | POA: Diagnosis present

## 2019-05-20 DIAGNOSIS — Z20822 Contact with and (suspected) exposure to covid-19: Secondary | ICD-10-CM | POA: Diagnosis present

## 2019-05-20 DIAGNOSIS — Z808 Family history of malignant neoplasm of other organs or systems: Secondary | ICD-10-CM | POA: Diagnosis not present

## 2019-05-20 DIAGNOSIS — N183 Chronic kidney disease, stage 3 unspecified: Secondary | ICD-10-CM | POA: Insufficient documentation

## 2019-05-20 DIAGNOSIS — I130 Hypertensive heart and chronic kidney disease with heart failure and stage 1 through stage 4 chronic kidney disease, or unspecified chronic kidney disease: Secondary | ICD-10-CM | POA: Insufficient documentation

## 2019-05-20 DIAGNOSIS — I428 Other cardiomyopathies: Secondary | ICD-10-CM | POA: Diagnosis not present

## 2019-05-20 DIAGNOSIS — R531 Weakness: Secondary | ICD-10-CM

## 2019-05-20 DIAGNOSIS — I499 Cardiac arrhythmia, unspecified: Secondary | ICD-10-CM

## 2019-05-20 DIAGNOSIS — L7632 Postprocedural hematoma of skin and subcutaneous tissue following other procedure: Secondary | ICD-10-CM | POA: Diagnosis not present

## 2019-05-20 DIAGNOSIS — E119 Type 2 diabetes mellitus without complications: Secondary | ICD-10-CM

## 2019-05-20 DIAGNOSIS — I5022 Chronic systolic (congestive) heart failure: Secondary | ICD-10-CM | POA: Insufficient documentation

## 2019-05-20 DIAGNOSIS — Z79899 Other long term (current) drug therapy: Secondary | ICD-10-CM

## 2019-05-20 DIAGNOSIS — M7989 Other specified soft tissue disorders: Secondary | ICD-10-CM | POA: Diagnosis not present

## 2019-05-20 DIAGNOSIS — Z87891 Personal history of nicotine dependence: Secondary | ICD-10-CM

## 2019-05-20 DIAGNOSIS — I509 Heart failure, unspecified: Secondary | ICD-10-CM

## 2019-05-20 DIAGNOSIS — R58 Hemorrhage, not elsewhere classified: Secondary | ICD-10-CM | POA: Diagnosis not present

## 2019-05-20 DIAGNOSIS — Z95818 Presence of other cardiac implants and grafts: Secondary | ICD-10-CM

## 2019-05-20 HISTORY — DX: Pleural effusion, not elsewhere classified: J90

## 2019-05-20 LAB — URINALYSIS, ROUTINE W REFLEX MICROSCOPIC
Bacteria, UA: NONE SEEN
Bilirubin Urine: NEGATIVE
Glucose, UA: NEGATIVE mg/dL
Ketones, ur: NEGATIVE mg/dL
Leukocytes,Ua: NEGATIVE
Nitrite: NEGATIVE
Protein, ur: NEGATIVE mg/dL
Specific Gravity, Urine: 1.008 (ref 1.005–1.030)
pH: 6 (ref 5.0–8.0)

## 2019-05-20 LAB — BASIC METABOLIC PANEL
Anion gap: 12 (ref 5–15)
BUN: 25 mg/dL — ABNORMAL HIGH (ref 8–23)
CO2: 26 mmol/L (ref 22–32)
Calcium: 8.6 mg/dL — ABNORMAL LOW (ref 8.9–10.3)
Chloride: 101 mmol/L (ref 98–111)
Creatinine, Ser: 1.86 mg/dL — ABNORMAL HIGH (ref 0.61–1.24)
GFR calc Af Amer: 37 mL/min — ABNORMAL LOW (ref >60)
GFR calc non Af Amer: 32 mL/min — ABNORMAL LOW (ref >60)
Glucose, Bld: 118 mg/dL — ABNORMAL HIGH (ref 70–99)
Potassium: 3.4 mmol/L — ABNORMAL LOW (ref 3.5–5.1)
Sodium: 139 mmol/L (ref 135–145)

## 2019-05-20 LAB — HEPATIC FUNCTION PANEL
ALT: 14 U/L (ref 0–44)
AST: 23 U/L (ref 15–41)
Albumin: 2.8 g/dL — ABNORMAL LOW (ref 3.5–5.0)
Alkaline Phosphatase: 74 U/L (ref 38–126)
Bilirubin, Direct: 0.2 mg/dL (ref 0.0–0.2)
Indirect Bilirubin: 0.7 mg/dL (ref 0.3–0.9)
Total Bilirubin: 0.9 mg/dL (ref 0.3–1.2)
Total Protein: 6 g/dL — ABNORMAL LOW (ref 6.5–8.1)

## 2019-05-20 LAB — CBC
HCT: 46 % (ref 39.0–52.0)
Hemoglobin: 14.4 g/dL (ref 13.0–17.0)
MCH: 29 pg (ref 26.0–34.0)
MCHC: 31.3 g/dL (ref 30.0–36.0)
MCV: 92.6 fL (ref 80.0–100.0)
Platelets: 253 10*3/uL (ref 150–400)
RBC: 4.97 MIL/uL (ref 4.22–5.81)
RDW: 14.8 % (ref 11.5–15.5)
WBC: 8.1 10*3/uL (ref 4.0–10.5)
nRBC: 0 % (ref 0.0–0.2)

## 2019-05-20 LAB — CBG MONITORING, ED: Glucose-Capillary: 80 mg/dL (ref 70–99)

## 2019-05-20 LAB — TSH: TSH: 6.636 u[IU]/mL — ABNORMAL HIGH (ref 0.350–4.500)

## 2019-05-20 LAB — BRAIN NATRIURETIC PEPTIDE: B Natriuretic Peptide: 1671.4 pg/mL — ABNORMAL HIGH (ref 0.0–100.0)

## 2019-05-20 LAB — TROPONIN I (HIGH SENSITIVITY)
Troponin I (High Sensitivity): 36 ng/L — ABNORMAL HIGH (ref <18)
Troponin I (High Sensitivity): 34 ng/L — ABNORMAL HIGH (ref <18)

## 2019-05-20 LAB — SARS CORONAVIRUS 2 (TAT 6-24 HRS): SARS Coronavirus 2: NEGATIVE

## 2019-05-20 MED ORDER — TAMSULOSIN HCL 0.4 MG PO CAPS
0.4000 mg | ORAL_CAPSULE | Freq: Every day | ORAL | Status: DC
  Administered 2019-05-20 – 2019-05-29 (×10): 0.4 mg via ORAL
  Filled 2019-05-20 (×10): qty 1

## 2019-05-20 MED ORDER — INSULIN ASPART 100 UNIT/ML ~~LOC~~ SOLN
0.0000 [IU] | Freq: Three times a day (TID) | SUBCUTANEOUS | Status: DC
  Administered 2019-05-22: 3 [IU] via SUBCUTANEOUS

## 2019-05-20 MED ORDER — SODIUM CHLORIDE 0.9% FLUSH: 3.0000 mL | INTRAVENOUS | Status: DC | PRN

## 2019-05-20 MED ORDER — METOPROLOL TARTRATE 12.5 MG HALF TABLET
12.5000 mg | ORAL_TABLET | Freq: Two times a day (BID) | ORAL | Status: DC
  Administered 2019-05-20 – 2019-05-21 (×3): 12.5 mg via ORAL
  Filled 2019-05-20 (×4): qty 1

## 2019-05-20 MED ORDER — METOPROLOL TARTRATE 25 MG PO TABS: ORAL_TABLET | ORAL | 1 refills | Status: DC

## 2019-05-20 MED ORDER — FUROSEMIDE 10 MG/ML IJ SOLN
40.0000 mg | Freq: Once | INTRAMUSCULAR | Status: AC
  Administered 2019-05-20: 40 mg via INTRAVENOUS
  Filled 2019-05-20: qty 4

## 2019-05-20 MED ORDER — RIVAROXABAN 15 MG PO TABS
15.0000 mg | ORAL_TABLET | Freq: Every evening | ORAL | Status: DC
  Administered 2019-05-20 – 2019-05-21 (×2): 15 mg via ORAL
  Filled 2019-05-20 (×2): qty 1

## 2019-05-20 MED ORDER — INSULIN GLARGINE 100 UNIT/ML ~~LOC~~ SOLN
15.0000 [IU] | Freq: Every day | SUBCUTANEOUS | Status: DC
  Filled 2019-05-20: qty 0.15

## 2019-05-20 MED ORDER — ALPRAZOLAM 0.25 MG PO TABS
0.2500 mg | ORAL_TABLET | Freq: Two times a day (BID) | ORAL | Status: DC | PRN
  Administered 2019-05-25: 0.25 mg via ORAL
  Filled 2019-05-20: qty 1

## 2019-05-20 MED ORDER — LINAGLIPTIN 5 MG PO TABS
5.0000 mg | ORAL_TABLET | Freq: Every day | ORAL | Status: DC
  Administered 2019-05-22 – 2019-05-29 (×8): 5 mg via ORAL
  Filled 2019-05-20 (×8): qty 1

## 2019-05-20 MED ORDER — ZOLPIDEM TARTRATE 5 MG PO TABS
5.0000 mg | ORAL_TABLET | Freq: Every evening | ORAL | Status: DC | PRN
  Administered 2019-05-20 – 2019-05-24 (×4): 5 mg via ORAL
  Filled 2019-05-20 (×4): qty 1

## 2019-05-20 MED ORDER — NITROGLYCERIN 0.4 MG SL SUBL: 0.4000 mg | SUBLINGUAL_TABLET | SUBLINGUAL | Status: DC | PRN

## 2019-05-20 MED ORDER — AMIODARONE HCL IN DEXTROSE 360-4.14 MG/200ML-% IV SOLN
60.0000 mg/h | INTRAVENOUS | Status: DC
  Administered 2019-05-20: 60 mg/h via INTRAVENOUS
  Filled 2019-05-20: qty 200

## 2019-05-20 MED ORDER — SODIUM CHLORIDE 0.9 % IV BOLUS
500.0000 mL | Freq: Once | INTRAVENOUS | Status: AC
  Administered 2019-05-20: 500 mL via INTRAVENOUS

## 2019-05-20 MED ORDER — OCUVITE-LUTEIN PO CAPS
1.0000 | ORAL_CAPSULE | Freq: Every day | ORAL | Status: DC
  Filled 2019-05-20: qty 1

## 2019-05-20 MED ORDER — SODIUM CHLORIDE 0.9% FLUSH
3.0000 mL | Freq: Once | INTRAVENOUS | Status: AC
  Administered 2019-05-20: 3 mL via INTRAVENOUS

## 2019-05-20 MED ORDER — SODIUM CHLORIDE 0.9% FLUSH
3.0000 mL | Freq: Two times a day (BID) | INTRAVENOUS | Status: DC
  Administered 2019-05-21 – 2019-05-27 (×8): 3 mL via INTRAVENOUS

## 2019-05-20 MED ORDER — POTASSIUM CHLORIDE CRYS ER 20 MEQ PO TBCR
40.0000 meq | EXTENDED_RELEASE_TABLET | Freq: Once | ORAL | Status: AC
  Administered 2019-05-20: 40 meq via ORAL
  Filled 2019-05-20: qty 2

## 2019-05-20 MED ORDER — SODIUM CHLORIDE 0.9% FLUSH
3.0000 mL | Freq: Two times a day (BID) | INTRAVENOUS | Status: DC
  Administered 2019-05-20 – 2019-05-28 (×12): 3 mL via INTRAVENOUS

## 2019-05-20 MED ORDER — ONDANSETRON HCL 4 MG/2ML IJ SOLN: 4.0000 mg | Freq: Four times a day (QID) | INTRAMUSCULAR | Status: DC | PRN

## 2019-05-20 MED ORDER — AMIODARONE HCL IN DEXTROSE 360-4.14 MG/200ML-% IV SOLN
30.0000 mg/h | INTRAVENOUS | Status: DC
  Administered 2019-05-21: 30 mg/h via INTRAVENOUS
  Filled 2019-05-20: qty 200

## 2019-05-20 MED ORDER — ACETAMINOPHEN 325 MG PO TABS
650.0000 mg | ORAL_TABLET | ORAL | Status: DC | PRN
  Administered 2019-05-21: 650 mg via ORAL
  Filled 2019-05-20: qty 2

## 2019-05-20 MED ORDER — OCUVITE ADULT 50+ PO CAPS: 1.0000 | ORAL_CAPSULE | Freq: Every day | ORAL | Status: DC

## 2019-05-20 MED ORDER — PANTOPRAZOLE SODIUM 40 MG PO TBEC
40.0000 mg | DELAYED_RELEASE_TABLET | Freq: Two times a day (BID) | ORAL | Status: DC
  Administered 2019-05-20 – 2019-05-29 (×19): 40 mg via ORAL
  Filled 2019-05-20 (×19): qty 1

## 2019-05-20 MED ORDER — AMIODARONE LOAD VIA INFUSION
150.0000 mg | Freq: Once | INTRAVENOUS | Status: AC
  Administered 2019-05-20: 150 mg via INTRAVENOUS
  Filled 2019-05-20: qty 83.34

## 2019-05-20 MED ORDER — SODIUM CHLORIDE 0.9 % IV SOLN: 250.0000 mL | INTRAVENOUS | Status: DC | PRN

## 2019-05-20 MED ORDER — INSULIN GLARGINE (2 UNIT DIAL) 300 UNIT/ML ~~LOC~~ SOPN: 15.0000 [IU] | PEN_INJECTOR | Freq: Every morning | SUBCUTANEOUS | Status: DC

## 2019-05-20 NOTE — ED Provider Notes (Signed)
This patient is an ill-appearing 86 year old male who is chronically ill with congestive heart failure, poor ejection fraction of 20%, he has known atrial fibrillation and unfortunately despite being cardioverted multiple times this year continues to go back into A. fib.  He presented to the A. fib clinic with rapid A. fib and hypotension, he was ill-appearing and transferred to the hospital by ambulance.  The patient is hard of hearing, he is a very poor historian, he is able to tell me that the only other complaint that he has is some lightheadedness, some chest discomfort pointing to the right side of his chest as well as swelling in his legs which she states is chronic since breaking his hip.  He states he takes the medications when his wife gives him to them, he does not miss his medications.  I have reviewed the patient's medical record to corroborate the above history and information.  On exam this patient does have bilateral symmetrical edema of the legs, he is in rapid A. fib, he speaks in shortened sentences but has clear lung sounds and no JVD.  Abdomen is soft and nontender, cardiac exam she is consistent with A. fib, weak pulses at the radial arteries  Vital signs reviewed showing hypotension at 85/70, heart rate of 80-110 in rapid A. fib, oxygen of 90 to 93%.  Chest x-ray, labs, EKG, cardiac consultation with cardiology.  Anticipate admission to high level of care, this patient is critically ill.  We will cardiovert as needed if the patient becomes more unstable but at this time is awake alert and has pulses at the radial arteries.  4:11 PM Cardiac monitoring reveals afib rapid at 110  (Rate & rhythm), as reviewed and interpreted by me. Cardiac monitoring was ordered due to arrhythmia, CP and to monitor patient for dysrhythmia.  Paged cardiology for [place], they returned the call at 4:30 PM, discussed with Wannetta Sender, she will arrange for cardiology evaluation in the emergency department.   The patient will be given a 500 cc bolus.  .Critical Care Performed by: Noemi Chapel, MD Authorized by: Noemi Chapel, MD   Critical care provider statement:    Critical care time (minutes):  35   Critical care time was exclusive of:  Separately billable procedures and treating other patients and teaching time   Critical care was necessary to treat or prevent imminent or life-threatening deterioration of the following conditions:  Cardiac failure   Critical care was time spent personally by me on the following activities:  Blood draw for specimens, development of treatment plan with patient or surrogate, discussions with consultants, evaluation of patient's response to treatment, examination of patient, obtaining history from patient or surrogate, ordering and performing treatments and interventions, ordering and review of laboratory studies, ordering and review of radiographic studies, pulse oximetry, re-evaluation of patient's condition and review of old charts     Wesley Garcia was evaluated in Emergency Department on 05/20/2019 for the symptoms described in the history of present illness. He was evaluated in the context of the global COVID-19 pandemic, which necessitated consideration that the patient might be at risk for infection with the SARS-CoV-2 virus that causes COVID-19. Institutional protocols and algorithms that pertain to the evaluation of patients at risk for COVID-19 are in a state of rapid change based on information released by regulatory bodies including the CDC and federal and state organizations. These policies and algorithms were followed during the patient's care in the ED.  Medical screening examination/treatment/procedure(s) were conducted  as a shared visit with non-physician practitioner(s) and myself.  I personally evaluated the patient during the encounter.  Clinical Impression:   Final diagnoses:  Atrial fibrillation with RVR ([place])  Acute on chronic congestive  heart failure, unspecified heart failure type (HCC)         Noemi Chapel, MD 05/22/19 1531

## 2019-05-20 NOTE — H&P (Addendum)
Cardiology Admission History and Physical:   Patient ID: Wesley Garcia; MRN: [id number]; DOB: Jun 30, 1929   Admission date: 05/20/2019  Primary Care Provider: Gayland Curry, DO Primary Cardiologist: Larae Grooms, MD 05/12/2019 Primary Electrophysiologist: None   Has been to the A. fib clinic multiple times, but has never seen an EP physician  Chief Complaint: Atrial fibrillation  Patient Profile:   Wesley Garcia is a 86 y.o. male with a history of CKD IV, DM, HTN, squamous cell carcinoma of hard palate, fall with hip fracture s/p surgical repair 02/27/19, and persistent atrial fibrillation dx 03/14/2019, on Xarelto for CHA2DS2-VASc of 4, s/p DCCV 04/07/2019, recurrent A. fib and started on amiodarone 04/14/2019, repeat cardioversion on 05/12/2019  History of Present Illness:   Mr. Gauger has had recurrence of atrial fibrillation within a few days of each cardioversion.  When he is in the atrial fibrillation, he feels weak and has significantly increased dyspnea on exertion from baseline.  He has had some lower extremity edema.  He may have mild orthopnea, but denies PND.  When he is in atrial fibrillation, he gets chest pain in the upper right and upper left chest.  The weakness has progressed to the point that he is unable to stand unaided.  He is not currently having chest pain.  He is aware of the atrial fibrillation, can feel the palpitations as well as the dyspnea on exertion and leg weakness.  He is not short of breath at rest, but cannot move himself around in the bed without feeling some shortness of breath.   Past Medical History:  Diagnosis Date  . Chronic kidney disease (CKD), stage III (moderate)   . Diabetes mellitus   . Diverticulosis   . Hard of hearing   . Hemorrhoids   . Hx of adenomatous colonic polyps 2006  . Hypertension   . Persistent atrial fibrillation ([place]) 03/2019  . Pleural effusion 05/20/2019  . Squamous cell carcinoma of hard palate ([place]) 2003     Past Surgical History:  Procedure Laterality Date  . CARDIOVERSION N/A 04/07/2019   Procedure: CARDIOVERSION;  Surgeon: Josue Hector, MD;  Location[facility] ENDOSCOPY;  Service: Cardiovascular;  Laterality: N/A;  . CARDIOVERSION N/A 05/13/2019   Procedure: CARDIOVERSION;  Surgeon: Skeet Latch, MD;  Location: [facility];  Service: Cardiovascular;  Laterality: N/A;  . CHOLECYSTECTOMY N/A 04/17/2014   Procedure: LAPAROSCOPIC CHOLECYSTECTOMY WITH INTRAOPERATIVE CHOLANGIOGRAM;  Surgeon: Erroll Luna, MD;  Location: [facility];  Service: General;  Laterality: N/A;  . COLONOSCOPY  11/02/2007  . MAXILLECTOMY Right 2003   SCCA alveolar ridge     Medications Prior to Admission: Prior to Admission medications   Medication Sig Start Date End Date Taking? Authorizing Provider  ACCU-CHEK SOFTCLIX LANCETS lancets 100 each by Other route 3 (three) times daily. Use as instructed Dx:E11.22 10/16/17   Reed, Tiffany L, DO  acetaminophen (TYLENOL) 500 MG tablet Take 1,000 mg by mouth every 6 (six) hours as needed for moderate pain.     [provider]  amiodarone (PACERONE) 200 MG tablet Take 200 mg by mouth daily.    [provider]  Blood Glucose Monitoring Suppl (ACCU-CHEK AVIVA PLUS) w/Device KIT 1 Units by Does not apply route as directed. Use as directed to check blood sugar. Dx:E11.22 10/16/17   Reed, Tiffany L, DO  EASY COMFORT PEN NEEDLES 31G X 5 MM MISC use as directed TO INJECT INSULIN TO CONTROL BLOOD SUGAR 08/31/14   [provider]  furosemide (LASIX) 20  MG tablet Take 1 tablet by mouth daily as needed for weight gain/swelling 05/06/19   Fenton, Clint R, PA  glucose blood (ACCU-CHEK AVIVA) test strip Use to test blood sugar three times daily. Dx:E11.22 10/10/18   Reed, Tiffany L, DO  insulin glargine, 2 Unit Dial, (TOUJEO MAX SOLOSTAR) 300 UNIT/ML Solostar Pen Inject 15 Units into the skin every morning.    [provider]  linagliptin (TRADJENTA) 5 MG TABS tablet  Take 1 tablet (5 mg total) by mouth daily. 01/23/19   Reed, Tiffany L, DO  metoprolol tartrate (LOPRESSOR) 25 MG tablet Taking 1/2 tablet by mouth twice daily 05/20/19   Fenton, Maynard R, PA  Multiple Vitamins-Minerals (OCUVITE ADULT 50+) CAPS Take 1 capsule by mouth daily.    [provider]  pantoprazole (PROTONIX) 40 MG tablet TAKE 1 TABLET (40 MG TOTAL) BY MOUTH 2 (TWO) TIMES DAILY BEFORE A MEAL. 02/12/19   Reed, Tiffany L, DO  Rivaroxaban (XARELTO) 15 MG TABS tablet Take 1 tablet (15 mg total) by mouth daily. 03/14/19   Reed, Tiffany L, DO  tamsulosin (FLOMAX) 0.4 MG CAPS capsule Take 1 capsule (0.4 mg total) by mouth at bedtime. 03/10/19   Reed, Tiffany L, DO     Allergies:   No Known Allergies  Social History:   Social History   Socioeconomic History  . Marital status: Married    Spouse name: Not on file  . Number of children: 5  . Years of education: Not on file  . Highest education level: Not on file  Occupational History  . Occupation: Retired  Tobacco Use  . Smoking status: Former Smoker    Types: Cigars    Quit date: 04/07/2000    Years since quitting: 19.1  . Smokeless tobacco: Never Used  Substance and Sexual Activity  . Alcohol use: Not Currently    Comment: socially  . Drug use: No  . Sexual activity: Never  Other Topics Concern  . Not on file  Social History Narrative   Retired, married   Social Determinants of Health   Financial Resource Strain:   . Difficulty of Paying Living Expenses:   Food Insecurity:   . Worried About Charity fundraiser in the Last Year:   . Arboriculturist in the Last Year:   Transportation Needs:   . Film/video editor (Medical):   Marland Kitchen Lack of Transportation (Non-Medical):   Physical Activity:   . Days of Exercise per Week:   . Minutes of Exercise per Session:   Stress:   . Feeling of Stress :   Social Connections:   . Frequency of Communication with Friends and Family:   . Frequency of Social Gatherings with Friends  and Family:   . Attends Religious Services:   . Active Member of Clubs or Organizations:   . Attends Archivist Meetings:   Marland Kitchen Marital Status:   Intimate Partner Violence:   . Fear of Current or Ex-Partner:   . Emotionally Abused:   Marland Kitchen Physically Abused:   . Sexually Abused:     Family History:  The patient's family history includes Bone cancer in his sister; Cancer in his mother; Diabetes in his brother; Kidney disease in his sister. There is no history of Colon cancer.   The patient He indicated that the status of his mother is unknown. He indicated that one of his three sisters is deceased. He indicated that the status of his brother is unknown. He indicated that the  status of his neg hx is unknown.  ROS:  Please see the history of present illness.  All other ROS reviewed and negative.     Physical Exam/Data:   Vitals:   05/20/19 1535 05/20/19 1613  BP: (!) 85/71 128/80  Pulse: 81 (!) 102  Resp: 18 20  Temp: 97.6 F (36.4 C)   TempSrc: Oral   SpO2: 90% 92%   No intake or output data in the 24 hours ending 05/20/19 1715 There were no vitals filed for this visit. There is no height or weight on file to calculate BMI.  General: frail, elderly, male in no acute distress HEENT: normal for age Lymph: no adenopathy Neck:  JVD mildly elevated Endocrine:  No thryomegaly Vascular: No carotid bruits; upper extremity pulses 2+ bilaterally  Cardiac:  normal S1, S2; irregular rate and rhythm, no significant murmur, no rub or gallop  Lungs: Decreased breath sounds bases bilaterally, no wheezing, rhonchi, few rales  Abd: soft, nontender, no hepatomegaly  Ext: 1-2+ lower extremity edema Musculoskeletal:  No deformities, BUE and BLE strength normal and equal Skin: warm and dry  Neuro:  CNs 2-12 intact, no focal abnormalities noted Psych:  Normal affect    EKG:  The ECG was personally reviewed: 3/16 ECG is atrial fibrillation, heart rate 102, no acute ischemic  changes Telemetry: Atrial fibrillation, RVR at times, 3 and 4 beat runs of nonsustained VT noted  Relevant CV Studies:  ECHO: 04/21/2019  1. Septal apical distal anterior wall mid and apical inferior wall  hypokinesis Basal function preserved Abnormal GLS -6. Left ventricular  ejection fraction, by estimation, is 25%. The left ventricle has severely  decreased function. The left ventricle  demonstrates regional wall motion abnormalities (see scoring  diagram/findings for description). The left ventricular internal cavity  size was moderately to severely dilated. Left ventricular diastolic  parameters are indeterminate.  2. Right ventricular systolic function is normal. The right ventricular  size is normal. There is moderately elevated pulmonary artery systolic  pressure.  3. Left atrial size was mildly dilated.  4. The mitral valve is normal in structure and function. Mild mitral  valve regurgitation. No evidence of mitral stenosis.  5. The aortic valve is tricuspid. Aortic valve regurgitation is trivial.  Mild aortic valve sclerosis is present, with no evidence of aortic valve  stenosis.  6. The inferior vena cava is dilated in size with >50% respiratory  variability, suggesting right atrial pressure of 8 mmHg.     Laboratory Data:  Chemistry Recent Labs  Lab 05/20/19 1552  NA 139  K 3.4*  CL 101  CO2 26  GLUCOSE 118*  BUN 25*  CREATININE 1.86*  CALCIUM 8.6*  GFRNONAA 32*  GFRAA 37*  ANIONGAP 12    No results for input(s): PROT, ALBUMIN, AST, ALT, ALKPHOS, BILITOT in the last 168 hours. Hematology Recent Labs  Lab 05/20/19 1552  WBC 8.1  RBC 4.97  HGB 14.4  HCT 46.0  MCV 92.6  MCH 29.0  MCHC 31.3  RDW 14.8  PLT 253   Cardiac Enzymes  High Sensitivity Troponin:   Recent Labs  Lab 05/20/19 1552  TROPONINIHS 36*     BNPNo results for input(s): BNP, PROBNP in the last 168 hours.   Lipids:  Lab Results  Component Value Date   CHOL 86  10/01/2018   HDL 47 10/01/2018   LDLCALC 25 10/01/2018   TRIG 49 10/01/2018   CHOLHDL 1.8 10/01/2018   INR: No results found for: INR,  PROTIME A1c:  Lab Results  Component Value Date   HGBA1C 6.9 (H) 04/10/2019   Thyroid:  Lab Results  Component Value Date   TSH 4.06 04/10/2019    Radiology/Studies:  [facility] Chest Port 1 View  Result Date: 05/20/2019 CLINICAL DATA:  Chest pain shortness of breath. EXAM: PORTABLE CHEST 1 VIEW COMPARISON:  04/16/2014 FINDINGS: There are moderate-sized bilateral pleural effusions with adjacent airspace opacities favored to represent compressive atelectasis. There is no convincing pneumothorax. There is a lucency along the left heart border which is felt to represent artifact. There is no acute osseous abnormality. The heart size appears borderline enlarged but is difficult to fully evaluate. Aortic calcifications are noted. IMPRESSION: Moderate-sized bilateral pleural effusions with adjacent airspace opacities favored to represent compressive atelectasis. Electronically Signed   By: Constance Holster M.D.   On: 05/20/2019 16:14    Assessment and Plan:   1.  Persistent atrial fibrillation, RVR -He has been on amiodarone since 04/14/2019, but only stayed in sinus rhythm a couple of days after his recent cardioversion -Discuss with MD if we should try IV amiodarone or see if a different antiarrhythmic would be more effective -He has had bradycardia at times on beta-blockers, so no dose change on BB for now  2.  Pleural effusions -Will order 2 view chest x-ray to further evaluate.   -The left side may be worse than the right, we may need a decubitus film to see if he might benefit from thoracentesis  3.  Diabetes: -Continue home medications with daytime only sliding scale insulin  4.  Hypertension -Initial blood pressure systolic was 65K on arrival to the ER. -His only blood pressure lowering medication is metoprolol 25 mg twice daily and as needed  Lasix -Continue to follow blood pressure  5.  Acute on chronic systolic CHF -He has volume overload by exam -Today, his BUN is about normal for him, but his creatinine is actually below normal for him. -Discuss IV Lasix with MD -Track daily weights and BMET  6. NSVT - Pt have frequent short runs of NSVT - will supp K+ to get to 4.0 - ck Mg and supp that as well  Otherwise, continue home medications Principal Problem:   Persistent atrial fibrillation (HCC) Active Problems:   DM (diabetes mellitus) type II controlled with renal manifestation (HCC)   Benign essential hypertension   Pleural effusion     For questions or updates, please contact [facility] Please consult [email or web address] for contact info under Cardiology/STEMI.    Signed, Rosaria Ferries, PA-C  05/20/2019 5:15 PM   I have seen, examined the patient, and reviewed the above assessment and plan.  Changes to above are made where necessary.  On exam, elderly and frail appearing,  iRRR.  The patient has medicine refractory afib.  He has sinus bradycardia and hypotension during afib which limit rate control.  He also has worsening CHF, likely due to afib with RVR.  He has been closely managed in the afib clinic but has failed outpatient management.  I think the next best step for him is to proceed with AV nodal ablation and pacemaker implantation.  Could consider LV lead placement given reduced EF.  Given his advanced age, fragility and DNR status, he is not a candidate for an ICD.  Dr Curt Bears to see in am and discuss AV nodal ablation further.  We will admit to the cardiology service.   Co Sign: Thompson Grayer, MD 05/20/2019 9:21 PM

## 2019-05-20 NOTE — Progress Notes (Signed)
Primary Care Physician: Gayland Curry, DO Primary Cardiologist: Dr Irish Lack (new) Primary Electrophysiologist: none Referring Physician: Dr Larena Glassman is a 86 y.o. male with a history of CKD, DM, HTN, squamous cell carcinoma of hard palate, fall with hip fracture s/p surgical repair 02/27/19, and persistent atrial fibrillation who presents for follow up in the [facility]. The patient was initially diagnosed with atrial fibrillation on 03/14/19 after presenting to his PCP office after an abnormal ECG by home health. ECG confirmed afib with RVR. His Xarelto was increased to 15 mg daily for a CHADS2VASC score of 4. He was also started on Lopressor for rate control. On follow up with PCP 03/24/19 patient was still in afib with RVR with symptoms of fatigue. Patient is s/p DCCV on 04/07/19. Patient reports that he initially felt better for a few days after his DCCV with less SOB. Unfortunately, on follow up with his PCP on 04/10/19, he was back in afib with RVR with symptoms of SOB and palpitations. He was started on amiodarone 04/14/19.  On follow up today, patient is s/p DCCV on 05/13/19. Patient felt well for about 1-2 days post DCCV but since then has felt worse. He is back in afib with RVR today with symptoms of SOB, leg edema, and extreme weakness. He was unable to stand to weigh today. He admits he did not eat lunch today per his family. He has a laceration on his left arm from a fall one week ago. He denies head trauma.   Today, he denies symptoms of orthopnea, PND, presyncope, syncope, snoring, daytime somnolence, bleeding, or neurologic sequela. The patient is tolerating medications without difficulties and is otherwise without complaint today.    Atrial Fibrillation Risk Factors:  he does not have symptoms or diagnosis of sleep apnea.   he has a BMI of Body mass index is 25.02 kg/m.Marland Kitchen Filed Weights   05/20/19 1453  Weight: 70.3 kg    Family History    Problem Relation Age of Onset  . Cancer Mother        ?  Marland Kitchen Bone cancer Sister   . Diabetes Brother   . Kidney disease Sister   . Colon cancer Neg Hx      Atrial Fibrillation Management history:  Previous antiarrhythmic drugs: amiodarone Previous cardioversions: 04/07/19, 05/13/19 Previous ablations: none CHADS2VASC score: 4 Anticoagulation history: Xarelto (renal dosing)   Past Medical History:  Diagnosis Date  . Chronic kidney disease (CKD), stage III (moderate)   . Diabetes mellitus   . Diverticulosis   . Hard of hearing   . Hemorrhoids   . Hx of adenomatous colonic polyps 2006  . Hypertension   . Squamous cell carcinoma of hard palate ([place]) 2003   Past Surgical History:  Procedure Laterality Date  . CARDIOVERSION N/A 04/07/2019   Procedure: CARDIOVERSION;  Surgeon: Josue Hector, MD;  Location: [facility] ENDOSCOPY;  Service: Cardiovascular;  Laterality: N/A;  . CARDIOVERSION N/A 05/13/2019   Procedure: CARDIOVERSION;  Surgeon: Skeet Latch, MD;  Location: [place];  Service: Cardiovascular;  Laterality: N/A;  . CHOLECYSTECTOMY N/A 04/17/2014   Procedure: LAPAROSCOPIC CHOLECYSTECTOMY WITH INTRAOPERATIVE CHOLANGIOGRAM;  Surgeon: Erroll Luna, MD;  Location: [place];  Service: General;  Laterality: N/A;  . COLONOSCOPY  11/02/2007  . MAXILLECTOMY Right 2003   SCCA alveolar ridge    Current Outpatient Medications  Medication Sig Dispense Refill  . ACCU-CHEK SOFTCLIX LANCETS lancets 100 each by Other route 3 (three)  times daily. Use as instructed Dx:E11.22 100 each 3  . acetaminophen (TYLENOL) 500 MG tablet Take 1,000 mg by mouth every 6 (six) hours as needed for moderate pain.     Marland Kitchen amiodarone (PACERONE) 200 MG tablet Take 200 mg by mouth daily.    . Blood Glucose Monitoring Suppl (ACCU-CHEK AVIVA PLUS) w/Device KIT 1 Units by Does not apply route as directed. Use as directed to check blood sugar. Dx:E11.22 1 kit 0  . EASY COMFORT PEN NEEDLES 31G X 5 MM MISC use as directed  TO INJECT INSULIN TO CONTROL BLOOD SUGAR  11  . furosemide (LASIX) 20 MG tablet Take 1 tablet by mouth daily as needed for weight gain/swelling 30 tablet 1  . glucose blood (ACCU-CHEK AVIVA) test strip Use to test blood sugar three times daily. Dx:E11.22 300 each 3  . insulin glargine, 2 Unit Dial, (TOUJEO MAX SOLOSTAR) 300 UNIT/ML Solostar Pen Inject 15 Units into the skin every morning.    . linagliptin (TRADJENTA) 5 MG TABS tablet Take 1 tablet (5 mg total) by mouth daily. 90 tablet 1  . metoprolol tartrate (LOPRESSOR) 25 MG tablet Taking 1/2 tablet by mouth twice daily 90 tablet 1  . Multiple Vitamins-Minerals (OCUVITE ADULT 50+) CAPS Take 1 capsule by mouth daily.    . pantoprazole (PROTONIX) 40 MG tablet TAKE 1 TABLET (40 MG TOTAL) BY MOUTH 2 (TWO) TIMES DAILY BEFORE A MEAL. 180 tablet 0  . Rivaroxaban (XARELTO) 15 MG TABS tablet Take 1 tablet (15 mg total) by mouth daily. 30 tablet 5  . tamsulosin (FLOMAX) 0.4 MG CAPS capsule Take 1 capsule (0.4 mg total) by mouth at bedtime. 90 capsule 1   No current facility-administered medications for this encounter.    No Known Allergies  Social History   Socioeconomic History  . Marital status: Married    Spouse name: Not on file  . Number of children: 5  . Years of education: Not on file  . Highest education level: Not on file  Occupational History  . Occupation: Retired  Tobacco Use  . Smoking status: Former Smoker    Types: Cigars    Quit date: 04/07/2000    Years since quitting: 19.1  . Smokeless tobacco: Never Used  Substance and Sexual Activity  . Alcohol use: Not Currently    Comment: socially  . Drug use: No  . Sexual activity: Never  Other Topics Concern  . Not on file  Social History Narrative   Retired, married   Social Determinants of Health   Financial Resource Strain:   . Difficulty of Paying Living Expenses:   Food Insecurity:   . Worried About Charity fundraiser in the Last Year:   . Arboriculturist in the  Last Year:   Transportation Needs:   . Film/video editor (Medical):   Marland Kitchen Lack of Transportation (Non-Medical):   Physical Activity:   . Days of Exercise per Week:   . Minutes of Exercise per Session:   Stress:   . Feeling of Stress :   Social Connections:   . Frequency of Communication with Friends and Family:   . Frequency of Social Gatherings with Friends and Family:   . Attends Religious Services:   . Active Member of Clubs or Organizations:   . Attends Archivist Meetings:   Marland Kitchen Marital Status:   Intimate Partner Violence:   . Fear of Current or Ex-Partner:   . Emotionally Abused:   Marland Kitchen Physically Abused:   .  Sexually Abused:      ROS- All systems are reviewed and negative except as per the HPI above.  Physical Exam: Vitals:   05/20/19 1453  BP: (!) 96/50  Pulse: (!) 110  Weight: 70.3 kg  Height: 5' 6"  (1.676 m)    GEN- The patient is ill appearing elderly male, alert and oriented x 3 today.   HEENT-head normocephalic, atraumatic, sclera clear, conjunctiva pink, hearing intact, trachea midline. Lungs- Clear to ausculation bilaterally, labored work of breathing Heart- irregular rate and rhythm, tachycardia, no murmurs, rubs or gallops  GI- soft, NT, ND, + BS Extremities- no clubbing, cyanosis. 2+ bilateral edema MS- no significant deformity or atrophy Skin- no rash or lesion Psych- euthymic mood, full affect Neuro- strength and sensation are intact   Wt Readings from Last 3 Encounters:  05/20/19 70.3 kg  05/13/19 70.3 kg  05/12/19 70.3 kg    EKG today demonstrates afib HR 110, QRS 72, QTc 487  Echo 04/21/19 demonstrated 1. Septal apical distal anterior wall mid and apical inferior wall  hypokinesis Basal function preserved Abnormal GLS -6. Left ventricular  ejection fraction, by estimation, is 25%. The left ventricle has severely  decreased function. The left ventricle  demonstrates regional wall motion abnormalities (see scoring    diagram/findings for description). The left ventricular internal cavity  size was moderately to severely dilated. Left ventricular diastolic  parameters are indeterminate.  2. Right ventricular systolic function is normal. The right ventricular  size is normal. There is moderately elevated pulmonary artery systolic  pressure.  3. Left atrial size was mildly dilated.  4. The mitral valve is normal in structure and function. Mild mitral  valve regurgitation. No evidence of mitral stenosis.  5. The aortic valve is tricuspid. Aortic valve regurgitation is trivial.  Mild aortic valve sclerosis is present, with no evidence of aortic valve  stenosis.  6. The inferior vena cava is dilated in size with >50% respiratory  variability, suggesting right atrial pressure of 8 mmHg.   Epic records are reviewed at length today  CHA2DS2-VASc Score = 4 The patient's score is based upon: CHF History: No HTN History: Yes Age : 85 + Diabetes History: Yes Stroke History: No Vascular Disease History: No Gender: Male     ASSESSMENT AND PLAN: 1. Persistent Atrial Fibrillation (ICD10:  I48.19) The patient's CHA2DS2-VASc score is 4, indicating a 4.8% annual risk of stroke.   S/p DCCV on 04/07/19. Unfortunately he had ERAF. Patient is back in rapid afib again today and is hypotensive with weakness. I am concerned for further decompensation.  Continue amiodarone 200 mg daily for now Continue Xarelto 15 mg daily.  Continue metoprolol to 12.5 mg BID ? Tachy brady syndrome.  2. Secondary Hypercoagulable State (ICD10:  D68.69) The patient is at significant risk for stroke/thromboembolism based upon his CHA2DS2-VASc Score of 4.  Continue Rivaroxaban (Xarelto).   3. HTN Low today, see plans above.  4. Systolic CHF Echo showed EF 25%. Suspect tachycardia mediated.  2+ edema in lower extremities today.    Will send patient to ED given his rapid rates and hypotension.    [facility] [address] [place], [place] [address] [phone] 05/20/2019 3:41 PM

## 2019-05-20 NOTE — ED Notes (Signed)
Condom cath placed on pt 

## 2019-05-20 NOTE — ED Triage Notes (Signed)
Patient arrives to ED with complaints of SOB, leg edema, extreme weakness, and persistent rapid Afib RVR for the past week. Patient states that he had a DCCV on 3/9. Patient is unable to stand in triage and is hypotensive.

## 2019-05-20 NOTE — ED Provider Notes (Signed)
[facility] EMERGENCY DEPARTMENT Provider Note   CSN: [id number] Arrival date & time: 05/20/19  1530     History Chief Complaint  Patient presents with  . Weakness    Wesley Garcia is a 86 y.o. male. HPI Comments: Wesley Garcia is a 86 y.o. male with a  History of atrial fibrillation on amiodarone and xarelto, DM, CKD who presents to the Emergency Department via PTAR from the a-fib clinic due to atrial fibrillation with RVR and hypotension. Per notes, pt was hypotensive at 85/71 upon arrival. He was able to stand and transfer to the bed with assistance. He is a poor historian and is hard of hearing. He states he is experiencing central and right anterior chest pain with deep breathing. He also complains of pitting edema in the BLEs as well as persistent cough and weakness. He denies fevers, chills. Per records, he has been cardioverted multiple times in the past with his most recent being 05/13/2019.  Most recent EF on echo was 20%. Patient believes he has been taking all of his regular medications but cannot confirm since his wife manages this for him.  HPI     Past Medical History:  Diagnosis Date  . Chronic kidney disease (CKD), stage III (moderate)   . Diabetes mellitus   . Diverticulosis   . Hard of hearing   . Hemorrhoids   . Hx of adenomatous colonic polyps 2006  . Hypertension   . Squamous cell carcinoma of hard palate [facility]) 2003    Patient Active Problem List   Diagnosis Date Noted  . Secondary hypercoagulable state ([place]) 03/25/2019  . Pressure injury of sacral region, stage 2 ([place]) 03/14/2019  . Acute prostatitis 03/14/2019  . S/P right hip fracture 03/14/2019  . Irregular heart beat 03/14/2019  . Persistent atrial fibrillation ([place]) 03/14/2019  . Depression, major, single episode, mild ([place]) 05/02/2018  . Balance problem 12/24/2017  . Falls frequently 12/24/2017  . Aortic atherosclerosis ([place]) 01/01/2017  . Chronic kidney disease, stage 4  (severe) ([place]) 08/14/2016  . Exudative age-related macular degeneration of left eye ([place]) 08/14/2016  . Tachycardia-bradycardia syndrome ([place]) 08/14/2016  . Localized osteoarthritis of left knee 08/14/2016  . Benign essential hypertension 08/14/2016  . DM (diabetes mellitus) type II controlled with renal manifestation ([place]) 04/17/2014  . Hyperlipidemia associated with type 2 diabetes mellitus ([place]) 06/10/2012  . Hypertensive kidney disease, benign(403.1) 06/10/2012  . Personal history of colonic adenomas 09/29/2004    Past Surgical History:  Procedure Laterality Date  . CARDIOVERSION N/A 04/07/2019   Procedure: CARDIOVERSION;  Surgeon: Josue Hector, MD;  Location: [facility] ENDOSCOPY;  Service: Cardiovascular;  Laterality: N/A;  . CARDIOVERSION N/A 05/13/2019   Procedure: CARDIOVERSION;  Surgeon: Skeet Latch, MD;  Location: [place];  Service: Cardiovascular;  Laterality: N/A;  . CHOLECYSTECTOMY N/A 04/17/2014   Procedure: LAPAROSCOPIC CHOLECYSTECTOMY WITH INTRAOPERATIVE CHOLANGIOGRAM;  Surgeon: Erroll Luna, MD;  Location: [place];  Service: General;  Laterality: N/A;  . COLONOSCOPY  11/02/2007  . MAXILLECTOMY Right 2003   SCCA alveolar ridge       Family History  Problem Relation Age of Onset  . Cancer Mother        ?  Marland Kitchen Bone cancer Sister   . Diabetes Brother   . Kidney disease Sister   . Colon cancer Neg Hx     Social History   Tobacco Use  . Smoking status: Former Smoker    Types: Cigars    Quit date: 04/07/2000  Years since quitting: 19.1  . Smokeless tobacco: Never Used  Substance Use Topics  . Alcohol use: Not Currently    Comment: socially  . Drug use: No    Home Medications Prior to Admission medications   Medication Sig Start Date End Date Taking? Authorizing Provider  ACCU-CHEK SOFTCLIX LANCETS lancets 100 each by Other route 3 (three) times daily. Use as instructed Dx:E11.22 10/16/17   Reed, Tiffany L, DO  acetaminophen (TYLENOL) 500 MG tablet Take  1,000 mg by mouth every 6 (six) hours as needed for moderate pain.     [provider]  amiodarone (PACERONE) 200 MG tablet Take 200 mg by mouth daily.    [provider]  Blood Glucose Monitoring Suppl (ACCU-CHEK AVIVA PLUS) w/Device KIT 1 Units by Does not apply route as directed. Use as directed to check blood sugar. Dx:E11.22 10/16/17   Reed, Tiffany L, DO  EASY COMFORT PEN NEEDLES 31G X 5 MM MISC use as directed TO INJECT INSULIN TO CONTROL BLOOD SUGAR 08/31/14   [provider]  furosemide (LASIX) 20 MG tablet Take 1 tablet by mouth daily as needed for weight gain/swelling 05/06/19   Fenton, Clint R, PA  glucose blood (ACCU-CHEK AVIVA) test strip Use to test blood sugar three times daily. Dx:E11.22 10/10/18   Reed, Tiffany L, DO  insulin glargine, 2 Unit Dial, (TOUJEO MAX SOLOSTAR) 300 UNIT/ML Solostar Pen Inject 15 Units into the skin every morning.    [provider]  linagliptin (TRADJENTA) 5 MG TABS tablet Take 1 tablet (5 mg total) by mouth daily. 01/23/19   Reed, Tiffany L, DO  metoprolol tartrate (LOPRESSOR) 25 MG tablet Taking 1/2 tablet by mouth twice daily 05/20/19   Fenton, Cameron R, PA  Multiple Vitamins-Minerals (OCUVITE ADULT 50+) CAPS Take 1 capsule by mouth daily.    [provider]  pantoprazole (PROTONIX) 40 MG tablet TAKE 1 TABLET (40 MG TOTAL) BY MOUTH 2 (TWO) TIMES DAILY BEFORE A MEAL. 02/12/19   Reed, Tiffany L, DO  Rivaroxaban (XARELTO) 15 MG TABS tablet Take 1 tablet (15 mg total) by mouth daily. 03/14/19   Reed, Tiffany L, DO  tamsulosin (FLOMAX) 0.4 MG CAPS capsule Take 1 capsule (0.4 mg total) by mouth at bedtime. 03/10/19   Reed, Tiffany L, DO    Allergies    Patient has no known allergies.  Review of Systems   Review of Systems  Constitutional: Positive for activity change and fatigue. Negative for chills and fever.  HENT: Negative for congestion.   Respiratory: Positive for cough and shortness of breath (Orthopnea).     Cardiovascular: Positive for chest pain and leg swelling.  Gastrointestinal: Negative for abdominal pain, diarrhea, nausea and vomiting.  Genitourinary: Positive for difficulty urinating (Chronic). Negative for dysuria.  Neurological: Positive for dizziness and light-headedness. Negative for syncope.  All other systems reviewed and are negative.   Physical Exam Updated Vital Signs BP (!) 85/71 (BP Location: Right Arm)   Pulse 81   Temp 97.6 F (36.4 C) (Oral)   Resp 18   SpO2 90%   Physical Exam Vitals and nursing note reviewed.  Constitutional:      General: He is not in acute distress.    Appearance: Normal appearance. He is normal weight. He is not ill-appearing, toxic-appearing or diaphoretic.     Comments: Well-developed elderly Caucasian male.  He is hard of hearing.  He is a poor historian.  Questions often need to be repeated to elicit answers.  HENT:  Head: Normocephalic and atraumatic.     Right Ear: External ear normal.     Left Ear: External ear normal.     Nose: Nose normal.     Mouth/Throat:     Mouth: Mucous membranes are moist.     Pharynx: Oropharynx is clear. No oropharyngeal exudate or posterior oropharyngeal erythema.  Eyes:     General: No scleral icterus.       Right eye: No discharge.        Left eye: No discharge.     Extraocular Movements: Extraocular movements intact.     Pupils: Pupils are equal, round, and reactive to light.  Cardiovascular:     Rate and Rhythm: Tachycardia present. Rhythm irregular.     Pulses: Normal pulses.     Heart sounds: Normal heart sounds.     Comments: 2+ pitting edema noted in the BLEs.  Pulmonary:     Effort: Pulmonary effort is normal. No respiratory distress.     Breath sounds: Normal breath sounds. No stridor. No wheezing, rhonchi or rales.  Abdominal:     General: Abdomen is flat.     Palpations: Abdomen is soft.     Tenderness: There is no abdominal tenderness.  Musculoskeletal:     Cervical back:  Normal range of motion.  Skin:    Comments: Friable skin noted across the upper extremities with diffuse ecchymosis. Bandage on the left forearm from prior skin tear.   Neurological:     Mental Status: He is alert.    ED Results / Procedures / Treatments   Labs (all labs ordered are listed, but only abnormal results are displayed) Labs Reviewed  BASIC METABOLIC PANEL - Abnormal; Notable for the following components:      Result Value   Potassium 3.4 (*)    Glucose, Bld 118 (*)    BUN 25 (*)    Creatinine, Ser 1.86 (*)    Calcium 8.6 (*)    GFR calc non Af Amer 32 (*)    GFR calc Af Amer 37 (*)    All other components within normal limits  TROPONIN I (HIGH SENSITIVITY) - Abnormal; Notable for the following components:   Troponin I (High Sensitivity) 36 (*)    All other components within normal limits  SARS CORONAVIRUS 2 (TAT 6-24 HRS)  CULTURE, BLOOD (ROUTINE X 2)  CULTURE, BLOOD (ROUTINE X 2)  URINE CULTURE  CBC  BRAIN NATRIURETIC PEPTIDE  LACTIC ACID, PLASMA  URINALYSIS, ROUTINE W REFLEX MICROSCOPIC  MAGNESIUM  HEPATIC FUNCTION PANEL  TSH  BASIC METABOLIC PANEL  TROPONIN I (HIGH SENSITIVITY)  TROPONIN I (HIGH SENSITIVITY)   EKG EKG Interpretation  Date/Time:  Tuesday May 20 2019 15:37:41 EDT Ventricular Rate:  117 PR Interval:    QRS Duration: 70 QT Interval:  366 QTC Calculation: 510 R Axis:   41 Text Interpretation: Atrial fibrillation with rapid ventricular response with premature ventricular or aberrantly conducted complexes Anterior infarct , age undetermined ST & T wave abnormality, consider inferior ischemia Abnormal ECG 05/13/19 - was in sinus rhythm with some bradycardia Confirmed by Noemi Chapel [phone]) on 05/20/2019 4:31:18 PM  Radiology DG Chest Port 1 View  Result Date: 05/20/2019 CLINICAL DATA:  Chest pain shortness of breath. EXAM: PORTABLE CHEST 1 VIEW COMPARISON:  04/16/2014 FINDINGS: There are moderate-sized bilateral pleural effusions with  adjacent airspace opacities favored to represent compressive atelectasis. There is no convincing pneumothorax. There is a lucency along the left heart border which is felt to represent  artifact. There is no acute osseous abnormality. The heart size appears borderline enlarged but is difficult to fully evaluate. Aortic calcifications are noted. IMPRESSION: Moderate-sized bilateral pleural effusions with adjacent airspace opacities favored to represent compressive atelectasis. Electronically Signed   By: Constance Holster M.D.   On: 05/20/2019 16:14   Procedures Procedures (including critical care time)  Medications Ordered in ED Medications  sodium chloride flush (NS) 0.9 % injection 3 mL (has no administration in time range)  nitroGLYCERIN (NITROSTAT) SL tablet 0.4 mg (has no administration in time range)  acetaminophen (TYLENOL) tablet 650 mg (has no administration in time range)  ondansetron (ZOFRAN) injection 4 mg (has no administration in time range)  zolpidem (AMBIEN) tablet 5 mg (has no administration in time range)  sodium chloride flush (NS) 0.9 % injection 3 mL (has no administration in time range)  sodium chloride flush (NS) 0.9 % injection 3 mL (has no administration in time range)  0.9 %  sodium chloride infusion (has no administration in time range)  ALPRAZolam (XANAX) tablet 0.25 mg (has no administration in time range)  insulin aspart (novoLOG) injection 0-15 Units (has no administration in time range)  potassium chloride SA (KLOR-CON) CR tablet 40 mEq (has no administration in time range)  insulin glargine (2 Unit Dial) (TOUJEO MAX) Solostar Pen SOPN 15 Units (has no administration in time range)  linagliptin (TRADJENTA) tablet 5 mg (has no administration in time range)  metoprolol tartrate (LOPRESSOR) tablet 25 mg (has no administration in time range)  Ocuvite Adult 50+ CAPS 1 capsule (has no administration in time range)  pantoprazole (PROTONIX) EC tablet 40 mg (has no  administration in time range)  Rivaroxaban (XARELTO) tablet 15 mg (has no administration in time range)  tamsulosin (FLOMAX) capsule 0.4 mg (has no administration in time range)  sodium chloride 0.9 % bolus 500 mL (0 mLs Intravenous Stopped 05/20/19 1801)    ED Course  I have reviewed the triage vital signs and the nursing notes.  Pertinent labs & imaging results that were available during my care of the patient were reviewed by me and considered in my medical decision making (see chart for details).    MDM Rules/Calculators/A&P                      4:24 PM patient is a 86 year old Caucasian male who presents with hypotension, atrial fibrillation with RVR, orthopnea, leg swelling, fatigue.  He was at the atrial fibrillation clinic and was sent over due to his current blood pressure.  BNP, troponin, BMP, lactic acid, urine/culture, blood cultures are in process.  CBC shows no leukocytosis.  Initial ECG confirms atrial fibrillation with RVR.  Chest x-ray shows moderate sized bilateral pleural effusions with adjacent airspace opacities favored to represent compressive atelectasis.  This patient was discussed with and evaluated by my attending physician Dr. Noemi Chapel.  We will discuss with cardiology and patient will likely be admitted.  We will give 500 mL fluid bolus for his pressure.  We will closely monitor and reevaluate.  4:28 PM Dr. Sabra Heck discussed with cardiology who will evaluate the patient.   6:03 PM Cardiology evaluated pt and he will be admitted.  He is still irregular and mildly tachycardic.  He is no longer hypotensive.  Patient understands he is going to be admitted.  He has no questions at this time.  Final Clinical Impression(s) / ED Diagnoses Final diagnoses:  Atrial fibrillation with RVR (HCC)  Acute on chronic congestive heart  failure, unspecified heart failure type [name])   Rx / DC Orders ED Discharge Orders    None       Rayna Sexton, PA-C 05/20/19 1829      Noemi Chapel, MD 05/22/19 [phone]

## 2019-05-21 ENCOUNTER — Other Ambulatory Visit (HOSPITAL_COMMUNITY): Payer: Self-pay

## 2019-05-21 ENCOUNTER — Inpatient Hospital Stay (HOSPITAL_COMMUNITY)
Admission: EM | Disposition: A | Discharge status: Home / Self Care | Payer: Self-pay | Source: Home / Self Care | Attending: Interventional Cardiology

## 2019-05-21 DIAGNOSIS — I495 Sick sinus syndrome: Secondary | ICD-10-CM

## 2019-05-21 DIAGNOSIS — Z950 Presence of cardiac pacemaker: Secondary | ICD-10-CM

## 2019-05-21 HISTORY — PX: PACEMAKER IMPLANT: EP1218

## 2019-05-21 HISTORY — DX: Presence of cardiac pacemaker: Z95.0

## 2019-05-21 LAB — BASIC METABOLIC PANEL
Anion gap: 11 (ref 5–15)
BUN: 23 mg/dL (ref 8–23)
CO2: 29 mmol/L (ref 22–32)
Calcium: 8.4 mg/dL — ABNORMAL LOW (ref 8.9–10.3)
Chloride: 100 mmol/L (ref 98–111)
Creatinine, Ser: 1.91 mg/dL — ABNORMAL HIGH (ref 0.61–1.24)
GFR calc Af Amer: 36 mL/min — ABNORMAL LOW (ref >60)
GFR calc non Af Amer: 31 mL/min — ABNORMAL LOW (ref >60)
Glucose, Bld: 43 mg/dL — CL (ref 70–99)
Potassium: 3.4 mmol/L — ABNORMAL LOW (ref 3.5–5.1)
Sodium: 140 mmol/L (ref 135–145)

## 2019-05-21 LAB — SURGICAL PCR SCREEN
MRSA, PCR: NEGATIVE
Staphylococcus aureus: POSITIVE — AB

## 2019-05-21 LAB — GLUCOSE, CAPILLARY
Glucose-Capillary: 132 mg/dL — ABNORMAL HIGH (ref 70–99)
Glucose-Capillary: 117 mg/dL — ABNORMAL HIGH (ref 70–99)
Glucose-Capillary: 55 mg/dL — ABNORMAL LOW (ref 70–99)
Glucose-Capillary: 131 mg/dL — ABNORMAL HIGH (ref 70–99)
Glucose-Capillary: 68 mg/dL — ABNORMAL LOW (ref 70–99)
Glucose-Capillary: 95 mg/dL (ref 70–99)
Glucose-Capillary: 189 mg/dL — ABNORMAL HIGH (ref 70–99)

## 2019-05-21 LAB — URINE CULTURE: Culture: NO GROWTH

## 2019-05-21 LAB — MAGNESIUM: Magnesium: 1.9 mg/dL (ref 1.7–2.4)

## 2019-05-21 LAB — LACTIC ACID, PLASMA: Lactic Acid, Venous: 1.8 mmol/L (ref 0.5–1.9)

## 2019-05-21 SURGERY — PACEMAKER IMPLANT

## 2019-05-21 MED ORDER — FUROSEMIDE 10 MG/ML IJ SOLN: 40.0000 mg | Freq: Once | INTRAMUSCULAR | Status: DC

## 2019-05-21 MED ORDER — SODIUM CHLORIDE 0.9 % IV SOLN
80.0000 mg | INTRAVENOUS | Status: AC
  Administered 2019-05-21: 80 mg
  Filled 2019-05-21: qty 2

## 2019-05-21 MED ORDER — ACETAMINOPHEN 325 MG PO TABS
325.0000 mg | ORAL_TABLET | ORAL | Status: DC | PRN
  Administered 2019-05-23 – 2019-05-24 (×2): 650 mg via ORAL
  Filled 2019-05-21 (×2): qty 2

## 2019-05-21 MED ORDER — ONDANSETRON HCL 4 MG/2ML IJ SOLN
4.0000 mg | Freq: Four times a day (QID) | INTRAMUSCULAR | Status: DC | PRN
  Administered 2019-05-26: 4 mg via INTRAVENOUS

## 2019-05-21 MED ORDER — CEFAZOLIN SODIUM-DEXTROSE 1-4 GM/50ML-% IV SOLN
1.0000 g | Freq: Two times a day (BID) | INTRAVENOUS | Status: AC
  Administered 2019-05-22 (×2): 1 g via INTRAVENOUS
  Filled 2019-05-21 (×3): qty 50

## 2019-05-21 MED ORDER — FENTANYL CITRATE (PF) 100 MCG/2ML IJ SOLN
INTRAMUSCULAR | Status: DC | PRN
  Administered 2019-05-21: 25 ug via INTRAVENOUS

## 2019-05-21 MED ORDER — POTASSIUM CHLORIDE CRYS ER 20 MEQ PO TBCR
40.0000 meq | EXTENDED_RELEASE_TABLET | Freq: Once | ORAL | Status: AC
  Administered 2019-05-21: 40 meq via ORAL
  Filled 2019-05-21: qty 2

## 2019-05-21 MED ORDER — HEPARIN (PORCINE) IN NACL 1000-0.9 UT/500ML-% IV SOLN
INTRAVENOUS | Status: DC | PRN
  Administered 2019-05-21: 500 mL

## 2019-05-21 MED ORDER — IOHEXOL 350 MG/ML SOLN
INTRAVENOUS | Status: DC | PRN
  Administered 2019-05-21: 10 mL
  Administered 2019-05-21: 15 mL

## 2019-05-21 MED ORDER — FUROSEMIDE 20 MG PO TABS: ORAL_TABLET | ORAL | 1 refills | Status: DC

## 2019-05-21 MED ORDER — SODIUM CHLORIDE 0.9 % IV SOLN: INTRAVENOUS | Status: DC

## 2019-05-21 MED ORDER — SODIUM CHLORIDE 0.9% FLUSH: 3.0000 mL | INTRAVENOUS | Status: DC | PRN

## 2019-05-21 MED ORDER — INSULIN GLARGINE 100 UNIT/ML ~~LOC~~ SOLN
15.0000 [IU] | Freq: Every day | SUBCUTANEOUS | Status: DC
  Administered 2019-05-22 – 2019-05-29 (×7): 15 [IU] via SUBCUTANEOUS
  Filled 2019-05-21 (×9): qty 0.15

## 2019-05-21 MED ORDER — CHLORHEXIDINE GLUCONATE 4 % EX LIQD
CUTANEOUS | Status: AC
  Filled 2019-05-21: qty 15

## 2019-05-21 MED ORDER — LIDOCAINE HCL 1 % IJ SOLN
INTRAMUSCULAR | Status: AC
  Filled 2019-05-21: qty 60

## 2019-05-21 MED ORDER — FENTANYL CITRATE (PF) 100 MCG/2ML IJ SOLN
INTRAMUSCULAR | Status: AC
  Filled 2019-05-21: qty 2

## 2019-05-21 MED ORDER — DEXTROSE 50 % IV SOLN
12.5000 g | INTRAVENOUS | Status: AC
  Administered 2019-05-21: 12.5 g via INTRAVENOUS
  Filled 2019-05-21: qty 50

## 2019-05-21 MED ORDER — HEPARIN (PORCINE) IN NACL 1000-0.9 UT/500ML-% IV SOLN
INTRAVENOUS | Status: AC
  Filled 2019-05-21: qty 500

## 2019-05-21 MED ORDER — METOPROLOL TARTRATE 25 MG PO TABS: 12.5000 mg | ORAL_TABLET | Freq: Two times a day (BID) | ORAL | 1 refills | Status: DC

## 2019-05-21 MED ORDER — SODIUM CHLORIDE 0.9 % IV SOLN: 250.0000 mL | INTRAVENOUS | Status: DC

## 2019-05-21 MED ORDER — CEFAZOLIN SODIUM-DEXTROSE 2-4 GM/100ML-% IV SOLN
INTRAVENOUS | Status: AC
  Filled 2019-05-21: qty 100

## 2019-05-21 MED ORDER — DEXTROSE 50 % IV SOLN: 25.0000 g | INTRAVENOUS | Status: AC

## 2019-05-21 MED ORDER — PROSIGHT PO TABS
1.0000 | ORAL_TABLET | Freq: Every day | ORAL | Status: DC
  Administered 2019-05-21 – 2019-05-29 (×9): 1 via ORAL
  Filled 2019-05-21 (×9): qty 1

## 2019-05-21 MED ORDER — SODIUM CHLORIDE 0.9 % IV SOLN
INTRAVENOUS | Status: AC
  Filled 2019-05-21: qty 2

## 2019-05-21 MED ORDER — CHLORHEXIDINE GLUCONATE 4 % EX LIQD
60.0000 mL | Freq: Once | CUTANEOUS | Status: AC
  Administered 2019-05-21: 4 via TOPICAL
  Filled 2019-05-21: qty 15

## 2019-05-21 MED ORDER — CEFAZOLIN SODIUM-DEXTROSE 2-4 GM/100ML-% IV SOLN
2.0000 g | INTRAVENOUS | Status: AC
  Administered 2019-05-21: 2 g via INTRAVENOUS
  Filled 2019-05-21: qty 100

## 2019-05-21 MED ORDER — DEXTROSE 50 % IV SOLN
INTRAVENOUS | Status: AC
  Administered 2019-05-21: 25 g via INTRAVENOUS
  Filled 2019-05-21: qty 50

## 2019-05-21 MED ORDER — CHLORHEXIDINE GLUCONATE 4 % EX LIQD
60.0000 mL | Freq: Once | CUTANEOUS | Status: DC
  Filled 2019-05-21: qty 45

## 2019-05-21 MED ORDER — LIDOCAINE HCL (PF) 1 % IJ SOLN
INTRAMUSCULAR | Status: DC | PRN
  Administered 2019-05-21: 60 mL

## 2019-05-21 SURGICAL SUPPLY — 17 items
BALLN COR SINUS VENO 6FR 80 (BALLOONS) ×2
BALLOON COR SINUS VENO 6FR 80 (BALLOONS) IMPLANT
CABLE SURGICAL S-101-97-12 (CABLE) ×2 IMPLANT
CATH CPS DIRECT 135 DS2C020 (CATHETERS) ×1 IMPLANT
CATH CPS QUART CN DS2N029-65 (CATHETERS) ×1 IMPLANT
CPS IMPLANT KIT 410190 (MISCELLANEOUS) ×1 IMPLANT
KIT ESSENTIALS PG (KITS) ×1 IMPLANT
LEAD TENDRIL MRI 52CM LPA1200M (Lead) ×1 IMPLANT
LEAD TENDRIL MRI 58CM LPA1200M (Lead) ×1 IMPLANT
PACEMAKER ASSURITY DR-RF (Pacemaker) ×1 IMPLANT
PAD PRO RADIOLUCENT 2001M-C (PAD) ×2 IMPLANT
SHEATH 8FR PRELUDE SNAP 13 (SHEATH) ×1 IMPLANT
TRAY PACEMAKER INSERTION (PACKS) ×2 IMPLANT
WIRE ACUITY WHISPER EDS 4648 (WIRE) ×2 IMPLANT
WIRE HI TORQ VERSACORE-J 145CM (WIRE) ×1 IMPLANT
WIRE HI TORQ WHISPER MS 190CM (WIRE) ×1 IMPLANT
WIRE RUNTHROUGH .014X180CM (WIRE) ×1 IMPLANT

## 2019-05-21 NOTE — H&P (Signed)
Wesley Garcia has presented today for surgery, with the diagnosis of tachy/brady syndrome.  The various methods of treatment have been discussed with the patient and family. After consideration of risks, benefits and other options for treatment, the patient has consented to  Procedure(s): Pacemaker implant as a surgical intervention .  Risks include but not limited to bleeding, tamponade, infection, pneumothorax, among others. The patient's history has been reviewed, patient examined, no change in status, stable for surgery.  I have reviewed the patient's chart and labs.  Questions were answered to the patient's satisfaction.    Venkat Ankney Curt Bears, MD 05/21/2019 12:19 PM

## 2019-05-21 NOTE — Progress Notes (Signed)
PHARMACY NOTE:  ANTIMICROBIAL RENAL DOSAGE ADJUSTMENT  Current antimicrobial regimen includes a mismatch between antimicrobial dosage and estimated renal function.  As per policy approved by the Pharmacy & Therapeutics and Medical Executive Committees, the antimicrobial dosage will be adjusted accordingly.  Current antimicrobial dosage:  Cefazolin 1g Q6h  Indication: post-procedure prophylaxis   Renal Function:  Estimated Creatinine Clearance: 24.6 mL/min (A) (by C-G formula based on SCr of 1.91 mg/dL (H)).     Antimicrobial dosage has been changed to:  Cefazplin 1g Q12hr  Benetta Spar, PharmD, BCPS, [facility] Clinical Pharmacist  Please check AMION for all [place] phone numbers After 10:00 PM, call [place] [phone]

## 2019-05-21 NOTE — Progress Notes (Signed)
MD Communication ( cardiology)-on call cardiologist notified that Amiodarone drip has been stopped second to sinus brady 44

## 2019-05-21 NOTE — Plan of Care (Signed)

## 2019-05-21 NOTE — Progress Notes (Addendum)
Late Entry for 03.17.21 @ 2121: Patient seen and assessed. Patient found resting in bed. Left anterior chest wall dressing with moderate amount of bloody drainage noted. Physical assessment complete via computerized charting per [facility] policy. Right forearm gauze dressing found in patient's hands. New dressing replaced. IV sites to right AC and forearm flushed with 10 ml normal saline. Side rails up x 4. Bed in lowest position and locked. Call light in reach. No family present at bedside.   MD Communication noted 03.17.21 @ 2339: On call provider notified of initial dressing to left anterior chest wall reinforced second to bleeding to initial dressing. On call provider Vickki Muff) returns page. Update given. In agreement with reinforcing dressing.

## 2019-05-21 NOTE — Progress Notes (Addendum)
New patient admission. Patient received from [facility] ED via stretcher to room 601. Admission vital signs obtained including height and weight. Skin assessment completed with charge RN, Neoma Laming. Small, pinpoint wound noted to gluteal fold. Placed on telemetry with sinus brady 44 noted. Amiodarone drip previously infusing at 16 ml/hr stopped. No family in accompany. Side rails up times 2. Bed in lowest position and locked. Call light in reach.    MD Communication 03.17.21 @ 0236-On call provider notified that patient Amiodarone drip has stopped second to HR sinus brady

## 2019-05-21 NOTE — Progress Notes (Signed)
Hypoglycemic Policy Follow Up: FSBS obtain after Dextrose 50% 25 gm given IV. FSBS result was 132. Will report off to day shift staff nurse

## 2019-05-21 NOTE — Progress Notes (Addendum)
Progress Note  Patient Name: Wesley Garcia Date of Encounter: 05/21/2019  Primary Cardiologist: Larae Grooms, MD   Subjective   Patient feels much better this morning.  Not SOB, no CP  Inpatient Medications    Scheduled Meds:  chlorhexidine  60 mL Topical Once   gentamicin irrigation  80 mg Irrigation On Call   insulin aspart  0-15 Units Subcutaneous TID WC   [START ON 05/22/2019] insulin glargine  15 Units Subcutaneous Daily   linagliptin  5 mg Oral Daily   metoprolol tartrate  12.5 mg Oral BID   multivitamin  1 tablet Oral Daily   pantoprazole  40 mg Oral BID AC   Rivaroxaban  15 mg Oral QPM   sodium chloride flush  3 mL Intravenous Q12H   sodium chloride flush  3 mL Intravenous Q12H   tamsulosin  0.4 mg Oral QHS   Continuous Infusions:  sodium chloride     sodium chloride 50 mL/hr at 05/21/19 0700   sodium chloride     amiodarone 30 mg/hr (05/21/19 0027)    ceFAZolin (ANCEF) IV     PRN Meds: sodium chloride, acetaminophen, ALPRAZolam, nitroGLYCERIN, ondansetron (ZOFRAN) IV, sodium chloride flush, sodium chloride flush, zolpidem   Vital Signs    Vitals:   05/21/19 0210 05/21/19 0410 05/21/19 0819 05/21/19 0820  BP: (!) 106/53 122/63 (!) 166/73 (!) 166/73  Pulse: (!) 44 (!) 48 (!) 59 (!) 58  Resp: (!) 23     Temp: 98.1 F (36.7 C)     TempSrc: Oral     SpO2: 98% 98%  98%  Weight: 67.9 kg       Intake/Output Summary (Last 24 hours) at 05/21/2019 0923 Last data filed at 05/21/2019 0700 Gross per 24 hour  Intake 80.73 ml  Output 1350 ml  Net -1269.27 ml   Last 3 Weights 05/21/2019 05/20/2019 05/13/2019  Weight (lbs) 149 lb 11.1 oz 155 lb 155 lb  Weight (kg) 67.9 kg 70.308 kg 70.308 kg      Telemetry    SB high 40's overnight high 50's awake - Personally Reviewed  ECG    No new EKGs - Personally Reviewed  Physical Exam   GEN: No acute distress.   Neck: No JVD Cardiac: RRR, bradycardic, no murmurs, rubs, or gallops.  Respiratory: diminished att  he bases GI: Soft, nontender, non-distended  MS: 2+ edema; No deformity. Neuro:  Nonfocal  Psych: Normal affect   Labs    High Sensitivity Troponin:   Recent Labs  Lab 05/20/19 1552 05/20/19 1955  TROPONINIHS 36* 34*      Chemistry Recent Labs  Lab 05/20/19 1552 05/20/19 1955 05/21/19 0335  NA 139  --  140  K 3.4*  --  3.4*  CL 101  --  100  CO2 26  --  29  GLUCOSE 118*  --  43*  BUN 25*  --  23  CREATININE 1.86*  --  1.91*  CALCIUM 8.6*  --  8.4*  PROT  --  6.0*  --   ALBUMIN  --  2.8*  --   AST  --  23  --   ALT  --  14  --   ALKPHOS  --  74  --   BILITOT  --  0.9  --   GFRNONAA 32*  --  31*  GFRAA 37*  --  36*  ANIONGAP 12  --  11     Hematology Recent Labs  Lab 05/20/19 1552  WBC 8.1  RBC 4.97  HGB 14.4  HCT 46.0  MCV 92.6  MCH 29.0  MCHC 31.3  RDW 14.8  PLT 253    BNP Recent Labs  Lab 05/20/19 1955  BNP 1,671.4*     DDimer No results for input(s): DDIMER in the last 168 hours.   Radiology    DG Chest 2 View Result Date: 05/20/2019 CLINICAL DATA:  Short of breath. History of congestive heart failure. EXAM: CHEST - 2 VIEW COMPARISON:  Earlier same day FINDINGS: No discernible change. Bilateral pleural effusions with atelectasis in the lower lungs. Venous hypertension with mild interstitial edema. Upper lungs are reasonably well aerated. IMPRESSION: No change since earlier. Congestive heart failure. Bilateral effusions with lower lung atelectasis. Pulmonary venous hypertension with mild interstitial edema. Electronically Signed   By: Nelson Chimes M.D.   On: 05/20/2019 18:54     Cardiac Studies   ECHO: 04/21/2019  1. Septal apical distal anterior wall mid and apical inferior wall  hypokinesis Basal function preserved Abnormal GLS -6. Left ventricular  ejection fraction, by estimation, is 25%. The left ventricle has severely  decreased function. The left ventricle  demonstrates regional wall motion abnormalities (see scoring    diagram/findings for description). The left ventricular internal cavity  size was moderately to severely dilated. Left ventricular diastolic  parameters are indeterminate.   2. Right ventricular systolic function is normal. The right ventricular  size is normal. There is moderately elevated pulmonary artery systolic  pressure.   3. Left atrial size was mildly dilated.   4. The mitral valve is normal in structure and function. Mild mitral  valve regurgitation. No evidence of mitral stenosis.   5. The aortic valve is tricuspid. Aortic valve regurgitation is trivial.  Mild aortic valve sclerosis is present, with no evidence of aortic valve  stenosis.   6. The inferior vena cava is dilated in size with >50% respiratory  variability, suggesting right atrial pressure of 8 mmHg.   Patient Profile     86 y.o. male with a history of CKD IV, DM, HTN, squamous cell carcinoma of hard palate, fall with hip fracture s/p surgical repair 02/27/19, and persistent atrial fibrillation dx 03/14/2019, on Xarelto for CHA2DS2-VASc of 4, s/p DCCV 04/07/2019, recurrent A. fib and started on amiodarone 04/14/2019, repeat cardioversion on 05/12/2019.  He arrived to the Afib clinic yesterday with ERAF, rates 100's-110's and hypotensive with SBP 80's and referredto the ER. Initial plan was to pace and ablate  He is quite symptomatic, weak, SOB an on occassion associated with CP  Assessment & Plan    1.  Persistent atrial fibrillation, RVR      He has been on amiodarone since 04/14/2019, but only stayed in sinus rhythm a couple of days after his recent cardioversion       He has had conversion to SB high 40's PM 50's-60 this AM awake  Given this, rhythm control may still be on the table may be early to ablate his AVNode,   Dr. Curt Bears has seen and examined the patient Discussed change in strategy today with plans for CRT-pacer and no ablation but Wesley Garcia maximize his medicines post pacing He is agreeable    2.  Pleural  effusions -Wesley Garcia order 2 view chest x-ray to further evaluate.   -The left side may be worse than the right, we may need a decubitus film to see if he might benefit from thoracentesis   3.  Diabetes:      Continue  home medications with daytime only sliding scale insulin   4.  Hypertension      Relative hypotension is much better   5.  Acute on chronic systolic CHF      Fluid neg -1257ml since his lasix dose      He is feeling much better      Wesley Garcia plan to give more after his procedure, he remains volume OL   6. NSVT - Pt had frequent short runs of NSVT in the ER   Wesley Garcia further supplement his K+, though none noted when in SR       For questions or updates, please contact [place] Please consult [email or web address] for contact info under        Signed, Baldwin Jamaica, PA-C  05/21/2019, 9:23 AM    I have seen and examined this patient with Wesley Garcia.  Agree with above, note added to reflect my findings.  On exam, RRR, no murmurs, lungs clear.  Patient is fortunately converted to sinus rhythm, though he is certainly bradycardic.  It is certainly possible that pacemaker implant would help to control his atrial fibrillation.  He does have chronic systolic heart failure.  We Feleica Fulmore attempt CRT-P implant.  He did have some chest pain this morning.  ECG shows a prolonged QTC.  We Takima Encina recheck his ECG now that he is off of IV amiodarone to ensure he Carnell Beavers not have further issues.  Violanda Bobeck M. Taijuan Serviss MD 05/21/2019 12:17 PM

## 2019-05-21 NOTE — Progress Notes (Signed)
Pt reporting 5/10 aching mid to left sided chest pain and SOB. VSS. PRN med given and 1L of 02 placed for comfort. PA-C notified, EKG obtained. Will continue to monitor.

## 2019-05-21 NOTE — Progress Notes (Signed)
Hypoglycemic Event  CBG: 43  Treatment: Dextrose 50 % 25 gm IV   Symptoms: none   Follow-up CBG: Time:0706 CBG Result:132  Possible Reasons for Event: unknown; patient last ate graham crackers in ED last PM/early AM. Exact last PO intake time unknown.   Comments/MD notified:no     [employer]

## 2019-05-21 NOTE — Progress Notes (Signed)
Patient seen, daughter in law at bedside. He is sleeping, arouses well He feels fine except hungry He is very hard of hearing, when he hears me, he is appropriate Remains in SR 47-50bpm  EKG  SB 47, no ischemic looking changes  QT is long 668ms, QTc 552  C/o CP resolved after  Tylenol  He is already off amio gtt and K+ supplemented, mag this AM 1.9  Will need to revisit our AAD perhaps His last SR EKG on 05/13/19 QT was about 500 with HR 43 at that time, QTc 424  BS is low again 55, dextrose ordered  Telemetry, SB 47-51bpm, BP stable  Tommye Standard, PA-C

## 2019-05-22 ENCOUNTER — Inpatient Hospital Stay (HOSPITAL_COMMUNITY): Payer: Medicare PPO

## 2019-05-22 DIAGNOSIS — J90 Pleural effusion, not elsewhere classified: Secondary | ICD-10-CM

## 2019-05-22 LAB — GLUCOSE, CAPILLARY
Glucose-Capillary: 102 mg/dL — ABNORMAL HIGH (ref 70–99)
Glucose-Capillary: 174 mg/dL — ABNORMAL HIGH (ref 70–99)
Glucose-Capillary: 105 mg/dL — ABNORMAL HIGH (ref 70–99)
Glucose-Capillary: 140 mg/dL — ABNORMAL HIGH (ref 70–99)

## 2019-05-22 LAB — BASIC METABOLIC PANEL
Anion gap: 11 (ref 5–15)
BUN: 21 mg/dL (ref 8–23)
CO2: 26 mmol/L (ref 22–32)
Calcium: 8.3 mg/dL — ABNORMAL LOW (ref 8.9–10.3)
Chloride: 102 mmol/L (ref 98–111)
Creatinine, Ser: 1.98 mg/dL — ABNORMAL HIGH (ref 0.61–1.24)
GFR calc Af Amer: 34 mL/min — ABNORMAL LOW (ref >60)
GFR calc non Af Amer: 29 mL/min — ABNORMAL LOW (ref >60)
Glucose, Bld: 138 mg/dL — ABNORMAL HIGH (ref 70–99)
Potassium: 4.1 mmol/L (ref 3.5–5.1)
Sodium: 139 mmol/L (ref 135–145)

## 2019-05-22 MED ORDER — METOPROLOL TARTRATE 25 MG PO TABS
25.0000 mg | ORAL_TABLET | Freq: Three times a day (TID) | ORAL | Status: DC
  Administered 2019-05-22 – 2019-05-23 (×3): 25 mg via ORAL
  Filled 2019-05-22 (×4): qty 1

## 2019-05-22 MED ORDER — FUROSEMIDE 10 MG/ML IJ SOLN
40.0000 mg | Freq: Once | INTRAMUSCULAR | Status: AC
  Administered 2019-05-22: 40 mg via INTRAVENOUS
  Filled 2019-05-22: qty 4

## 2019-05-22 MED ORDER — INSULIN ASPART 100 UNIT/ML ~~LOC~~ SOLN
0.0000 [IU] | Freq: Three times a day (TID) | SUBCUTANEOUS | Status: DC
  Administered 2019-05-23: 5 [IU] via SUBCUTANEOUS
  Administered 2019-05-23: 3 [IU] via SUBCUTANEOUS
  Administered 2019-05-24 (×2): 2 [IU] via SUBCUTANEOUS
  Administered 2019-05-25: 3 [IU] via SUBCUTANEOUS
  Administered 2019-05-25: 2 [IU] via SUBCUTANEOUS
  Administered 2019-05-25: 3 [IU] via SUBCUTANEOUS
  Administered 2019-05-26 (×3): 1 [IU] via SUBCUTANEOUS
  Administered 2019-05-27: 2 [IU] via SUBCUTANEOUS
  Administered 2019-05-27: 3 [IU] via SUBCUTANEOUS
  Administered 2019-05-27 – 2019-05-28 (×2): 1 [IU] via SUBCUTANEOUS
  Administered 2019-05-28 – 2019-05-29 (×2): 2 [IU] via SUBCUTANEOUS
  Administered 2019-05-29 (×2): 1 [IU] via SUBCUTANEOUS

## 2019-05-22 MED ORDER — AMIODARONE HCL 200 MG PO TABS
200.0000 mg | ORAL_TABLET | Freq: Every day | ORAL | Status: DC
  Administered 2019-05-22 – 2019-05-24 (×3): 200 mg via ORAL
  Filled 2019-05-22 (×3): qty 1

## 2019-05-22 MED FILL — Lidocaine HCl Local Inj 1%: INTRAMUSCULAR | Qty: 60 | Status: AC

## 2019-05-22 NOTE — Progress Notes (Signed)
Results for JAYSON, WATERHOUSE (MRN [id number]) as of 05/22/2019 11:45  Ref. Range 05/21/2019 18:28 05/21/2019 18:56 05/21/2019 22:15 05/22/2019 07:51 05/22/2019 11:22  Glucose-Capillary Latest Ref Range: 70 - 99 mg/dL 68 (L) 95 189 (H) 102 (H) 174 (H)  Noted that blood sugars continue to be less than 180 mg/dl.  Recommend decreasing Novolog correction scale to SENSITIVE (0-9 units) TID if blood sugars continue to be less than 180 mg/dl.   Harvel Ricks RN BSN CDE Diabetes Coordinator Pager: [phone]  8am-5pm

## 2019-05-22 NOTE — Evaluation (Signed)
Physical Therapy Evaluation Patient Details Name: Wesley Garcia MRN: [id number] DOB: [birth year] Today's Date: 05/22/2019   History of Present Illness  Pt is an 86 y/o male admitted secondary to increased SOB and weakness. Found to have a fib and is s/p pacemaker insertion. PMH includes a fib, DM, and HTN.   Clinical Impression  Pt admitted secondary to problem above with deficits below. Pt very unsteady and with poor safety awareness despite cues to maintain precautions. Required mod to max for mobility tasks this session. Pt also impulsive and required cues from PT for safety during transfers. Feel pt will benefit from SNF level therapies at d/c. Will continue to follow acutely to maximize functional mobility independence and safety.     Follow Up Recommendations SNF;Supervision/Assistance - 24 hour    Equipment Recommendations  None recommended by PT    Recommendations for Other Services       Precautions / Restrictions Precautions Precautions: Fall;ICD/Pacemaker Precaution Comments: Pacemaker precautions Restrictions Weight Bearing Restrictions: Yes Other Position/Activity Restrictions: pacemaker precautions      Mobility  Bed Mobility Overal bed mobility: Needs Assistance Bed Mobility: Supine to Sit;Sit to Supine     Supine to sit: Max assist Sit to supine: Max assist   General bed mobility comments: Max A for LE and trunk assist. Pt with poor sitting balance requiring RUE support and at least min guard to min A for balance.   Transfers Overall transfer level: Needs assistance Equipment used: 1 person hand held assist Transfers: Sit to/from Stand Sit to Stand: Mod assist;Max assist         General transfer comment: Mod to max A for lift assist and steadying to stand. Cues to place RUE on PT arm to assist with standing. Pt kept trying to grab with LUE and required multiple cues to limit use of LUE.   Ambulation/Gait                Stairs             Wheelchair Mobility    Modified Rankin (Stroke Patients Only)       Balance Overall balance assessment: Needs assistance Sitting-balance support: Single extremity supported;Feet supported Sitting balance-Leahy Scale: Poor Sitting balance - Comments: Reliant on RUE and at least min guard to min A to maintain sitting balance.    Standing balance support: Single extremity supported Standing balance-Leahy Scale: Poor Standing balance comment: Reliant on RUE and external assist.                              Pertinent Vitals/Pain Pain Assessment: No/denies pain    Home Living Family/patient expects to be discharged to:: Private residence Living Arrangements: Spouse/significant other Available Help at Discharge: Family Type of Home: House Home Access: Stairs to enter Entrance Stairs-Rails: Right Entrance Stairs-Number of Steps: 2 Home Layout: One level Home Equipment: Environmental consultant - 2 wheels      Prior Function Level of Independence: Independent with assistive device(s)         Comments: Pt reports use of RW at home      Hand Dominance        Extremity/Trunk Assessment   Upper Extremity Assessment Upper Extremity Assessment: LUE deficits/detail LUE Deficits / Details: Very bruised at incision side. Limited ROM secondary to pacemaker precautions.     Lower Extremity Assessment Lower Extremity Assessment: Generalized weakness    Cervical / Trunk Assessment Cervical / Trunk Assessment: Kyphotic  Communication   Communication: HOH  Cognition Arousal/Alertness: Awake/alert Behavior During Therapy: Impulsive Overall Cognitive Status: No family/caregiver present to determine baseline cognitive functioning                                 General Comments: Pt with poor safety awareness and poor awareness of deficits. Kept attempting to use LUE despite cues to limit use.       General Comments      Exercises     Assessment/Plan    PT  Assessment Patient needs continued PT services  PT Problem List Decreased strength;Decreased balance;Decreased mobility;Decreased knowledge of use of DME;Decreased cognition;Decreased safety awareness;Decreased knowledge of precautions       PT Treatment Interventions Gait training;DME instruction;Functional mobility training;Therapeutic activities;Therapeutic exercise;Balance training;Patient/family education    PT Goals (Current goals can be found in the Care Plan section)  Acute Rehab PT Goals Patient Stated Goal: none stated PT Goal Formulation: With patient Time For Goal Achievement: 06/05/19 Potential to Achieve Goals: Fair    Frequency Min 2X/week   Barriers to discharge        Co-evaluation               AM-PAC PT "6 Clicks" Mobility  Outcome Measure Help needed turning from your back to your side while in a flat bed without using bedrails?: A Lot Help needed moving from lying on your back to sitting on the side of a flat bed without using bedrails?: Total Help needed moving to and from a bed to a chair (including a wheelchair)?: Total Help needed standing up from a chair using your arms (e.g., wheelchair or bedside chair)?: Total Help needed to walk in hospital room?: Total Help needed climbing 3-5 steps with a railing? : Total 6 Click Score: 7    End of Session Equipment Utilized During Treatment: Gait belt;Other (comment)(LUE sling) Activity Tolerance: Patient tolerated treatment well Patient left: in bed;with call bell/phone within reach;with bed alarm set Nurse Communication: Mobility status PT Visit Diagnosis: Unsteadiness on feet (R26.81);Muscle weakness (generalized) (M62.81);History of falling (Z91.81)    Time: 3094-0768 PT Time Calculation (min) (ACUTE ONLY): 17 min   Charges:   PT Evaluation $PT Eval Moderate Complexity: 1 Mod          Reuel Derby, PT, DPT  Acute Rehabilitation Services  Pager: [phone] Office: [phone]   Rudean Hitt 05/22/2019, 5:30 PM

## 2019-05-22 NOTE — Progress Notes (Addendum)
Progress Note  Patient Name: Wesley Garcia Date of Encounter: 05/22/2019  Primary Cardiologist: Larae Grooms, MD   Subjective   No CP, intermittent SOB, eating breakfast with vigor  Inpatient Medications    Scheduled Meds: . amiodarone  200 mg Oral Daily  . furosemide  40 mg Intravenous Once  . insulin aspart  0-15 Units Subcutaneous TID WC  . insulin glargine  15 Units Subcutaneous Daily  . linagliptin  5 mg Oral Daily  . metoprolol tartrate  25 mg Oral TID  . multivitamin  1 tablet Oral Daily  . pantoprazole  40 mg Oral BID AC  . sodium chloride flush  3 mL Intravenous Q12H  . sodium chloride flush  3 mL Intravenous Q12H  . tamsulosin  0.4 mg Oral QHS   Continuous Infusions: . sodium chloride    .  ceFAZolin (ANCEF) IV 1 g (05/22/19 0517)   PRN Meds: sodium chloride, acetaminophen, ALPRAZolam, nitroGLYCERIN, ondansetron (ZOFRAN) IV, sodium chloride flush, zolpidem   Vital Signs    Vitals:   05/21/19 2328 05/22/19 0504 05/22/19 0605 05/22/19 0851  BP: 132/62 99/70 102/68 116/87  Pulse: 60  60 65  Resp:   20 20  Temp:  98 F (36.7 C) 98 F (36.7 C) 97.7 F (36.5 C)  TempSrc:  Oral Oral Oral  SpO2:   92% 96%  Weight:   68.4 kg     Intake/Output Summary (Last 24 hours) at 05/22/2019 0936 Last data filed at 05/22/2019 0517 Gross per 24 hour  Intake 679.28 ml  Output 600 ml  Net 79.28 ml   Last 3 Weights 05/22/2019 05/21/2019 05/20/2019  Weight (lbs) 150 lb 11.2 oz 149 lb 11.1 oz 155 lb  Weight (kg) 68.357 kg 67.9 kg 70.308 kg      Telemetry    A paced, V sensed, no arrhythmias - Personally Reviewed  ECG    Apaced, Vsensed, QTc 524 - Personally Reviewed  Physical Exam   GEN: No acute distress.   Neck: No JVD Cardiac: RRR, soft SM, no rubs, or gallops.  Respiratory: crackles b/l bases GI: Soft, nontender, non-distended  MS: 1++ edema; advanced atrophy Neuro:  hard of hearing, otherwise Nonfocal Psych: Normal affect   Labs    High  Sensitivity Troponin:   Recent Labs  Lab 05/20/19 1552 05/20/19 1955  TROPONINIHS 36* 34*      Chemistry Recent Labs  Lab 05/20/19 1552 05/20/19 1955 05/21/19 0335 05/22/19 0256  NA 139  --  140 139  K 3.4*  --  3.4* 4.1  CL 101  --  100 102  CO2 26  --  29 26  GLUCOSE 118*  --  43* 138*  BUN 25*  --  23 21  CREATININE 1.86*  --  1.91* 1.98*  CALCIUM 8.6*  --  8.4* 8.3*  PROT  --  6.0*  --   --   ALBUMIN  --  2.8*  --   --   AST  --  23  --   --   ALT  --  14  --   --   ALKPHOS  --  74  --   --   BILITOT  --  0.9  --   --   GFRNONAA 32*  --  31* 29*  GFRAA 37*  --  36* 34*  ANIONGAP 12  --  11 11     Hematology Recent Labs  Lab 05/20/19 1552  WBC 8.1  RBC 4.97  HGB 14.4  HCT 46.0  MCV 92.6  MCH 29.0  MCHC 31.3  RDW 14.8  PLT 253    BNP Recent Labs  Lab 05/20/19 1955  BNP 1,671.4*     DDimer No results for input(s): DDIMER in the last 168 hours.   Radiology    DG Chest 2 View Result Date: 05/22/2019 CLINICAL DATA:  Post cardiac device insertion EXAM: CHEST - 2 VIEW COMPARISON:  05/20/2019 FINDINGS: New LEFT subclavian transvenous pacemaker with leads projecting at RIGHT atrium and RIGHT ventricle. Rotated to the LEFT. Borderline enlargement of cardiac silhouette. Mediastinal contours and pulmonary vascularity normal for technique. Bibasilar pleural effusions and atelectasis. Upper lungs clear. No definite infiltrate or pneumothorax. Bones demineralized. IMPRESSION: No pneumothorax following pacemaker insertion. BILATERAL pleural effusions and bibasilar atelectasis. Electronically Signed   By: Lavonia Dana M.D.   On: 05/22/2019 08:26    Cardiac Studies   ECHO: 04/21/2019  1. Septal apical distal anterior wall mid and apical inferior wall  hypokinesis Basal function preserved Abnormal GLS -6. Left ventricular  ejection fraction, by estimation, is 25%. The left ventricle has severely  decreased function. The left ventricle  demonstrates regional wall  motion abnormalities (see scoring  diagram/findings for description). The left ventricular internal cavity  size was moderately to severely dilated. Left ventricular diastolic  parameters are indeterminate.   2. Right ventricular systolic function is normal. The right ventricular  size is normal. There is moderately elevated pulmonary artery systolic  pressure.   3. Left atrial size was mildly dilated.   4. The mitral valve is normal in structure and function. Mild mitral  valve regurgitation. No evidence of mitral stenosis.   5. The aortic valve is tricuspid. Aortic valve regurgitation is trivial.  Mild aortic valve sclerosis is present, with no evidence of aortic valve  stenosis.   6. The inferior vena cava is dilated in size with >50% respiratory  variability, suggesting right atrial pressure of 8 mmHg.   Patient Profile     86 y.o. male with a history of CKD IV, DM, HTN, squamous cell carcinoma of hard palate, fall with hip fracture s/p surgical repair 02/27/19, and persistent atrial fibrillation dx 03/14/2019, on Xarelto for CHA2DS2-VASc of 4, s/p DCCV 04/07/2019, recurrent A. fib and started on amiodarone 04/14/2019, repeat cardioversion on 05/12/2019.   He arrived to the Afib clinic yesterday with ERAF, rates 100's-110's and hypotensive with SBP 80's and referredto the ER. Initial plan was to pace and ablate   He is quite symptomatic, weak, SOB an on occassion associated with CP  Assessment & Plan    1.  Persistent atrial fibrillation, RVR      He has been on amiodarone since 04/14/2019, but only stayed in sinus rhythm a couple of days after his recent cardioversion       He has had conversion to SB high 40's PM 50's-60 this AM awake   Given this, rhythm control may still be on the table may be early to ablate his AVNode Tachy-brady  S/p  attempt for CRT-P though no viable/acceptable veins and is s/p dual chamber PPM implant yesterday He has had some oozing, though not actively  bleeding now, no hematoma Skin is very thin and fragile, Bulmaro Feagans place a very light pressure dressing and check later today, trying to avoid keeping on too long  Hold xarelto  This AM EKG is reviewed with Dr. Curt Bears, felt QT is acceptable for PO amiodarone Repeat in AM  2.  Pleural effusions 3.  Acute on chronic systolic CHF      remains with b/l pleural effusions      Unclear why, but did not get the lasix ordered for last night  Uncertain of I/O accuracy Yesterday AM charted negative 1257ml overnight, cumulative total charted to today is -1957ml (I spoke with evening nurse last night who reported to me his urine output was "very good" Not sure everything has been charted.  Weight about the same/up 1 pound   Lasix 40mg  today, asked RN to give now, pt  looks comfortable, follow response        4.  Diabetes:      Continue home medications with daytime only sliding scale insulin   5.  Hypertension      as above, adjusting BB    6. NSVT     None further   7. DM     Intermittent hypoglycemia requiring dextrose when NPO yesterday     follow now with meals     I don't think we can discharge today    For questions or updates, please contact [place] Please consult [email or web address] for contact info under        Signed, Baldwin Jamaica, PA-C  05/22/2019, 9:36 AM    I have seen and examined this patient with Tommye Standard.  Agree with above, note added to reflect my findings.  On exam, RRR, no murmurs, lungs with faint crackles.  Patient is status post [employer] dual-chamber pacemaker.  Device functioning appropriately.  Chest x-ray is without major abnormality other than pleural effusions.  At this point, he needs to continue to diuresis.  He certainly seems volume overloaded from his heart failure.  We Venson Ferencz continue with diuresis likely through the night tonight with a plan for potential discharge tomorrow.  Rubert Frediani M. Anyssa Sharpless MD 05/22/2019 12:20 PM

## 2019-05-22 NOTE — Progress Notes (Signed)
Soft BP - 94/47. BB not given at this time.

## 2019-05-23 ENCOUNTER — Telehealth (HOSPITAL_COMMUNITY): Payer: Self-pay | Admitting: Interventional Cardiology

## 2019-05-23 LAB — BASIC METABOLIC PANEL
Anion gap: 11 (ref 5–15)
BUN: 24 mg/dL — ABNORMAL HIGH (ref 8–23)
CO2: 26 mmol/L (ref 22–32)
Calcium: 8.1 mg/dL — ABNORMAL LOW (ref 8.9–10.3)
Chloride: 100 mmol/L (ref 98–111)
Creatinine, Ser: 2.34 mg/dL — ABNORMAL HIGH (ref 0.61–1.24)
GFR calc Af Amer: 28 mL/min — ABNORMAL LOW (ref >60)
GFR calc non Af Amer: 24 mL/min — ABNORMAL LOW (ref >60)
Glucose, Bld: 189 mg/dL — ABNORMAL HIGH (ref 70–99)
Potassium: 3.8 mmol/L (ref 3.5–5.1)
Sodium: 137 mmol/L (ref 135–145)

## 2019-05-23 LAB — GLUCOSE, CAPILLARY
Glucose-Capillary: 132 mg/dL — ABNORMAL HIGH (ref 70–99)
Glucose-Capillary: 220 mg/dL — ABNORMAL HIGH (ref 70–99)
Glucose-Capillary: 265 mg/dL — ABNORMAL HIGH (ref 70–99)
Glucose-Capillary: 139 mg/dL — ABNORMAL HIGH (ref 70–99)

## 2019-05-23 MED ORDER — FUROSEMIDE 10 MG/ML IJ SOLN
40.0000 mg | Freq: Once | INTRAMUSCULAR | Status: AC
  Administered 2019-05-23: 40 mg via INTRAVENOUS
  Filled 2019-05-23: qty 4

## 2019-05-23 MED ORDER — METOPROLOL TARTRATE 25 MG PO TABS
25.0000 mg | ORAL_TABLET | Freq: Two times a day (BID) | ORAL | Status: DC
  Administered 2019-05-24 – 2019-05-28 (×7): 25 mg via ORAL
  Filled 2019-05-23 (×8): qty 1

## 2019-05-23 MED ORDER — MUPIROCIN 2 % EX OINT
TOPICAL_OINTMENT | Freq: Two times a day (BID) | CUTANEOUS | Status: DC
  Filled 2019-05-23 (×2): qty 22

## 2019-05-23 NOTE — Telephone Encounter (Signed)
Patient cancelled and states he is in the hospital and no longer needs due to he just had a pacemaker implanted.  FYI

## 2019-05-23 NOTE — NC FL2 (Signed)
[place] MEDICAID FL2 LEVEL OF CARE SCREENING TOOL     IDENTIFICATION  Patient Name: Wesley Garcia Birthdate: Aug 20, 1929 Sex: male Admission Date (Current Location): 05/20/2019  [facility] and [place] Number:  Herbalist and Address:  [facility], [place] [address] [place], [place] [address]      Provider Number: [id number]  Attending Physician Name and Address:  Jettie Booze, MD  Relative Name and Phone Number:  [name] [phone]    Current Level of Care: Hospital Recommended Level of Care: [place] Prior Approval Number:    Date Approved/Denied:   PASRR Number: Pending  Discharge Plan: SNF    Current Diagnoses: Patient Active Problem List   Diagnosis Date Noted  . Pleural effusion 05/20/2019  . Persistent atrial fibrillation with rapid ventricular response ([place]) 05/20/2019  . Secondary hypercoagulable state ([place]) 03/25/2019  . Pressure injury of sacral region, stage 2 ([place]) 03/14/2019  . Acute prostatitis 03/14/2019  . S/P right hip fracture 03/14/2019  . Irregular heart beat 03/14/2019  . Persistent atrial fibrillation ([place]) 03/14/2019  . Depression, major, single episode, mild ([place]) 05/02/2018  . Balance problem 12/24/2017  . Falls frequently 12/24/2017  . Aortic atherosclerosis ([place]) 01/01/2017  . Chronic kidney disease, stage 4 (severe) ([place]) 08/14/2016  . Exudative age-related macular degeneration of left eye ([place]) 08/14/2016  . Tachycardia-bradycardia syndrome ([place]) 08/14/2016  . Localized osteoarthritis of left knee 08/14/2016  . Benign essential hypertension 08/14/2016  . DM (diabetes mellitus) type II controlled with renal manifestation ([place]) 04/17/2014  . Hyperlipidemia associated with type 2 diabetes mellitus ([place]) 06/10/2012  . Hypertensive kidney disease, benign(403.1) 06/10/2012  . Personal history of colonic adenomas 09/29/2004    Orientation RESPIRATION BLADDER Height & Weight     Self,  Time, Situation, Place  Normal Continent, External catheter Weight: 147 lb 0.8 oz (66.7 kg) Height:     BEHAVIORAL SYMPTOMS/MOOD NEUROLOGICAL BOWEL NUTRITION STATUS      Continent Diet(See discharge summary.)  AMBULATORY STATUS COMMUNICATION OF NEEDS Skin   Extensive Assist Verbally Other (Comment)(Closed incision. Adhesive strips)                       Personal Care Assistance Level of Assistance  Bathing, Dressing, Feeding Bathing Assistance: Maximum assistance Feeding assistance: Limited assistance Dressing Assistance: Maximum assistance     Functional Limitations Info  Sight, Hearing, Speech Sight Info: Adequate Hearing Info: Impaired(Hearing aids.) Speech Info: Adequate    SPECIAL CARE FACTORS FREQUENCY  PT (By licensed PT), OT (By licensed OT)     PT Frequency: 5x a week OT Frequency: 5x a week            Contractures Contractures Info: Not present    Additional Factors Info  Code Status, Allergies, Insulin Sliding Scale Code Status Info: DNR Allergies Info: Tape   Insulin Sliding Scale Info: insulin aspart (novoLOG) injection 0-9 Units       Current Medications (05/23/2019):  This is the current hospital active medication list Current Facility-Administered Medications  Medication Dose Route Frequency Provider Last Rate Last Admin  . 0.9 %  sodium chloride infusion  250 mL Intravenous PRN Barrett, Evelene Croon, PA-C      . acetaminophen (TYLENOL) tablet 325-650 mg  325-650 mg Oral Q4H PRN Camnitz, Will Hassell Done, MD      . ALPRAZolam Duanne Moron) tablet 0.25 mg  0.25 mg Oral BID PRN Barrett, Rhonda G, PA-C      . amiodarone (  PACERONE) tablet 200 mg  200 mg Oral Daily Baldwin Jamaica, PA-C   200 mg at 05/23/19 5035  . insulin aspart (novoLOG) injection 0-9 Units  0-9 Units Subcutaneous TID WC Baldwin Jamaica, PA-C      . insulin glargine (LANTUS) injection 15 Units  15 Units Subcutaneous Daily Einar Grad, RPH   15 Units at 05/23/19 4656  . linagliptin  (TRADJENTA) tablet 5 mg  5 mg Oral Daily Barrett, Rhonda G, PA-C   5 mg at 05/23/19 0956  . metoprolol tartrate (LOPRESSOR) tablet 25 mg  25 mg Oral TID Baldwin Jamaica, PA-C   25 mg at 05/23/19 0956  . multivitamin (PROSIGHT) tablet 1 tablet  1 tablet Oral Daily Jettie Booze, MD   1 tablet at 05/23/19 0957  . nitroGLYCERIN (NITROSTAT) SL tablet 0.4 mg  0.4 mg Sublingual Q5 Min x 3 PRN Barrett, Rhonda G, PA-C      . ondansetron (ZOFRAN) injection 4 mg  4 mg Intravenous Q6H PRN Camnitz, Will Hassell Done, MD      . pantoprazole (PROTONIX) EC tablet 40 mg  40 mg Oral BID AC Barrett, Rhonda G, PA-C   40 mg at 05/23/19 0956  . sodium chloride flush (NS) 0.9 % injection 3 mL  3 mL Intravenous Q12H Barrett, Rhonda G, PA-C   3 mL at 05/23/19 0551  . sodium chloride flush (NS) 0.9 % injection 3 mL  3 mL Intravenous PRN Barrett, Rhonda G, PA-C      . sodium chloride flush (NS) 0.9 % injection 3 mL  3 mL Intravenous Q12H Baldwin Jamaica, PA-C   3 mL at 05/23/19 0550  . tamsulosin (FLOMAX) capsule 0.4 mg  0.4 mg Oral QHS Barrett, Rhonda G, PA-C   0.4 mg at 05/22/19 2304  . zolpidem (AMBIEN) tablet 5 mg  5 mg Oral QHS PRN Barrett, Evelene Croon, PA-C   5 mg at 05/22/19 2304     Discharge Medications: Please see discharge summary for a list of discharge medications.  Relevant Imaging Results:  Relevant Lab Results:   Additional Information SSN [id number]  Orly Quimby J Riddick, LCSW

## 2019-05-23 NOTE — Progress Notes (Signed)
  Mobility Specialist Criteria Algorithm Info.  Mobility Team:  Activity: Ambulated in room;Transferred:  Bed to chair (to recliner chair ) Range of motion: Active;All extremities Level of assistance: Moderate assist, patient does 50-74% (Mod A supine to sit, Min A sit to stand, Min A ambulating) Assistive device: Front wheel walker Minutes sitting in chair:  Minutes stood: 2 minutes Minutes ambulated: 1 minutes Distance ambulated (ft): 5 ft Mobility response: Tolerated poorly;RN notified (HOH; Needs multiple cues for safety and hand placement on walker. Reports being weaker than baseline. C/o dizziness while ambulating, LOB and x1, legs buckled x2) Bed Position: Chair (Recliner chair )  Pt willing and eager to participate in mobility, reporting that at home he's regularly active but has had multiple falls within the last 6 months. Reported being weaker and more deconditioned than baseline. Needed mod A for supine/sit and min A to stand from the EOB with walker. Pt ambulated approximately 32ft and stated he felt dizziness/lightheadness and immediately tried to hurry to sit down. Was able to take steps backwards but legs began to buckle causing LOB requiring min/mod A to balance. Once pt was seated safely in chair, BP was took x3 and noted to be lower than baseline (in flowsheet).  Pre-ambulation: HR 62; BP: 110/57 Post-ambulation: HR 90; BP: 52/21 (29)         05/23/2019 2:29 PM

## 2019-05-23 NOTE — TOC Initial Note (Addendum)
Transition of Care [facility]) - Initial/Assessment Note    Patient Details  Name: Wesley Garcia MRN: [id number] Date of Birth: 01/28/1930  [facility] CM/SW Contact:    Arvella Merles, LCSW Phone Number: 05/23/2019, 12:29 PM  Clinical Narrative:                 CSW received consult for possible SNF placement at time of discharge. CSW spoke with patient regarding PT recommendation of SNF placement at time of discharge. CSW met with patient bedside to discuss recommendation. Patient expressed understanding of PT recommendation and is agreeable to SNF placement at time of discharge.  Patient requested CSW contact his daughter-in-law [name] to further discuss. CSW later met bedside with patient, patient's wife and his daughter in law. Patient's family reports preference for [facility], which is close to their home.  CSW discussed insurance authorization process and provided Medicare SNF ratings list.  No further questions reported at this time.  CSW made telephone call to [facility] to inquire on bed availability. CSW was informed they are able to accept patient. CSW initiated insurance authorization Ref [id number]. CSW to continue to follow and assist with discharge planning needs.   Expected Discharge Plan: Skilled Nursing Facility Barriers to Discharge: Insurance Authorization   Patient Goals and CMS Choice   CMS Medicare.gov Compare Post Acute Care list provided to:: Patient Represenative (must comment)([place]) Choice offered to / list presented to : Adult Children  Expected Discharge Plan and Services Expected Discharge Plan: [place] In-house Referral: Clinical Social Work   Post Acute Care Choice: [place] Living arrangements for the past 2 months: [place]                                      Prior Living Arrangements/Services Living arrangements for the past 2 months: Single Family Home Lives with::  Spouse Patient language and need for interpreter reviewed:: Yes Do you feel safe going back to the place where you live?: Yes      Need for Family Participation in Patient Care: Yes (Comment) Care giver support system in place?: Yes (comment)   Criminal Activity/Legal Involvement Pertinent to Current Situation/Hospitalization: No - Comment as needed  Activities of Daily Living Home Assistive Devices/Equipment: Eyeglasses, Gilford Rile (specify type) ADL Screening (condition at time of admission) Patient's cognitive ability adequate to safely complete daily activities?: Yes Is the patient deaf or have difficulty hearing?: Yes Does the patient have difficulty seeing, even when wearing glasses/contacts?: Yes Does the patient have difficulty concentrating, remembering, or making decisions?: No Patient able to express need for assistance with ADLs?: Yes Does the patient have difficulty dressing or bathing?: No Independently performs ADLs?: Yes (appropriate for developmental age) Does the patient have difficulty walking or climbing stairs?: Yes Weakness of Legs: Both Weakness of Arms/Hands: None  Permission Sought/Granted Permission sought to share information with : Facility Sport and exercise psychologist, Family Supports Permission granted to share information with : Yes, Verbal Permission Granted  Share Information with NAME: [name]  Permission granted to share info w AGENCY: SNF's  Permission granted to share info w Relationship: Daughter-in-law  Permission granted to share info w Contact Information: [phone]  Emotional Assessment Appearance:: Appears stated age Attitude/Demeanor/Rapport: Unable to Assess Affect (typically observed): Flat Orientation: : Oriented to Self, Oriented to Place, Oriented to  Time Alcohol / Substance Use: Not Applicable Psych Involvement: No (comment)  Admission diagnosis:  Pleural effusion [J90] Atrial fibrillation with RVR (HCC) [I48.91] Chest pain  [R07.9] Persistent atrial fibrillation with rapid ventricular response (HCC) [I48.19] Acute on chronic congestive heart failure, unspecified heart failure type [facility]) [I50.9] Patient Active Problem List   Diagnosis Date Noted  . Pleural effusion 05/20/2019  . Persistent atrial fibrillation with rapid ventricular response ([place]) 05/20/2019  . Secondary hypercoagulable state ([place]) 03/25/2019  . Pressure injury of sacral region, stage 2 ([place]) 03/14/2019  . Acute prostatitis 03/14/2019  . S/P right hip fracture 03/14/2019  . Irregular heart beat 03/14/2019  . Persistent atrial fibrillation ([place]) 03/14/2019  . Depression, major, single episode, mild ([place]) 05/02/2018  . Balance problem 12/24/2017  . Falls frequently 12/24/2017  . Aortic atherosclerosis ([place]) 01/01/2017  . Chronic kidney disease, stage 4 (severe) ([place]) 08/14/2016  . Exudative age-related macular degeneration of left eye ([place]) 08/14/2016  . Tachycardia-bradycardia syndrome ([place]) 08/14/2016  . Localized osteoarthritis of left knee 08/14/2016  . Benign essential hypertension 08/14/2016  . DM (diabetes mellitus) type II controlled with renal manifestation ([place]) 04/17/2014  . Hyperlipidemia associated with type 2 diabetes mellitus ([place]) 06/10/2012  . Hypertensive kidney disease, benign(403.1) 06/10/2012  . Personal history of colonic adenomas 09/29/2004   PCP:  Gayland Curry, DO Pharmacy:   [id number] [place] [id number]: [phone]: [phone]    Social Determinants of Health (SDOH) Interventions    Readmission Risk Interventions No flowsheet data found.

## 2019-05-23 NOTE — Progress Notes (Addendum)
Progress Note  Patient Name: Wesley Garcia Date of Encounter: 05/23/2019  Primary Cardiologist: Larae Grooms, MD   Subjective   No CP, intermittent SOB, feeling better today  Inpatient Medications    Scheduled Meds:  amiodarone  200 mg Oral Daily   insulin aspart  0-9 Units Subcutaneous TID WC   insulin glargine  15 Units Subcutaneous Daily   linagliptin  5 mg Oral Daily   metoprolol tartrate  25 mg Oral TID   multivitamin  1 tablet Oral Daily   pantoprazole  40 mg Oral BID AC   sodium chloride flush  3 mL Intravenous Q12H   sodium chloride flush  3 mL Intravenous Q12H   tamsulosin  0.4 mg Oral QHS   Continuous Infusions:  sodium chloride     PRN Meds: sodium chloride, acetaminophen, ALPRAZolam, nitroGLYCERIN, ondansetron (ZOFRAN) IV, sodium chloride flush, zolpidem   Vital Signs    Vitals:   05/22/19 1651 05/22/19 2019 05/23/19 0518 05/23/19 0852  BP: (!) 94/47 (!) 109/47 114/61 (!) 117/59  Pulse: (!) 58 62 64   Resp:  19 19   Temp:  98 F (36.7 C) 98.3 F (36.8 C) 97.7 F (36.5 C)  TempSrc:  Oral Oral Oral  SpO2: 97% 94% 100%   Weight:   66.7 kg     Intake/Output Summary (Last 24 hours) at 05/23/2019 1020 Last data filed at 05/23/2019 2330 Gross per 24 hour  Intake 1036 ml  Output 1875 ml  Net -839 ml   Last 3 Weights 05/23/2019 05/22/2019 05/21/2019  Weight (lbs) 147 lb 0.8 oz 150 lb 11.2 oz 149 lb 11.1 oz  Weight (kg) 66.7 kg 68.357 kg 67.9 kg      Telemetry    A paced, V sensed, no arrhythmias - Personally Reviewed  ECG    Apaced, Vsensed, QT stable/improved - Personally Reviewed  Physical Exam   GEN: elderly man in no acute distress, appears chronically ill, .   Neck: No JVD Cardiac: RRR, soft SM, no rubs, or gallops.  Respiratory: crackles b/l bases GI: Soft, nontender, non-distended  MS: 1++ edema; advanced atrophy Neuro:  hard of hearing, otherwise Nonfocal Psych: Normal affect   PPM site with no hematoma, though extensive  bruising  Labs    High Sensitivity Troponin:   Recent Labs  Lab 05/20/19 1552 05/20/19 1955  TROPONINIHS 36* 34*      Chemistry Recent Labs  Lab 05/20/19 1552 05/20/19 1955 05/21/19 0335 05/22/19 0256 05/23/19 0229  NA   < >  --  140 139 137  K   < >  --  3.4* 4.1 3.8  CL   < >  --  100 102 100  CO2   < >  --  29 26 26   GLUCOSE   < >  --  43* 138* 189*  BUN   < >  --  23 21 24*  CREATININE   < >  --  1.91* 1.98* 2.34*  CALCIUM   < >  --  8.4* 8.3* 8.1*  PROT  --  6.0*  --   --   --   ALBUMIN  --  2.8*  --   --   --   AST  --  23  --   --   --   ALT  --  14  --   --   --   ALKPHOS  --  74  --   --   --  BILITOT  --  0.9  --   --   --   GFRNONAA   < >  --  31* 29* 24*  GFRAA   < >  --  36* 34* 28*  ANIONGAP   < >  --  11 11 11    < > = values in this interval not displayed.     Hematology Recent Labs  Lab 05/20/19 1552  WBC 8.1  RBC 4.97  HGB 14.4  HCT 46.0  MCV 92.6  MCH 29.0  MCHC 31.3  RDW 14.8  PLT 253    BNP Recent Labs  Lab 05/20/19 1955  BNP 1,671.4*     DDimer No results for input(s): DDIMER in the last 168 hours.   Radiology    DG Chest 2 View Result Date: 05/22/2019 CLINICAL DATA:  Post cardiac device insertion EXAM: CHEST - 2 VIEW COMPARISON:  05/20/2019 FINDINGS: New LEFT subclavian transvenous pacemaker with leads projecting at RIGHT atrium and RIGHT ventricle. Rotated to the LEFT. Borderline enlargement of cardiac silhouette. Mediastinal contours and pulmonary vascularity normal for technique. Bibasilar pleural effusions and atelectasis. Upper lungs clear. No definite infiltrate or pneumothorax. Bones demineralized. IMPRESSION: No pneumothorax following pacemaker insertion. BILATERAL pleural effusions and bibasilar atelectasis. Electronically Signed   By: Lavonia Dana M.D.   On: 05/22/2019 08:26    Cardiac Studies   ECHO: 04/21/2019  1. Septal apical distal anterior wall mid and apical inferior wall  hypokinesis Basal function  preserved Abnormal GLS -6. Left ventricular  ejection fraction, by estimation, is 25%. The left ventricle has severely  decreased function. The left ventricle  demonstrates regional wall motion abnormalities (see scoring  diagram/findings for description). The left ventricular internal cavity  size was moderately to severely dilated. Left ventricular diastolic  parameters are indeterminate.   2. Right ventricular systolic function is normal. The right ventricular  size is normal. There is moderately elevated pulmonary artery systolic  pressure.   3. Left atrial size was mildly dilated.   4. The mitral valve is normal in structure and function. Mild mitral  valve regurgitation. No evidence of mitral stenosis.   5. The aortic valve is tricuspid. Aortic valve regurgitation is trivial.  Mild aortic valve sclerosis is present, with no evidence of aortic valve  stenosis.   6. The inferior vena cava is dilated in size with >50% respiratory  variability, suggesting right atrial pressure of 8 mmHg.   Patient Profile     86 y.o. male with a history of CKD IV, DM, HTN, squamous cell carcinoma of hard palate, fall with hip fracture s/p surgical repair 02/27/19, and persistent atrial fibrillation dx 03/14/2019, on Xarelto for CHA2DS2-VASc of 4, s/p DCCV 04/07/2019, recurrent A. fib and started on amiodarone 04/14/2019, repeat cardioversion on 05/12/2019.   He arrived to the Afib clinic yesterday with ERAF, rates 100's-110's and hypotensive with SBP 80's and referredto the ER. Initial plan was to pace and ablate   He is quite symptomatic, weak, SOB an on occassion associated with CP  Assessment & Plan    1.  Persistent atrial fibrillation, RVR      He has been on amiodarone since 04/14/2019, but only stayed in sinus rhythm a couple of days after his recent cardioversion       He has had conversion to SB high 40's PM 50's-60 this AM awake   Given this, rhythm control may still be on the table may be early  to ablate his AVNode Tachy-brady  S/p  attempt for CRT-P though no viable/acceptable veins and is s/p dual chamber PPM implant yesterday He has had some oozing early, though no bleeding through yesterday Skin is very thin and fragile Hold xarelto for now given extensive ecchymosis at site  Post implant EKG was reviewed with Dr. Curt Bears, felt QT is acceptable for PO amiodarone Repeat this morning is stable    Continue amiodarone and metoprolol    2.  Pleural effusions 3.  Acute on chronic systolic CHF      remains with volume OL    Lasix 40mg  IV again today, Creat is holding        4.  Diabetes:      Continue same, appreciate DM coordinator recs    5.  Hypertension      no changes today    6. NSVT     None further  7. Functional decline     PT recs noted for SNF     Case management aware     Continue with PT while here     We have asked for general cardiology team to take over. EP service though remain available, please recall if needed.   For questions or updates, please contact [place] Please consult [email or web address] for contact info under        Signed, Baldwin [place], PA-C  05/23/2019, 10:20 AM    I have seen and examined this patient with Tommye Standard.  Agree with above, note added to reflect my findings.  On exam, RRR, no murmurs, crackles at the bases. Patient with continued volume overload. Pacemaker site stable with hematoma and burising. Held off on pressure dressing due to high likelyhood of skin tears. Armine Rizzolo continue with diuresis as has crackles and 2+ edema. Audri Kozub likely need discharge to SNF for rehab. EP to see as needed over the weekend.    Kenly Xiao M. Dailon Sheeran MD 05/23/2019 12:19 PM

## 2019-05-23 NOTE — TOC Progression Note (Signed)
Transition of Care [facility]) - Progression Note    Patient Details  Name: Wesley Garcia MRN: [id number] Date of Birth: 1929-05-20  Transition of Care [facility]) CM/SW [place], [place] Phone Number: 05/23/2019, 4:29 PM  Clinical Narrative:    CSW was contacted by [facility] and informed patient can not discharge to facility today, they are able to take patient Saturday. Contact nurse desk [phone] once patient ready for discharge. TOC team will continue to follow & assist with discharge planning needs.   Expected Discharge Plan: Skilled Nursing Facility Barriers to Discharge: Insurance Authorization  Expected Discharge Plan and Services Expected Discharge Plan: [place] In-house Referral: Clinical Social Work   Post Acute Care Choice: [place] Living arrangements for the past 2 months: Single Family Home                                       Social Determinants of Health (SDOH) Interventions    Readmission Risk Interventions No flowsheet data found.

## 2019-05-24 ENCOUNTER — Inpatient Hospital Stay (HOSPITAL_COMMUNITY): Payer: Medicare PPO

## 2019-05-24 DIAGNOSIS — R531 Weakness: Secondary | ICD-10-CM

## 2019-05-24 DIAGNOSIS — I5023 Acute on chronic systolic (congestive) heart failure: Secondary | ICD-10-CM

## 2019-05-24 DIAGNOSIS — I472 Ventricular tachycardia: Secondary | ICD-10-CM

## 2019-05-24 DIAGNOSIS — I4729 Other ventricular tachycardia: Secondary | ICD-10-CM

## 2019-05-24 DIAGNOSIS — Z950 Presence of cardiac pacemaker: Secondary | ICD-10-CM | POA: Insufficient documentation

## 2019-05-24 DIAGNOSIS — I959 Hypotension, unspecified: Secondary | ICD-10-CM | POA: Insufficient documentation

## 2019-05-24 LAB — BASIC METABOLIC PANEL
Anion gap: 13 (ref 5–15)
BUN: 27 mg/dL — ABNORMAL HIGH (ref 8–23)
CO2: 25 mmol/L (ref 22–32)
Calcium: 8.5 mg/dL — ABNORMAL LOW (ref 8.9–10.3)
Chloride: 99 mmol/L (ref 98–111)
Creatinine, Ser: 2.16 mg/dL — ABNORMAL HIGH (ref 0.61–1.24)
GFR calc Af Amer: 31 mL/min — ABNORMAL LOW (ref >60)
GFR calc non Af Amer: 27 mL/min — ABNORMAL LOW (ref >60)
Glucose, Bld: 78 mg/dL (ref 70–99)
Potassium: 3.7 mmol/L (ref 3.5–5.1)
Sodium: 137 mmol/L (ref 135–145)

## 2019-05-24 LAB — GLUCOSE, CAPILLARY
Glucose-Capillary: 183 mg/dL — ABNORMAL HIGH (ref 70–99)
Glucose-Capillary: 169 mg/dL — ABNORMAL HIGH (ref 70–99)
Glucose-Capillary: 75 mg/dL (ref 70–99)
Glucose-Capillary: 179 mg/dL — ABNORMAL HIGH (ref 70–99)

## 2019-05-24 LAB — MAGNESIUM: Magnesium: 1.9 mg/dL (ref 1.7–2.4)

## 2019-05-24 LAB — SARS CORONAVIRUS 2 (TAT 6-24 HRS): SARS Coronavirus 2: NEGATIVE

## 2019-05-24 MED ORDER — POTASSIUM CHLORIDE CRYS ER 20 MEQ PO TBCR
20.0000 meq | EXTENDED_RELEASE_TABLET | Freq: Every day | ORAL | Status: DC
  Administered 2019-05-24 – 2019-05-29 (×6): 20 meq via ORAL
  Filled 2019-05-24 (×6): qty 1

## 2019-05-24 MED ORDER — FUROSEMIDE 10 MG/ML IJ SOLN
80.0000 mg | Freq: Two times a day (BID) | INTRAMUSCULAR | Status: AC
  Administered 2019-05-24 – 2019-05-25 (×2): 80 mg via INTRAVENOUS
  Filled 2019-05-24 (×2): qty 8

## 2019-05-24 MED ORDER — POTASSIUM CHLORIDE CRYS ER 20 MEQ PO TBCR
20.0000 meq | EXTENDED_RELEASE_TABLET | Freq: Once | ORAL | Status: AC
  Administered 2019-05-24: 20 meq via ORAL
  Filled 2019-05-24: qty 1

## 2019-05-24 MED ORDER — FUROSEMIDE 80 MG PO TABS
80.0000 mg | ORAL_TABLET | Freq: Every day | ORAL | Status: DC
  Administered 2019-05-24: 80 mg via ORAL
  Filled 2019-05-24: qty 1

## 2019-05-24 NOTE — Plan of Care (Signed)
  Problem: Clinical Measurements: Goal: Will remain free from infection Outcome: Progressing Goal: Respiratory complications will improve Outcome: Progressing Goal: Cardiovascular complication will be avoided Outcome: Progressing   

## 2019-05-24 NOTE — TOC Progression Note (Signed)
Transition of Care [facility]) - Progression Note    Patient Details  Name: Wesley Garcia MRN: [id number] Date of Birth: 1929-08-28  Transition of Care [facility]) CM/SW [place], [place] Phone Number: [phone] 05/24/2019, 3:41 PM  Clinical Narrative:     CSW messaged RN about patient being medically stable for discharge due to [place] coming back. CSW also received insurance authorization;  Reference # [id number] Dates: 3/19-3/22  RN stated that patient was not medically stable today.  TOC team will continue to follow for discharge planning needs.   Expected Discharge Plan: Skilled Nursing Facility Barriers to Discharge: Insurance Authorization  Expected Discharge Plan and Services Expected Discharge Plan: [place] In-house Referral: Clinical Social Work   Post Acute Care Choice: [place] Living arrangements for the past 2 months: Single Family Home                                       Social Determinants of Health (SDOH) Interventions    Readmission Risk Interventions No flowsheet data found.

## 2019-05-24 NOTE — Discharge Summary (Incomplete)
?  Discharge Summary  ?  ?Patient ID: Wesley Garcia ?MRN: [id number]; DOB: 07/05/1929 ? ?Admit date: 05/20/2019 ?Discharge date: 05/24/2019 ? ?Primary Care Provider: Gayland Curry, DO  ?Primary Cardiologist: Larae Grooms, MD  ?Primary Electrophysiologist:  None  ? ?Discharge Diagnoses  ?  ?Principal Problem: ?  Persistent atrial fibrillation ([place]) ?Active Problems: ?  Diabetes mellitus type 2, uncomplicated ([place]) ?  Chronic kidney disease, stage 4 (severe) (HCC) ?  Tachycardia-bradycardia syndrome ([place]) ?  Benign essential hypertension ?  Pleural effusion ?  Persistent atrial fibrillation with rapid ventricular response ([place]) ?  Hypotension ?  History of pacemaker ?  Weakness ?  Nonsustained ventricular tachycardia ([place]) ?  Acute on chronic systolic heart failure ([place]) ? ? ?Diagnostic Studies/Procedures  ?  ?PPM/ICD Implant ? PROCEDURES:  ? 1. Pacemaker implantation. ? 2. Left upper extremity venography.  ? ?CONCLUSIONS:  ? 1. Successful implantation of a [employer] Jude Medical [employer]urity MRI dual-chamber pacemaker for symptomatic bradycardia ? 2. No early apparent complications.  ?_____________ ?  ?History of Present Illness   ?  ? Wesley Garcia is a 86 y.o. male with medical history of CKD stage IV, diabetes type 2, hypertension, squamous cell carcinoma of the hard palate, secondary hypercoagulable state, fall with hip fracture status post surgical repair 02/27/2019, persistent atrial fibrillation on Xarelto (CHADSVASC 4) who was seen seen for recurrent A. Fib.  The patient was diagnosed with A. fib back in January 2021 and had undergone multiple cardioversions, the most recent 05/12/2019.  He was on Lopressor for rate control.  Patient was seen in his primary care office 04/10/2019 found to be in A. fib RVR with symptoms of shortness of breath and palpitations and was subsequently started on amiodarone. The patient was seen in the A. fib clinic on 05/20/2019 found to be in A. fib RVR with hypotension, leg edema, and extreme weakness and was transferred to the hospital by amb

## 2019-05-24 NOTE — Progress Notes (Addendum)
Progress Note  Patient Name: Wesley Garcia Date of Encounter: 05/24/2019  Primary Cardiologist: Larae Grooms, MD   Subjective   Cardiology service assuming care from EP s/p dual chamber PPM  Patient feels well and feels breathing and volume status are better.   Inpatient Medications    Scheduled Meds: . amiodarone  200 mg Oral Daily  . insulin aspart  0-9 Units Subcutaneous TID WC  . insulin glargine  15 Units Subcutaneous Daily  . linagliptin  5 mg Oral Daily  . metoprolol tartrate  25 mg Oral BID  . multivitamin  1 tablet Oral Daily  . mupirocin ointment   Nasal BID  . pantoprazole  40 mg Oral BID AC  . sodium chloride flush  3 mL Intravenous Q12H  . sodium chloride flush  3 mL Intravenous Q12H  . tamsulosin  0.4 mg Oral QHS   Continuous Infusions: . sodium chloride     PRN Meds: sodium chloride, acetaminophen, ALPRAZolam, nitroGLYCERIN, ondansetron (ZOFRAN) IV, sodium chloride flush, zolpidem   Vital Signs    Vitals:   05/23/19 1628 05/23/19 2036 05/24/19 0100 05/24/19 0430  BP: 117/65 (!) 152/61  (!) 127/57  Pulse: 80 62  68  Resp: 18 18  16   Temp: (!) 97.5 F (36.4 C) 97.9 F (36.6 C)  97.7 F (36.5 C)  TempSrc: Oral Oral  Oral  SpO2: 93% 100% 100% 97%  Weight:      Height:        Intake/Output Summary (Last 24 hours) at 05/24/2019 0748 Last data filed at 05/23/2019 1633 Gross per 24 hour  Intake 720 ml  Output 1800 ml  Net -1080 ml   Last 3 Weights 05/23/2019 05/22/2019 05/21/2019  Weight (lbs) 147 lb 0.8 oz 150 lb 11.2 oz 149 lb 11.1 oz  Weight (kg) 66.7 kg 68.357 kg 67.9 kg      Telemetry    A paced, V sensed, no arrhythmias - Personally Reviewed  ECG    Apaced, Vsensed, QT stable/improved, 05/23/19 -  I have personally reviewed   Physical Exam   Constitutional: No acute distress Eyes: sclera non-icteric, normal conjunctiva and lids ENMT: dentures in place, moist mucous membranes Cardiovascular: regular rhythm, normal rate, soft  systolic murmur. S1 and S2 normal. No jugular venous distention. Left chest wall ppm site with extensive ecchymosis. No definite hematoma. Site is dressed with steristrips, without evidence of wound dehiscence or oozing. Respiratory: clear to auscultation bilaterally, upper respiratory sounds GI : normal bowel sounds, soft and nontender. No distention.   MSK: extremities warm, well perfused. Trace-1+ bilateral edema at the ankles NEURO: grossly nonfocal exam, moves all extremities. [facility] PSYCH: alert and oriented x 3, normal mood and affect.   Labs    High Sensitivity Troponin:   Recent Labs  Lab 05/20/19 1552 05/20/19 1955  TROPONINIHS 36* 34*      Chemistry Recent Labs  Lab 05/20/19 1955 05/21/19 0335 05/22/19 0256 05/23/19 0229 05/24/19 0232  NA  --    < > 139 137 137  K  --    < > 4.1 3.8 3.7  CL  --    < > 102 100 99  CO2  --    < > 26 26 25   GLUCOSE  --    < > 138* 189* 78  BUN  --    < > 21 24* 27*  CREATININE  --    < > 1.98* 2.34* 2.16*  CALCIUM  --    < >  8.3* 8.1* 8.5*  PROT 6.0*  --   --   --   --   ALBUMIN 2.8*  --   --   --   --   AST 23  --   --   --   --   ALT 14  --   --   --   --   ALKPHOS 74  --   --   --   --   BILITOT 0.9  --   --   --   --   GFRNONAA  --    < > 29* 24* 27*  GFRAA  --    < > 34* 28* 31*  ANIONGAP  --    < > 11 11 13    < > = values in this interval not displayed.     Hematology Recent Labs  Lab 05/20/19 1552  WBC 8.1  RBC 4.97  HGB 14.4  HCT 46.0  MCV 92.6  MCH 29.0  MCHC 31.3  RDW 14.8  PLT 253    BNP Recent Labs  Lab 05/20/19 1955  BNP 1,671.4*     DDimer No results for input(s): DDIMER in the last 168 hours.   Radiology    DG Chest 2 View Result Date: 05/22/2019 CLINICAL DATA:  Post cardiac device insertion EXAM: CHEST - 2 VIEW COMPARISON:  05/20/2019 FINDINGS: New LEFT subclavian transvenous pacemaker with leads projecting at RIGHT atrium and RIGHT ventricle. Rotated to the LEFT. Borderline enlargement of  cardiac silhouette. Mediastinal contours and pulmonary vascularity normal for technique. Bibasilar pleural effusions and atelectasis. Upper lungs clear. No definite infiltrate or pneumothorax. Bones demineralized. IMPRESSION: No pneumothorax following pacemaker insertion. BILATERAL pleural effusions and bibasilar atelectasis. Electronically Signed   By: Lavonia Dana M.D.   On: 05/22/2019 08:26    Cardiac Studies   ECHO: 04/21/2019  1. Septal apical distal anterior wall mid and apical inferior wall  hypokinesis Basal function preserved Abnormal GLS -6. Left ventricular  ejection fraction, by estimation, is 25%. The left ventricle has severely  decreased function. The left ventricle  demonstrates regional wall motion abnormalities (see scoring  diagram/findings for description). The left ventricular internal cavity  size was moderately to severely dilated. Left ventricular diastolic  parameters are indeterminate.   2. Right ventricular systolic function is normal. The right ventricular  size is normal. There is moderately elevated pulmonary artery systolic  pressure.   3. Left atrial size was mildly dilated.   4. The mitral valve is normal in structure and function. Mild mitral  valve regurgitation. No evidence of mitral stenosis.   5. The aortic valve is tricuspid. Aortic valve regurgitation is trivial.  Mild aortic valve sclerosis is present, with no evidence of aortic valve  stenosis.   6. The inferior vena cava is dilated in size with >50% respiratory  variability, suggesting right atrial pressure of 8 mmHg.   Patient Profile     86 y.o. male with a history of CKD IV, DM, HTN, squamous cell carcinoma of hard palate, fall with hip fracture s/p surgical repair 02/27/19, and persistent atrial fibrillation dx 03/14/2019, on Xarelto for CHA2DS2-VASc of 4, s/p DCCV 04/07/2019, recurrent A. fib and started on amiodarone 04/14/2019, repeat cardioversion on 05/12/2019.He arrived to the Afib clinic with  ERAF, rates 100's-110's and hypotensive with SBP 80's and referredto the ER. Initial plan was to pace and ablate. He was quite symptomatic, weak, SOB an on occassion associated with CP. Received dual chamber PPM by [facility] on  05/21/19. Xarelto on hold due to extensive pacemaker site ecchymosis.   Assessment & Plan   1.  Persistent atrial fibrillation, RVR     - He has been on amiodarone since 04/14/2019, but only stayed in sinus rhythm a couple of days after his recent cardioversion    - in sinus on ECG today.       - S/p  attempt for CRT-P though no viable/acceptable veins and is s/p dual chamber PPM implant 3/17 He has had some oozing early, though no bleeding.  Skin is very thin and fragile Hold xarelto for now given extensive ecchymosis at site, reviewed with EP, will hold until follow up, review then.   - Post implant EKG was reviewed by Dr. Curt Bears, felt QT is acceptable for PO amiodarone Repeat ECG QTc improved/stable, however today QTc is 517 ms. Will review with EP - recommend rechecking an ECG tomorrow am and continuing amiodarone 200 mg daily. -Continue amiodarone and metoprolol   2.  Pleural effusions 3.  Acute on chronic systolic CHF 4. CKD stage IV - lasix 40 IV mg on 3/17 and 3/18, none received yesterday - I/O 1.8 L output yesterday, net - 3.35 L for the admission.  - Cr baseline is approximately 1.8-1.9, increased to 2.34 yesterday but improved today to 2.16. On exam volume status appears to have improved - plan for lasix 80 mg po daily for 3 days, then lasix 80 mg po PRN swelling or increase in weight. Plan BMET in 3-5 days after dismissal.       5.  Diabetes: - Continue same, appreciate DM coordinator recs -BG 183 this am.   6.  Hypertension - no changes today, BP and pulse stable.   7.  NSVT     None further  8. Functional decline     PT recs noted for SNF     Case management aware     Continue with PT while here - plan for SNF dismissal tomorrow if ECG stable  on amiodarone.  Plan follow up with Dr. Irish Lack in 2-4 weeks.   For questions or updates, please contact [place] Please consult [email or web address] for contact info under      Signed, Elouise Munroe, MD  05/24/2019, 7:48 AM

## 2019-05-25 LAB — BASIC METABOLIC PANEL
Anion gap: 12 (ref 5–15)
Anion gap: 15 (ref 5–15)
BUN: 31 mg/dL — ABNORMAL HIGH (ref 8–23)
BUN: 34 mg/dL — ABNORMAL HIGH (ref 8–23)
CO2: 25 mmol/L (ref 22–32)
CO2: 22 mmol/L (ref 22–32)
Calcium: 8.2 mg/dL — ABNORMAL LOW (ref 8.9–10.3)
Calcium: 8.2 mg/dL — ABNORMAL LOW (ref 8.9–10.3)
Chloride: 97 mmol/L — ABNORMAL LOW (ref 98–111)
Chloride: 96 mmol/L — ABNORMAL LOW (ref 98–111)
Creatinine, Ser: 2.26 mg/dL — ABNORMAL HIGH (ref 0.61–1.24)
Creatinine, Ser: 2.32 mg/dL — ABNORMAL HIGH (ref 0.61–1.24)
GFR calc Af Amer: 29 mL/min — ABNORMAL LOW (ref >60)
GFR calc Af Amer: 28 mL/min — ABNORMAL LOW (ref >60)
GFR calc non Af Amer: 25 mL/min — ABNORMAL LOW (ref >60)
GFR calc non Af Amer: 24 mL/min — ABNORMAL LOW (ref >60)
Glucose, Bld: 225 mg/dL — ABNORMAL HIGH (ref 70–99)
Glucose, Bld: 169 mg/dL — ABNORMAL HIGH (ref 70–99)
Potassium: 3.9 mmol/L (ref 3.5–5.1)
Potassium: 4.3 mmol/L (ref 3.5–5.1)
Sodium: 134 mmol/L — ABNORMAL LOW (ref 135–145)
Sodium: 133 mmol/L — ABNORMAL LOW (ref 135–145)

## 2019-05-25 LAB — CULTURE, BLOOD (ROUTINE X 2)
Culture: NO GROWTH
Culture: NO GROWTH
Special Requests: ADEQUATE
Special Requests: ADEQUATE

## 2019-05-25 LAB — MAGNESIUM: Magnesium: 1.8 mg/dL (ref 1.7–2.4)

## 2019-05-25 LAB — GLUCOSE, CAPILLARY
Glucose-Capillary: 204 mg/dL — ABNORMAL HIGH (ref 70–99)
Glucose-Capillary: 245 mg/dL — ABNORMAL HIGH (ref 70–99)
Glucose-Capillary: 163 mg/dL — ABNORMAL HIGH (ref 70–99)
Glucose-Capillary: 140 mg/dL — ABNORMAL HIGH (ref 70–99)

## 2019-05-25 MED ORDER — FUROSEMIDE 10 MG/ML IJ SOLN
80.0000 mg | Freq: Two times a day (BID) | INTRAMUSCULAR | Status: AC
  Administered 2019-05-25 – 2019-05-26 (×2): 80 mg via INTRAVENOUS
  Filled 2019-05-25 (×2): qty 8

## 2019-05-25 MED ORDER — MAGNESIUM SULFATE IN D5W 1-5 GM/100ML-% IV SOLN
1.0000 g | Freq: Once | INTRAVENOUS | Status: AC
  Administered 2019-05-25: 1 g via INTRAVENOUS
  Filled 2019-05-25: qty 100

## 2019-05-25 MED ORDER — AMIODARONE HCL IN DEXTROSE 360-4.14 MG/200ML-% IV SOLN
60.0000 mg/h | INTRAVENOUS | Status: AC
  Administered 2019-05-25 (×2): 60 mg/h via INTRAVENOUS
  Filled 2019-05-25 (×2): qty 200

## 2019-05-25 MED ORDER — AMIODARONE IV BOLUS ONLY 150 MG/100ML
150.0000 mg | Freq: Once | INTRAVENOUS | Status: AC
  Administered 2019-05-25: 150 mg via INTRAVENOUS

## 2019-05-25 MED ORDER — AMIODARONE HCL IN DEXTROSE 360-4.14 MG/200ML-% IV SOLN
30.0000 mg/h | INTRAVENOUS | Status: DC
  Administered 2019-05-25 (×2): 30 mg/h via INTRAVENOUS
  Filled 2019-05-25 (×2): qty 200

## 2019-05-25 MED ORDER — SODIUM CHLORIDE 0.9 % IV BOLUS
250.0000 mL | Freq: Once | INTRAVENOUS | Status: AC
  Administered 2019-05-25: 250 mL via INTRAVENOUS

## 2019-05-25 MED ORDER — AMIODARONE LOAD VIA INFUSION
150.0000 mg | Freq: Once | INTRAVENOUS | Status: AC
  Administered 2019-05-25: 150 mg via INTRAVENOUS
  Filled 2019-05-25: qty 83.34

## 2019-05-25 NOTE — Progress Notes (Signed)
Near syncopal OOB to [facility] with max assist x 1. Lifted back to bed with staff assist x 3. BP = 81/35 supine back in bed. Afib w/RVR and several runs of VT noted on monitor. RRT into room. Rosaria Ferries, PA notified of near syncopal event. Orders received.

## 2019-05-25 NOTE — Progress Notes (Signed)
Received a call from the nurse that the had problems with near syncope associated with nonsustained VT and rapid heart rate when he got up to the bedside commode.  Although he still in atrial fibrillation, his heart rate has improved and the VT has resolved now that he is back in bed.  Potassium was 3.9 this morning, he has had IV Lasix since then.  Recheck a BMET Magnesium was 1.8 this morning, will go ahead and give a gram of mag sulfate and recheck in a.m.  He has had 2 boluses of IV amiodarone and is currently on a drip, continue this  He may also have been orthostatic, once he feels better check orthostatics today and daily for now.  Continue to follow for symptoms.  Rosaria Ferries, PA-C 05/25/2019 3:21 PM

## 2019-05-25 NOTE — Progress Notes (Signed)
Resting in bed. Afib rate more controlled in 90's and low 100's. BP = 93/59.

## 2019-05-25 NOTE — Progress Notes (Signed)
Received call from [facility] stating that patient was in afib with rate in the 110s-120s, having frequent PVCs/runs of v-tach. Fransico Him, MD notified. Verbal orders received to start patient on amiodarone drip, discontinue PO amiodarone, and obtain for BMP and magnesium levels. Orders placed. Amiodarone drip started. Will continue to monitor this patient closely.  Elesa Hacker, RN

## 2019-05-25 NOTE — Progress Notes (Signed)
   05/25/19 0450  Vitals  BP (!) 78/56   Patient BP post amiodarone bolus 78/56 manually. Spoke with Dr. Radford Pax and received verbal orders to give a 250 NS bolus. BP increased to 112/94. Will continue to monitor patient closely.   Elesa Hacker, RN

## 2019-05-25 NOTE — Progress Notes (Signed)
Progress Note  Patient Name: Wesley Garcia Date of Encounter: 05/25/2019  Primary Cardiologist: Larae Grooms, MD   Subjective   Cardiology service assuming care from EP s/p dual chamber PPM  Spoke to patient's daughter in law [name] today about continued care issues and treatment plan.   Patient sleeping.   Inpatient Medications    Scheduled Meds: . furosemide  80 mg Intravenous BID  . insulin aspart  0-9 Units Subcutaneous TID WC  . insulin glargine  15 Units Subcutaneous Daily  . linagliptin  5 mg Oral Daily  . metoprolol tartrate  25 mg Oral BID  . multivitamin  1 tablet Oral Daily  . mupirocin ointment   Nasal BID  . pantoprazole  40 mg Oral BID AC  . potassium chloride  20 mEq Oral Daily  . sodium chloride flush  3 mL Intravenous Q12H  . sodium chloride flush  3 mL Intravenous Q12H  . tamsulosin  0.4 mg Oral QHS   Continuous Infusions: . sodium chloride    . amiodarone 60 mg/hr (05/25/19 0557)   Followed by  . amiodarone     PRN Meds: sodium chloride, acetaminophen, ALPRAZolam, nitroGLYCERIN, ondansetron (ZOFRAN) IV, sodium chloride flush, zolpidem   Vital Signs    Vitals:   05/25/19 0535 05/25/19 0604 05/25/19 0626 05/25/19 0757  BP: 135/85 117/87  (!) 148/83  Pulse: (!) 109 (!) 115 60   Resp:      Temp:    (!) 97.4 F (36.3 C)  TempSrc:    Oral  SpO2:  99% 94%   Weight:      Height:        Intake/Output Summary (Last 24 hours) at 05/25/2019 5956 Last data filed at 05/25/2019 0504 Gross per 24 hour  Intake 600 ml  Output 2850 ml  Net -2250 ml   Last 3 Weights 05/25/2019 05/23/2019 05/22/2019  Weight (lbs) 141 lb 15.6 oz 147 lb 0.8 oz 150 lb 11.2 oz  Weight (kg) 64.4 kg 66.7 kg 68.357 kg      Telemetry    afib with pvcs - Personally Reviewed  ECG    Afib with PVCs, QTc 539  I have personally reviewed   Physical Exam   Constitutional: No acute distress Eyes: sclera non-icteric, normal conjunctiva and lids ENMT: normal dentition,  moist mucous membranes Cardiovascular: irregular rhythm, normal rate, no murmurs. S1 and S2 normal. Radial pulses normal bilaterally. No jugular venous distention.  Respiratory: diminished in bases GI : normal bowel sounds, soft and nontender. No distention.   MSK: extremities warm, well perfused. 1+ edema.  NEURO: grossly nonfocal exam, moves all extremities. PSYCH: alert and oriented x 3, normal mood and affect.   Labs    High Sensitivity Troponin:   Recent Labs  Lab 05/20/19 1552 05/20/19 1955  TROPONINIHS 36* 34*      Chemistry Recent Labs  Lab 05/20/19 1955 05/21/19 0335 05/23/19 0229 05/24/19 0232 05/25/19 0600  NA  --    < > 137 137 134*  K  --    < > 3.8 3.7 3.9  CL  --    < > 100 99 97*  CO2  --    < > 26 25 25   GLUCOSE  --    < > 189* 78 225*  BUN  --    < > 24* 27* 31*  CREATININE  --    < > 2.34* 2.16* 2.26*  CALCIUM  --    < > 8.1* 8.5*  8.2*  PROT 6.0*  --   --   --   --   ALBUMIN 2.8*  --   --   --   --   AST 23  --   --   --   --   ALT 14  --   --   --   --   ALKPHOS 74  --   --   --   --   BILITOT 0.9  --   --   --   --   GFRNONAA  --    < > 24* 27* 25*  GFRAA  --    < > 28* 31* 29*  ANIONGAP  --    < > 11 13 12    < > = values in this interval not displayed.     Hematology Recent Labs  Lab 05/20/19 1552  WBC 8.1  RBC 4.97  HGB 14.4  HCT 46.0  MCV 92.6  MCH 29.0  MCHC 31.3  RDW 14.8  PLT 253    BNP Recent Labs  Lab 05/20/19 1955  BNP 1,671.4*     DDimer No results for input(s): DDIMER in the last 168 hours.   Radiology    DG Chest 2 View Result Date: 05/22/2019 CLINICAL DATA:  Post cardiac device insertion EXAM: CHEST - 2 VIEW COMPARISON:  05/20/2019 FINDINGS: New LEFT subclavian transvenous pacemaker with leads projecting at RIGHT atrium and RIGHT ventricle. Rotated to the LEFT. Borderline enlargement of cardiac silhouette. Mediastinal contours and pulmonary vascularity normal for technique. Bibasilar pleural effusions and  atelectasis. Upper lungs clear. No definite infiltrate or pneumothorax. Bones demineralized. IMPRESSION: No pneumothorax following pacemaker insertion. BILATERAL pleural effusions and bibasilar atelectasis. Electronically Signed   By: Lavonia Dana M.D.   On: 05/22/2019 08:26    Cardiac Studies   ECHO: 04/21/2019  1. Septal apical distal anterior wall mid and apical inferior wall  hypokinesis Basal function preserved Abnormal GLS -6. Left ventricular  ejection fraction, by estimation, is 25%. The left ventricle has severely  decreased function. The left ventricle  demonstrates regional wall motion abnormalities (see scoring  diagram/findings for description). The left ventricular internal cavity  size was moderately to severely dilated. Left ventricular diastolic  parameters are indeterminate.   2. Right ventricular systolic function is normal. The right ventricular  size is normal. There is moderately elevated pulmonary artery systolic  pressure.   3. Left atrial size was mildly dilated.   4. The mitral valve is normal in structure and function. Mild mitral  valve regurgitation. No evidence of mitral stenosis.   5. The aortic valve is tricuspid. Aortic valve regurgitation is trivial.  Mild aortic valve sclerosis is present, with no evidence of aortic valve  stenosis.   6. The inferior vena cava is dilated in size with >50% respiratory  variability, suggesting right atrial pressure of 8 mmHg.   Patient Profile     86 y.o. male with a history of CKD IV, DM, HTN, squamous cell carcinoma of hard palate, fall with hip fracture s/p surgical repair 02/27/19, and persistent atrial fibrillation dx 03/14/2019, on Xarelto for CHA2DS2-VASc of 4, s/p DCCV 04/07/2019, recurrent A. fib and started on amiodarone 04/14/2019, repeat cardioversion on 05/12/2019.He arrived to the Afib clinic with ERAF, rates 100's-110's and hypotensive with SBP 80's and referredto the ER. Initial plan was to pace and ablate. He was  quite symptomatic, weak, SOB an on occassion associated with CP. Received dual chamber PPM by [facility] on 05/21/19. Xarelto  on hold due to extensive pacemaker site ecchymosis.   Assessment & Plan   1.  Persistent atrial fibrillation, RVR     - He has been on amiodarone since 04/14/2019, but only stayed in sinus rhythm a couple of days after his recent cardioversion.    - back in afib, episode of RVR overnight, on amiodarone IV now      - S/p  attempt for CRT-P though no viable/acceptable veins and is s/p dual chamber PPM implant 3/17 He has had some oozing early, though no bleeding.  Skin is very thin and fragile. Hold xarelto for now given extensive ecchymosis at site, reviewed with EP, will hold until follow up, review then.  - continue metoprolol   2.  Pleural effusions 3.  Acute on chronic systolic CHF 4. CKD stage IV - lasix 40 IV mg on 3/17 and 3/18, none received yesterday - I/O 2.85 L output yesterday, net - 5.6 L for the admission.  - Cr baseline is approximately 1.8-1.9, 2.34>>2.16>>2.26  - CXR yesterday showed continued bilateral pleural effusions. Will continue IV diuresis, 80 mg lasix BID, and monitor renal function and output.     5.  Diabetes: - Continue same, appreciate DM coordinator recs  6.  Hypertension - mild hypotension, monitor.   7.  NSVT     None further  8. Functional decline     PT recs noted for SNF     Case management aware     Continue with PT while here - consider SNF dismissal Monday-Tuesday after better aggressive diuresis.  Plan follow up with Dr. Irish Lack in 2-4 weeks.   For questions or updates, please contact [place] Please consult [email or web address] for contact info under      Signed, Elouise Munroe, MD  05/25/2019, 8:22 AM

## 2019-05-25 NOTE — TOC Progression Note (Signed)
Transition of Care [facility]) - Progression Note    Patient Details  Name: Wesley Garcia MRN: [id number] Date of Birth: 12-Mar-1929  Transition of Care [facility]) CM/SW [place], [place] Phone Number: [phone] 05/25/2019, 9:18 AM  Clinical Narrative:     CSW was alerted by RN that patient was not medically stable for discharge today. CSW reached out to the [facility] to alert them that patient was not coming.  TOC team will continue to monitor for discharge planning needs.   Expected Discharge Plan: Skilled Nursing Facility Barriers to Discharge: Insurance Authorization  Expected Discharge Plan and Services Expected Discharge Plan: [place] In-house Referral: Clinical Social Work   Post Acute Care Choice: [place] Living arrangements for the past 2 months: Single Family Home                                       Social Determinants of Health (SDOH) Interventions    Readmission Risk Interventions No flowsheet data found.

## 2019-05-25 NOTE — Progress Notes (Signed)
Unable to complete orthostatic standing bp d/t weakness and pt now on bedrest.

## 2019-05-25 NOTE — Progress Notes (Signed)
Progress Note  Patient Name: Wesley Garcia Date of Encounter: 05/25/2019  Primary Cardiologist: Larae Grooms, MD   Subjective   " I had a rough night last night."  Inpatient Medications    Scheduled Meds: . insulin aspart  0-9 Units Subcutaneous TID WC  . insulin glargine  15 Units Subcutaneous Daily  . linagliptin  5 mg Oral Daily  . metoprolol tartrate  25 mg Oral BID  . multivitamin  1 tablet Oral Daily  . mupirocin ointment   Nasal BID  . pantoprazole  40 mg Oral BID AC  . potassium chloride  20 mEq Oral Daily  . sodium chloride flush  3 mL Intravenous Q12H  . sodium chloride flush  3 mL Intravenous Q12H  . tamsulosin  0.4 mg Oral QHS   Continuous Infusions: . sodium chloride    . amiodarone 60 mg/hr (05/25/19 0557)   Followed by  . amiodarone     PRN Meds: sodium chloride, acetaminophen, ALPRAZolam, nitroGLYCERIN, ondansetron (ZOFRAN) IV, sodium chloride flush, zolpidem   Vital Signs    Vitals:   05/25/19 0749 05/25/19 0757 05/25/19 0805 05/25/19 0834  BP: (!) 148/83 (!) 148/83 125/74 126/74  Pulse: (!) 108  (!) 110 (!) 102  Resp:      Temp:  (!) 97.4 F (36.3 C)    TempSrc:  Oral    SpO2: 96%  (!) 88% 97%  Weight:      Height:        Intake/Output Summary (Last 24 hours) at 05/25/2019 0938 Last data filed at 05/25/2019 0504 Gross per 24 hour  Intake 480 ml  Output 2850 ml  Net -2370 ml   Filed Weights   05/22/19 0605 05/23/19 0518 05/25/19 0351  Weight: 68.4 kg 66.7 kg 64.4 kg    Telemetry    Atrial fib with NSVT - Personally Reviewed  ECG    none - Personally Reviewed  Physical Exam   GEN: No acute distress.   Neck: No JVD Cardiac: RRR, no murmurs, rubs, or gallops.  Respiratory: Clear to auscultation bilaterally. Large area of ecchymosis GI: Soft, nontender, non-distended  MS: No edema; No deformity. Neuro:  Nonfocal  Psych: Normal affect   Labs    Chemistry Recent Labs  Lab 05/20/19 1955 05/21/19 0335 05/23/19 0229  05/24/19 0232 05/25/19 0600  NA  --    < > 137 137 134*  K  --    < > 3.8 3.7 3.9  CL  --    < > 100 99 97*  CO2  --    < > 26 25 25   GLUCOSE  --    < > 189* 78 225*  BUN  --    < > 24* 27* 31*  CREATININE  --    < > 2.34* 2.16* 2.26*  CALCIUM  --    < > 8.1* 8.5* 8.2*  PROT 6.0*  --   --   --   --   ALBUMIN 2.8*  --   --   --   --   AST 23  --   --   --   --   ALT 14  --   --   --   --   ALKPHOS 74  --   --   --   --   BILITOT 0.9  --   --   --   --   GFRNONAA  --    < > 24* 27* 25*  GFRAA  --    < >  28* 31* 29*  ANIONGAP  --    < > 11 13 12    < > = values in this interval not displayed.     Hematology Recent Labs  Lab 05/20/19 1552  WBC 8.1  RBC 4.97  HGB 14.4  HCT 46.0  MCV 92.6  MCH 29.0  MCHC 31.3  RDW 14.8  PLT 253    Cardiac EnzymesNo results for input(s): TROPONINI in the last 168 hours. No results for input(s): TROPIPOC in the last 168 hours.   BNP Recent Labs  Lab 05/20/19 1955  BNP 1,671.4*     DDimer No results for input(s): DDIMER in the last 168 hours.   Radiology    DG Chest 2 View  Result Date: 05/24/2019 CLINICAL DATA:  Pleural effusion. EXAM: CHEST - 2 VIEW COMPARISON:  Chest radiograph 05/22/2019 FINDINGS: Redemonstrated left chest dual lead AICD. Overlying cardiac monitoring leads. Borderline enlarged heart size, unchanged. Aortic atherosclerosis. Unchanged bilateral pleural effusions and associated bibasilar atelectasis. The upper lungs remain clear. No evidence of pneumothorax. No acute bony abnormality IMPRESSION: Stable examination as compared to 05/22/2019. Unchanged bilateral pleural effusions and associated bibasilar atelectasis. Electronically Signed   By: Kellie Simmering DO   On: 05/24/2019 09:41    Cardiac Studies   none  Patient Profile     86 y.o. male admitted with symptomatic tachy/brady and CHF, s/p PPM insertion, with significant site bleeding, now with recurrent uncontrolled atrial fib  Assessment & Plan    1. Atrial  fib - he is back out of rhythm and has been restarted on IV amiodarone. Continue.  2. QT prolongation - this appears a bit better today. We will follow.  3. Postoperative bleeding - avoid xarelto or heparin 4. Chronic systolic heart failure - this will be his biggest challenge particularly in the setting of his worsening renal dysfunction. He has been switched to oral lasix. Might need toresemide.    For questions or updates, please contact [place] Please consult [email or web address] for contact info under Cardiology/STEMI.      Signed, Cristopher Peru, MD  05/25/2019, 9:38 AM  Patient ID: Wesley Garcia, male   DOB: 19-Feb-1930, 86 y.o.   MRN: [id number]

## 2019-05-25 NOTE — Significant Event (Addendum)
Rapid Response Event Note  Overview: Time Called: 1450 Arrival Time: 1450 Event Type: Other (Comment)(Near syncopal event.)  Initial Focused Assessment: Pt up to [facility] with staff member to have bowel movement. Pt started not feeling well and began to slump to side. Staff members x3 helped pt back to bed. Initial BP 81/35, cuff replaced and rechecked with a BP of 106/75. Pt had run of V-tach during event, noted on telemetry monitor. Pt currently on Amiodarone gtt. Once back to bed, pt was awake and alert. Pt denied chest pain, pain, dizziness, or lightheadedness. Heart rate and BP improved once back to bed.  Interventions: -Primary RN spoke with R. Barrett, PA over the phone and received orders for: 1g IV Mg, orthostatic vital signs, and BMP/Mg.   Plan of Care (if not transferred): -Document orthostatic vital signs in flowsheet -Bedrest until orthostatic VS have been assessed -2 staff member assist, high fall risk  Call rapid response for further needs.  Event Summary: Name of Physician Notified: R. Barrett, PA at 1450 Outcome: Stayed in room and stabalized Event End Time: [place]

## 2019-05-26 ENCOUNTER — Ambulatory Visit: Payer: Medicare PPO | Admitting: Internal Medicine

## 2019-05-26 ENCOUNTER — Ambulatory Visit (HOSPITAL_COMMUNITY): Payer: Medicare PPO | Admitting: Physician Assistant

## 2019-05-26 ENCOUNTER — Inpatient Hospital Stay (HOSPITAL_COMMUNITY): Payer: Medicare PPO

## 2019-05-26 DIAGNOSIS — M7989 Other specified soft tissue disorders: Secondary | ICD-10-CM

## 2019-05-26 DIAGNOSIS — N184 Chronic kidney disease, stage 4 (severe): Secondary | ICD-10-CM

## 2019-05-26 LAB — BASIC METABOLIC PANEL
Anion gap: 12 (ref 5–15)
BUN: 33 mg/dL — ABNORMAL HIGH (ref 8–23)
CO2: 28 mmol/L (ref 22–32)
Calcium: 8.5 mg/dL — ABNORMAL LOW (ref 8.9–10.3)
Chloride: 94 mmol/L — ABNORMAL LOW (ref 98–111)
Creatinine, Ser: 2.37 mg/dL — ABNORMAL HIGH (ref 0.61–1.24)
GFR calc Af Amer: 28 mL/min — ABNORMAL LOW (ref >60)
GFR calc non Af Amer: 24 mL/min — ABNORMAL LOW (ref >60)
Glucose, Bld: 122 mg/dL — ABNORMAL HIGH (ref 70–99)
Potassium: 5.3 mmol/L — ABNORMAL HIGH (ref 3.5–5.1)
Sodium: 134 mmol/L — ABNORMAL LOW (ref 135–145)

## 2019-05-26 LAB — GLUCOSE, CAPILLARY
Glucose-Capillary: 141 mg/dL — ABNORMAL HIGH (ref 70–99)
Glucose-Capillary: 140 mg/dL — ABNORMAL HIGH (ref 70–99)
Glucose-Capillary: 138 mg/dL — ABNORMAL HIGH (ref 70–99)

## 2019-05-26 LAB — MAGNESIUM: Magnesium: 2.1 mg/dL (ref 1.7–2.4)

## 2019-05-26 MED ORDER — OXYCODONE-ACETAMINOPHEN 5-325 MG PO TABS
1.0000 | ORAL_TABLET | Freq: Four times a day (QID) | ORAL | Status: DC | PRN
  Administered 2019-05-27 – 2019-05-28 (×2): 1 via ORAL
  Filled 2019-05-26 (×2): qty 1

## 2019-05-26 MED ORDER — FUROSEMIDE 10 MG/ML IJ SOLN
80.0000 mg | Freq: Two times a day (BID) | INTRAMUSCULAR | Status: AC
  Administered 2019-05-26: 80 mg via INTRAVENOUS
  Filled 2019-05-26: qty 8

## 2019-05-26 MED ORDER — AMIODARONE HCL 200 MG PO TABS
200.0000 mg | ORAL_TABLET | Freq: Every day | ORAL | Status: DC
  Administered 2019-05-26 – 2019-05-28 (×3): 200 mg via ORAL
  Filled 2019-05-26 (×3): qty 1

## 2019-05-26 NOTE — Progress Notes (Signed)
Progress Note  Patient Name: Wesley Garcia Date of Encounter: 05/26/2019  Primary Cardiologist: Larae Grooms, MD   Subjective   Cardiology service assuming care from EP s/p dual chamber PPM  Back in sinus rhythm, rate 70. Patient is tired, breathing stable.   Inpatient Medications    Scheduled Meds: . amiodarone  200 mg Oral Daily  . furosemide  80 mg Intravenous BID  . furosemide  80 mg Intravenous BID  . insulin aspart  0-9 Units Subcutaneous TID WC  . insulin glargine  15 Units Subcutaneous Daily  . linagliptin  5 mg Oral Daily  . metoprolol tartrate  25 mg Oral BID  . multivitamin  1 tablet Oral Daily  . mupirocin ointment   Nasal BID  . pantoprazole  40 mg Oral BID AC  . potassium chloride  20 mEq Oral Daily  . sodium chloride flush  3 mL Intravenous Q12H  . sodium chloride flush  3 mL Intravenous Q12H  . tamsulosin  0.4 mg Oral QHS   Continuous Infusions: . sodium chloride     PRN Meds: sodium chloride, acetaminophen, ALPRAZolam, nitroGLYCERIN, ondansetron (ZOFRAN) IV, oxyCODONE-acetaminophen, sodium chloride flush   Vital Signs    Vitals:   05/25/19 2034 05/25/19 2144 05/26/19 0333 05/26/19 0635  BP:  (!) 155/67 114/83 (!) 156/133  Pulse: 89 94 94 74  Resp: 19  18   Temp: 98.2 F (36.8 C)  97.7 F (36.5 C)   TempSrc: Oral  Oral   SpO2:    90%  Weight:   66.7 kg   Height:        Intake/Output Summary (Last 24 hours) at 05/26/2019 0909 Last data filed at 05/26/2019 0336 Gross per 24 hour  Intake 1430.39 ml  Output 1625 ml  Net -194.61 ml   Last 3 Weights 05/26/2019 05/25/2019 05/23/2019  Weight (lbs) 147 lb 0.8 oz 141 lb 15.6 oz 147 lb 0.8 oz  Weight (kg) 66.7 kg 64.4 kg 66.7 kg      Telemetry    Sinus, 70s - Personally Reviewed  ECG    Repeat pending   Physical Exam   Constitutional: No acute distress Cardiovascular: regular rhythm, normal rate, no murmurs. S1 and S2 normal.  No jugular venous distention. PPM site ecchymosis and  swelling improving. Respiratory: diminished in bases GI : normal bowel sounds, soft and nontender. No distention.   MSK: extremities warm, well perfused. Trace edema.  NEURO: grossly nonfocal exam, moves all extremities. PSYCH: alert and oriented x 3, normal mood and affect.   Labs    High Sensitivity Troponin:   Recent Labs  Lab 05/20/19 1552 05/20/19 1955  TROPONINIHS 36* 34*      Chemistry Recent Labs  Lab 05/20/19 1955 05/21/19 0335 05/25/19 0600 05/25/19 1548 05/26/19 0413  NA  --    < > 134* 133* 134*  K  --    < > 3.9 4.3 5.3*  CL  --    < > 97* 96* 94*  CO2  --    < > 25 22 28   GLUCOSE  --    < > 225* 169* 122*  BUN  --    < > 31* 34* 33*  CREATININE  --    < > 2.26* 2.32* 2.37*  CALCIUM  --    < > 8.2* 8.2* 8.5*  PROT 6.0*  --   --   --   --   ALBUMIN 2.8*  --   --   --   --  AST 23  --   --   --   --   ALT 14  --   --   --   --   ALKPHOS 74  --   --   --   --   BILITOT 0.9  --   --   --   --   GFRNONAA  --    < > 25* 24* 24*  GFRAA  --    < > 29* 28* 28*  ANIONGAP  --    < > 12 15 12    < > = values in this interval not displayed.     Hematology Recent Labs  Lab 05/20/19 1552  WBC 8.1  RBC 4.97  HGB 14.4  HCT 46.0  MCV 92.6  MCH 29.0  MCHC 31.3  RDW 14.8  PLT 253    BNP Recent Labs  Lab 05/20/19 1955  BNP 1,671.4*     DDimer No results for input(s): DDIMER in the last 168 hours.   Radiology    DG Chest 2 View Result Date: 05/22/2019 CLINICAL DATA:  Post cardiac device insertion EXAM: CHEST - 2 VIEW COMPARISON:  05/20/2019 FINDINGS: New LEFT subclavian transvenous pacemaker with leads projecting at RIGHT atrium and RIGHT ventricle. Rotated to the LEFT. Borderline enlargement of cardiac silhouette. Mediastinal contours and pulmonary vascularity normal for technique. Bibasilar pleural effusions and atelectasis. Upper lungs clear. No definite infiltrate or pneumothorax. Bones demineralized. IMPRESSION: No pneumothorax following pacemaker  insertion. BILATERAL pleural effusions and bibasilar atelectasis. Electronically Signed   By: Lavonia Dana M.D.   On: 05/22/2019 08:26    Cardiac Studies   ECHO: 04/21/2019  1. Septal apical distal anterior wall mid and apical inferior wall  hypokinesis Basal function preserved Abnormal GLS -6. Left ventricular  ejection fraction, by estimation, is 25%. The left ventricle has severely  decreased function. The left ventricle  demonstrates regional wall motion abnormalities (see scoring  diagram/findings for description). The left ventricular internal cavity  size was moderately to severely dilated. Left ventricular diastolic  parameters are indeterminate.   2. Right ventricular systolic function is normal. The right ventricular  size is normal. There is moderately elevated pulmonary artery systolic  pressure.   3. Left atrial size was mildly dilated.   4. The mitral valve is normal in structure and function. Mild mitral  valve regurgitation. No evidence of mitral stenosis.   5. The aortic valve is tricuspid. Aortic valve regurgitation is trivial.  Mild aortic valve sclerosis is present, with no evidence of aortic valve  stenosis.   6. The inferior vena cava is dilated in size with >50% respiratory  variability, suggesting right atrial pressure of 8 mmHg.   Patient Profile     86 y.o. male with a history of CKD IV, DM, HTN, squamous cell carcinoma of hard palate, fall with hip fracture s/p surgical repair 02/27/19, and persistent atrial fibrillation dx 03/14/2019, on Xarelto for CHA2DS2-VASc of 4, s/p DCCV 04/07/2019, recurrent A. fib and started on amiodarone 04/14/2019, repeat cardioversion on 05/12/2019.He arrived to the Afib clinic with ERAF, rates 100's-110's and hypotensive with SBP 80's and referredto the ER. Initial plan was to pace and ablate. He was quite symptomatic, weak, SOB an on occassion associated with CP. Received dual chamber PPM by [facility] on 05/21/19. Xarelto on hold due to  extensive pacemaker site ecchymosis.   Assessment & Plan   1.  Persistent atrial fibrillation, RVR     - He has been on amiodarone since  04/14/2019, but only stayed in sinus rhythm a couple of days after his recent cardioversion.    - went back in afib, episode of RVR overnight on 3/20, started amiodarone IV, now back in sinus.       - S/p  attempt for CRT-P though no viable/acceptable veins and is s/p dual chamber PPM implant 3/17 He has had some oozing early, though no bleeding.  Hold xarelto for now given extensive ecchymosis at site, reviewed with EP, will hold until follow up, review then.  - continue metoprolol - will d/c iv amiodarone, and restart home amio 200 mg po daily   2.  Pleural effusions 3.  Acute on chronic systolic CHF 4. CKD stage IV - I/O 1.625 L output yesterday, net - 5.55  L for the admission.  - Cr baseline is approximately 1.8-1.9, 2.34>>2.16>>2.26 >> 2.37 - CXR showed continued bilateral pleural effusions. Will continue IV diuresis, 80 mg lasix BID, and monitor renal function and output.   - will likely transition to oral diuresis tomorrow.    5.  Diabetes: - Continue same, appreciate DM coordinator recs  6.  Hypertension - mild hypotension, monitor.   7.  NSVT - had episodes yesterday, keep K and Mg replete  8. Functional decline     PT recs noted for SNF     Case management aware     Continue with PT while here - consider SNF dismissal Monday-Tuesday after better aggressive diuresis.  Plan follow up with Dr. Irish Lack in 2-4 weeks.   For questions or updates, please contact [place] Please consult [email or web address] for contact info under      Signed, Elouise Munroe, MD  05/26/2019, 9:09 AM

## 2019-05-26 NOTE — Progress Notes (Signed)
Physical Therapy Treatment Patient Details Name: Wesley Garcia MRN: [id number] DOB: February 06, 1930 Today's Date: 05/26/2019    History of Present Illness Pt is an 86 y/o male admitted secondary to increased SOB and weakness. Found to have a fib and is s/p pacemaker insertion. PMH includes a fib, DM, and HTN.     PT Comments    Pt was seen for mobility of legs, unable to tolerate sitting today due to fatigue and medical changes.  Was able to reposition using bed pad under his shoulders, and had inserted his dentures with some discomfort as he reports having some gum pain.  Talked with nursing about pt request to have something to numb the spot that hurts.  Follow acutely for progression of mobility with maintenance on NWB of LUE due to ongoing bruising and issues of pacemaker site since implant done.     Follow Up Recommendations  SNF;Supervision/Assistance - 24 hour     Equipment Recommendations  None recommended by PT    Recommendations for Other Services       Precautions / Restrictions Precautions Precautions: Fall;ICD/Pacemaker Precaution Comments: Pacemaker precautions Restrictions Weight Bearing Restrictions: Yes LUE Weight Bearing: Non weight bearing Other Position/Activity Restrictions: pacemaker precautions    Mobility  Bed Mobility Overal bed mobility: Needs Assistance Bed Mobility: (scooting)     Supine to sit: (scooting max assist on bed pad)        Transfers                 General transfer comment: deferred  Ambulation/Gait                 Stairs             Wheelchair Mobility    Modified Rankin (Stroke Patients Only)       Balance                                            Cognition Arousal/Alertness: Awake/alert Behavior During Therapy: Restless Overall Cognitive Status: No family/caregiver present to determine baseline cognitive functioning                                 General  Comments: pt is not aware of his limits or precautions      Exercises General Exercises - Lower Extremity Ankle Circles/Pumps: AAROM;10 reps Heel Slides: AAROM;10 reps Hip ABduction/ADduction: AAROM;10 reps Straight Leg Raises: AAROM;10 reps    General Comments General comments (skin integrity, edema, etc.): Confirmed with nursing that pt is still NWB on LUE due to his bleeding post op      Pertinent Vitals/Pain Pain Assessment: Faces Faces Pain Scale: Hurts little more Pain Location: mouth with insertion of dentures Pain Descriptors / Indicators: Tender Pain Intervention(s): Other (comment)(asked nursing for pt about numbing meds )    Home Living                      Prior Function            PT Goals (current goals can now be found in the care plan section) Acute Rehab PT Goals Patient Stated Goal: to feel better    Frequency    Min 2X/week      PT Plan Current plan remains appropriate    Co-evaluation  AM-PAC PT "6 Clicks" Mobility   Outcome Measure  Help needed turning from your back to your side while in a flat bed without using bedrails?: A Lot Help needed moving from lying on your back to sitting on the side of a flat bed without using bedrails?: A Lot Help needed moving to and from a bed to a chair (including a wheelchair)?: Total Help needed standing up from a chair using your arms (e.g., wheelchair or bedside chair)?: Total Help needed to walk in hospital room?: Total Help needed climbing 3-5 steps with a railing? : Total 6 Click Score: 8    End of Session   Activity Tolerance: Patient limited by fatigue;Treatment limited secondary to medical complications (Comment) Patient left: in bed;with call bell/phone within reach;with bed alarm set Nurse Communication: Mobility status PT Visit Diagnosis: Unsteadiness on feet (R26.81);Muscle weakness (generalized) (M62.81);History of falling (Z91.81)     Time: 1538-1600 PT Time  Calculation (min) (ACUTE ONLY): 22 min  Charges:  $Therapeutic Exercise: 8-22 mins                   Ramond Dial 05/26/2019, 4:16 PM  Mee Hives, PT MS Acute Rehab Dept. Number: [place] and [place]

## 2019-05-26 NOTE — Progress Notes (Signed)
Upper extremity venous has been completed.   Preliminary results in CV Proc.   Abram Sander 05/26/2019 2:00 PM

## 2019-05-26 NOTE — Progress Notes (Signed)
PT Cancellation Note  Patient Details Name: Wesley Garcia MRN: [id number] DOB: May 02, 1929   Cancelled Treatment:    Reason Eval/Treat Not Completed: Medical issues which prohibited therapy.  Monitoring his diastolic elevation, will reattempt as time and pt allow.   Ramond Dial 05/26/2019, 10:42 AM   Mee Hives, PT MS Acute Rehab Dept. Number: [place] and [place]

## 2019-05-26 NOTE — Telephone Encounter (Signed)
Called and made daughter in law [name] and the patient aware that we will let EP decide if an when to reschedule the echo. Order has been deleted at this time.

## 2019-05-26 NOTE — Telephone Encounter (Signed)
OK to defer to EP.  JV

## 2019-05-26 NOTE — Care Management Important Message (Signed)
Important Message  Patient Details  Name: Wesley Garcia MRN: [id number] Date of Birth: 17-May-1929   Medicare Important Message Given:  Yes     Shelda Altes 05/26/2019, 11:01 AM

## 2019-05-26 NOTE — Telephone Encounter (Signed)
Patient was admitted to the hospital when he was seen in the Afib clinic after his DCCV because he was back in Afib RVR and was hypotensive with SBP in the 80s. Initial plan was to pace and ablate, however, since he converted to SB they decided to wait on ablation and ended up placing a PPM. The patient called to cancel his echocardiogram since he was in the hospital. Will forward to Dr. Irish Lack to see if he wants to reschedule at a later time or if he will defer to EP.

## 2019-05-27 ENCOUNTER — Ambulatory Visit: Payer: Medicare PPO

## 2019-05-27 LAB — BASIC METABOLIC PANEL
Anion gap: 9 (ref 5–15)
BUN: 40 mg/dL — ABNORMAL HIGH (ref 8–23)
CO2: 31 mmol/L (ref 22–32)
Calcium: 8.4 mg/dL — ABNORMAL LOW (ref 8.9–10.3)
Chloride: 94 mmol/L — ABNORMAL LOW (ref 98–111)
Creatinine, Ser: 2.92 mg/dL — ABNORMAL HIGH (ref 0.61–1.24)
GFR calc Af Amer: 21 mL/min — ABNORMAL LOW (ref >60)
GFR calc non Af Amer: 18 mL/min — ABNORMAL LOW (ref >60)
Glucose, Bld: 162 mg/dL — ABNORMAL HIGH (ref 70–99)
Potassium: 3.9 mmol/L (ref 3.5–5.1)
Sodium: 134 mmol/L — ABNORMAL LOW (ref 135–145)

## 2019-05-27 LAB — GLUCOSE, CAPILLARY
Glucose-Capillary: 145 mg/dL — ABNORMAL HIGH (ref 70–99)
Glucose-Capillary: 217 mg/dL — ABNORMAL HIGH (ref 70–99)
Glucose-Capillary: 195 mg/dL — ABNORMAL HIGH (ref 70–99)
Glucose-Capillary: 193 mg/dL — ABNORMAL HIGH (ref 70–99)

## 2019-05-27 NOTE — Progress Notes (Signed)
Progress Note  Patient Name: Wesley Garcia Date of Encounter: 05/27/2019  Primary Cardiologist: Larae Grooms, MD   Subjective   In atrial fibrillation again this morning. Cr 2.92 after aggressive diuresis.  Inpatient Medications    Scheduled Meds: . amiodarone  200 mg Oral Daily  . insulin aspart  0-9 Units Subcutaneous TID WC  . insulin glargine  15 Units Subcutaneous Daily  . linagliptin  5 mg Oral Daily  . metoprolol tartrate  25 mg Oral BID  . multivitamin  1 tablet Oral Daily  . mupirocin ointment   Nasal BID  . pantoprazole  40 mg Oral BID AC  . potassium chloride  20 mEq Oral Daily  . sodium chloride flush  3 mL Intravenous Q12H  . sodium chloride flush  3 mL Intravenous Q12H  . tamsulosin  0.4 mg Oral QHS   Continuous Infusions: . sodium chloride     PRN Meds: sodium chloride, acetaminophen, ALPRAZolam, nitroGLYCERIN, ondansetron (ZOFRAN) IV, oxyCODONE-acetaminophen, sodium chloride flush   Vital Signs    Vitals:   05/26/19 1747 05/26/19 2024 05/26/19 2110 05/27/19 0458  BP: (!) 109/53 (!) 101/31  92/66  Pulse:  60  99  Resp:  19  18  Temp: 98.1 F (36.7 C) 97.9 F (36.6 C) 97.9 F (36.6 C) 98.8 F (37.1 C)  TempSrc: Oral Oral Oral Oral  SpO2:  96%  98%  Weight:    62.9 kg  Height:        Intake/Output Summary (Last 24 hours) at 05/27/2019 1027 Last data filed at 05/27/2019 0502 Gross per 24 hour  Intake 360 ml  Output 1900 ml  Net -1540 ml   Last 3 Weights 05/27/2019 05/26/2019 05/25/2019  Weight (lbs) 138 lb 10.7 oz 147 lb 0.8 oz 141 lb 15.6 oz  Weight (kg) 62.9 kg 66.7 kg 64.4 kg      Telemetry    Afib rate 100-110- Personally Reviewed  ECG    Normal sinus rhythm, Anterior infarct , age undetermined, Prolonged QT 523 ms - personally reviewed  Physical Exam   Constitutional: No acute distress Eyes: sclera non-icteric, normal conjunctiva and lids ENMT: dentures, moist mucous membranes Cardiovascular: irregular rhythm, normal rate  S1 and S2 normal. No JVD. L chest wall pacemaker has resolving ecchymosis, and decreased swelling compared to prior days. Respiratory: diminished in bilateral bases. GI : normal bowel sounds, soft and nontender. No distention.   MSK: extremities warm, well perfused. Trace bilateral edema.  NEURO: grossly nonfocal exam, moves all extremities. PSYCH: alert and oriented x 3, normal mood and affect.   Labs    High Sensitivity Troponin:   Recent Labs  Lab 05/20/19 1552 05/20/19 1955  TROPONINIHS 36* 34*      Chemistry Recent Labs  Lab 05/20/19 1955 05/21/19 0335 05/25/19 1548 05/26/19 0413 05/27/19 0313  NA  --    < > 133* 134* 134*  K  --    < > 4.3 5.3* 3.9  CL  --    < > 96* 94* 94*  CO2  --    < > 22 28 31   GLUCOSE  --    < > 169* 122* 162*  BUN  --    < > 34* 33* 40*  CREATININE  --    < > 2.32* 2.37* 2.92*  CALCIUM  --    < > 8.2* 8.5* 8.4*  PROT 6.0*  --   --   --   --   ALBUMIN 2.8*  --   --   --   --  AST 23  --   --   --   --   ALT 14  --   --   --   --   ALKPHOS 74  --   --   --   --   BILITOT 0.9  --   --   --   --   GFRNONAA  --    < > 24* 24* 18*  GFRAA  --    < > 28* 28* 21*  ANIONGAP  --    < > 15 12 9    < > = values in this interval not displayed.     Hematology Recent Labs  Lab 05/20/19 1552  WBC 8.1  RBC 4.97  HGB 14.4  HCT 46.0  MCV 92.6  MCH 29.0  MCHC 31.3  RDW 14.8  PLT 253    BNP Recent Labs  Lab 05/20/19 1955  BNP 1,671.4*     DDimer No results for input(s): DDIMER in the last 168 hours.   Radiology    DG Chest 2 View Result Date: 05/22/2019 CLINICAL DATA:  Post cardiac device insertion EXAM: CHEST - 2 VIEW COMPARISON:  05/20/2019 FINDINGS: New LEFT subclavian transvenous pacemaker with leads projecting at RIGHT atrium and RIGHT ventricle. Rotated to the LEFT. Borderline enlargement of cardiac silhouette. Mediastinal contours and pulmonary vascularity normal for technique. Bibasilar pleural effusions and atelectasis. Upper  lungs clear. No definite infiltrate or pneumothorax. Bones demineralized. IMPRESSION: No pneumothorax following pacemaker insertion. BILATERAL pleural effusions and bibasilar atelectasis. Electronically Signed   By: Lavonia Dana M.D.   On: 05/22/2019 08:26    Cardiac Studies   ECHO: 04/21/2019  1. Septal apical distal anterior wall mid and apical inferior wall  hypokinesis Basal function preserved Abnormal GLS -6. Left ventricular  ejection fraction, by estimation, is 25%. The left ventricle has severely  decreased function. The left ventricle  demonstrates regional wall motion abnormalities (see scoring  diagram/findings for description). The left ventricular internal cavity  size was moderately to severely dilated. Left ventricular diastolic  parameters are indeterminate.   2. Right ventricular systolic function is normal. The right ventricular  size is normal. There is moderately elevated pulmonary artery systolic  pressure.   3. Left atrial size was mildly dilated.   4. The mitral valve is normal in structure and function. Mild mitral  valve regurgitation. No evidence of mitral stenosis.   5. The aortic valve is tricuspid. Aortic valve regurgitation is trivial.  Mild aortic valve sclerosis is present, with no evidence of aortic valve  stenosis.   6. The inferior vena cava is dilated in size with >50% respiratory  variability, suggesting right atrial pressure of 8 mmHg.   Patient Profile     86 y.o. male with a history of CKD IV, DM, HTN, squamous cell carcinoma of hard palate, fall with hip fracture s/p surgical repair 02/27/19, and persistent atrial fibrillation dx 03/14/2019, on Xarelto for CHA2DS2-VASc of 4, s/p DCCV 04/07/2019, recurrent A. fib and started on amiodarone 04/14/2019, repeat cardioversion on 05/12/2019.He arrived to the Afib clinic with ERAF, rates 100's-110's and hypotensive with SBP 80's and referredto the ER. Initial plan was to pace and ablate. He was quite symptomatic,  weak, SOB an on occassion associated with CP. Received dual chamber PPM by [facility] on 05/21/19. Xarelto on hold due to extensive pacemaker site ecchymosis.   Assessment & Plan   1.  Persistent atrial fibrillation, RVR - S/p  attempt for CRT-P though no viable/acceptable veins and  is s/p dual chamber PPM implant 3/17. He has had some oozing early, though no bleeding. Large ecchymosis at the pacer site. Hold xarelto given extensive ecchymosis at site, reviewed with EP, will hold until follow up.  - continue metoprolol - on amio 200 mg po daily. Back in atrial fibrillation today. Discussed with EP, continue oral amio.    2.  Pleural effusions 3.  Acute on chronic systolic CHF 4. CKD stage IV - I/O 1.9 L output yesterday, net -  6.85  L for the admission.  - Cr baseline is approximately 1.8-1.9, 2.34>>2.16>>2.26 >> 2.37>>2.92 - aggressively diuresed. Creatinine increased. Will hold diuresis today and restart oral tomorrow.    5.  Diabetes: - Continue same, appreciate DM coordinator recs  6.  Hypertension - mild hypotension, monitor.   7.  NSVT - brief episodes, keep K and Mg replete  8. Functional decline     PT recs noted for SNF     Case management aware     Continue with PT while here - consider SNF dismissal Wednesday/Thursday if clinically stable.  Plan follow up with Dr. Irish Lack in 2-4 weeks.   For questions or updates, please contact [place] Please consult [email or web address] for contact info under   I spent >45 minutes on patient care on the date of this encounter discussing with Mickel Baas his nurse, EP, and reviewing imaging and hemodynamic data, and discussing with patient's family [name] and patient's wife.      Signed, Elouise Munroe, MD  05/27/2019, 10:27 AM

## 2019-05-27 NOTE — Progress Notes (Signed)
   Pt continues to have intermittent episodes of AF with rates 100-120s at times.  Remains on amiodarone 200 mg daily. QTc prolonged at higher doses. (and borderline on 200 mg daily). With reduced EF, he is also not a candidate for other AADs at this time.   He has been over diuresed with AKI and lightheadedness, and now on hold to watch Cr come back down.   At this point, would continue po amiodarone 200 mg daily and Lopressor 25 mg BID. Would further address AF as outpatient once stable for discharge otherwise.   Holding Xarelto for now with extensive ecchymosis at site. Current plan is to hold until wound check, at least.   I discussed with Dr. Lovena Le today, and will further discuss with Dr. Curt Bears who follows him, tomorrow am.   Beryle Beams" [place], [place]  05/27/2019 11:48 AM

## 2019-05-27 NOTE — Progress Notes (Signed)
Pt converted back into afib- pt asymptomatic will continue to monitor

## 2019-05-28 ENCOUNTER — Encounter (HOSPITAL_COMMUNITY)
Admission: EM | Disposition: A | Discharge status: Home / Self Care | Payer: Self-pay | Source: Home / Self Care | Attending: Interventional Cardiology

## 2019-05-28 DIAGNOSIS — I5022 Chronic systolic (congestive) heart failure: Secondary | ICD-10-CM

## 2019-05-28 DIAGNOSIS — I428 Other cardiomyopathies: Secondary | ICD-10-CM

## 2019-05-28 DIAGNOSIS — I10 Essential (primary) hypertension: Secondary | ICD-10-CM

## 2019-05-28 HISTORY — PX: AV NODE ABLATION: EP1193

## 2019-05-28 LAB — CBC
HCT: 35.3 % — ABNORMAL LOW (ref 39.0–52.0)
Hemoglobin: 10.9 g/dL — ABNORMAL LOW (ref 13.0–17.0)
MCH: 28.4 pg (ref 26.0–34.0)
MCHC: 30.9 g/dL (ref 30.0–36.0)
MCV: 91.9 fL (ref 80.0–100.0)
Platelets: 207 10*3/uL (ref 150–400)
RBC: 3.84 MIL/uL — ABNORMAL LOW (ref 4.22–5.81)
RDW: 15 % (ref 11.5–15.5)
WBC: 9.8 10*3/uL (ref 4.0–10.5)
nRBC: 0 % (ref 0.0–0.2)

## 2019-05-28 LAB — GLUCOSE, CAPILLARY
Glucose-Capillary: 115 mg/dL — ABNORMAL HIGH (ref 70–99)
Glucose-Capillary: 172 mg/dL — ABNORMAL HIGH (ref 70–99)
Glucose-Capillary: 140 mg/dL — ABNORMAL HIGH (ref 70–99)
Glucose-Capillary: 160 mg/dL — ABNORMAL HIGH (ref 70–99)

## 2019-05-28 LAB — BASIC METABOLIC PANEL
Anion gap: 10 (ref 5–15)
BUN: 48 mg/dL — ABNORMAL HIGH (ref 8–23)
CO2: 31 mmol/L (ref 22–32)
Calcium: 8.5 mg/dL — ABNORMAL LOW (ref 8.9–10.3)
Chloride: 94 mmol/L — ABNORMAL LOW (ref 98–111)
Creatinine, Ser: 2.99 mg/dL — ABNORMAL HIGH (ref 0.61–1.24)
GFR calc Af Amer: 21 mL/min — ABNORMAL LOW (ref >60)
GFR calc non Af Amer: 18 mL/min — ABNORMAL LOW (ref >60)
Glucose, Bld: 189 mg/dL — ABNORMAL HIGH (ref 70–99)
Potassium: 4.6 mmol/L (ref 3.5–5.1)
Sodium: 135 mmol/L (ref 135–145)

## 2019-05-28 SURGERY — AV NODE ABLATION

## 2019-05-28 MED ORDER — BUPIVACAINE HCL (PF) 0.25 % IJ SOLN
INTRAMUSCULAR | Status: AC
  Filled 2019-05-28: qty 30

## 2019-05-28 MED ORDER — BUPIVACAINE HCL (PF) 0.25 % IJ SOLN
INTRAMUSCULAR | Status: DC | PRN
  Administered 2019-05-28: 60 mL

## 2019-05-28 MED ORDER — SODIUM CHLORIDE 0.9% FLUSH: 3.0000 mL | INTRAVENOUS | Status: DC | PRN

## 2019-05-28 MED ORDER — ACETAMINOPHEN 325 MG PO TABS: 650.0000 mg | ORAL_TABLET | ORAL | Status: DC | PRN

## 2019-05-28 MED ORDER — ONDANSETRON HCL 4 MG/2ML IJ SOLN: 4.0000 mg | Freq: Four times a day (QID) | INTRAMUSCULAR | Status: DC | PRN

## 2019-05-28 MED ORDER — SODIUM CHLORIDE 0.9% FLUSH
3.0000 mL | Freq: Two times a day (BID) | INTRAVENOUS | Status: DC
  Administered 2019-05-28 – 2019-05-29 (×2): 3 mL via INTRAVENOUS

## 2019-05-28 MED ORDER — LIDOCAINE HCL (PF) 1 % IJ SOLN: INTRAMUSCULAR | Status: DC | PRN

## 2019-05-28 MED ORDER — HEPARIN (PORCINE) IN NACL 1000-0.9 UT/500ML-% IV SOLN
INTRAVENOUS | Status: AC
  Filled 2019-05-28: qty 500

## 2019-05-28 MED ORDER — SODIUM CHLORIDE 0.9 % IV SOLN: 250.0000 mL | INTRAVENOUS | Status: DC | PRN

## 2019-05-28 MED ORDER — HYDROCODONE-ACETAMINOPHEN 5-325 MG PO TABS
1.0000 | ORAL_TABLET | ORAL | Status: DC | PRN
  Administered 2019-05-29 (×2): 1 via ORAL
  Filled 2019-05-28 (×2): qty 1

## 2019-05-28 MED ORDER — HEPARIN (PORCINE) IN NACL 1000-0.9 UT/500ML-% IV SOLN
INTRAVENOUS | Status: DC | PRN
  Administered 2019-05-28: 500 mL

## 2019-05-28 SURGICAL SUPPLY — 8 items
CATH CELSIUS THERMO F CV 7FR (ABLATOR) ×1 IMPLANT
DEVICE CLOSURE PERCLS PRGLD 6F (VASCULAR PRODUCTS) IMPLANT
PACK EP LATEX FREE (CUSTOM PROCEDURE TRAY) ×2
PACK EP LF (CUSTOM PROCEDURE TRAY) ×1 IMPLANT
PAD PRO RADIOLUCENT 2001M-C (PAD) ×2 IMPLANT
PERCLOSE PROGLIDE 6F (VASCULAR PRODUCTS) ×2
SHEATH PINNACLE 8F 10CM (SHEATH) ×1 IMPLANT
SHEATH PROBE COVER 6X72 (BAG) ×1 IMPLANT

## 2019-05-28 NOTE — Progress Notes (Signed)
The patient has been discussed at length with Dr Curt Bears as well as Adline Peals in AF clinic.  He has medicine refractory afib with RVR.   Hypotension limits rate control.  He is s/p PPM with stable lead measurements and a healing pocket, though with some degree of hematoma.  I have spoken with the patient and his daughter at length about AV nodal ablation. Risk, benefits, and alternatives to radiofrequency ablation of the AV node were also discussed in detail today. These risks include but are not limited to stroke, bleeding, vascular damage, tamponade, perforation, damage to the heart and other structures, pacemaker lead dislodgement, worsening renal function, and death. The patient understands these risk and wishes to proceed.  He and his daughter are aware that he will become device dependant afterwards.  Thompson Grayer MD, [place] 05/28/2019 4:50 PM

## 2019-05-28 NOTE — Progress Notes (Signed)
Physical Therapy Treatment Patient Details Name: Wesley Garcia MRN: [id number] DOB: [birth year] Today's Date: 05/28/2019    History of Present Illness Pt is an 86 y/o male admitted secondary to increased SOB and weakness. Found to have a fib and is s/p pacemaker insertion. PMH includes a fib, DM, and HTN.     PT Comments    Continue to follow pt working on mobility, independence, safety and activity tolerance. Pt still quite confused reinforced WB precaution and use of LUE multi times during session but no carry over noted. Pt needing mod a to get to sitting edge of bed this morning but needing min to mod a at times to maintain sitting as pt was not able to maintain sitting otherwise. Attempted to stand and scoot at side of bed but with max a unable to get to stance. PT will continue to follow and continue to reinforce safety and precautions.      Follow Up Recommendations  SNF;Supervision/Assistance - 24 hour     Equipment Recommendations  None recommended by PT    Recommendations for Other Services       Precautions / Restrictions Precautions Precautions: Fall;ICD/Pacemaker Precaution Comments: Pacemaker precautions Restrictions Weight Bearing Restrictions: Yes LUE Weight Bearing: Non weight bearing Other Position/Activity Restrictions: pacemaker precautions    Mobility  Bed Mobility Overal bed mobility: Needs Assistance Bed Mobility: Supine to Sit;Sit to Supine     Supine to sit: Mod assist Sit to supine: Mod assist   General bed mobility comments: was able to get to edge of bed and sit approx 5-80mins, poor trunk control needing repositioning and readjustment also max encouragement to complete task  Transfers Overall transfer level: Needs assistance Equipment used: 1 person hand held assist Transfers: Sit to/from Stand Sit to Stand: Max assist         General transfer comment: not able to get to stance to scoot up at edge of bed  Ambulation/Gait              General Gait Details: unable to ambulate at this time   Stairs             Wheelchair Mobility    Modified Rankin (Stroke Patients Only)       Balance Overall balance assessment: Needs assistance Sitting-balance support: Single extremity supported;Feet supported Sitting balance-Leahy Scale: Poor Sitting balance - Comments: needing continued cues for LUE WB and readjustment, unable to maintain sitting w/o min assist, falling backwards Postural control: Posterior lean   Standing balance-Leahy Scale: Zero Standing balance comment: unable to stand this session                            Cognition Arousal/Alertness: Awake/alert Behavior During Therapy: Anxious Overall Cognitive Status: No family/caregiver present to determine baseline cognitive functioning                                 General Comments: attempted to educate multiple times on precautions and reinforcement but each time no carry over noted      Exercises      General Comments        Pertinent Vitals/Pain Pain Assessment: No/denies pain Pain Intervention(s): Limited activity within patient's tolerance;Monitored during session    Home Living                      Prior Function  PT Goals (current goals can now be found in the care plan section) Acute Rehab PT Goals Patient Stated Goal: did not state PT Goal Formulation: With patient Time For Goal Achievement: 06/05/19 Potential to Achieve Goals: Fair Progress towards PT goals: Not progressing toward goals - comment    Frequency    Min 2X/week      PT Plan Current plan remains appropriate    Co-evaluation              AM-PAC PT "6 Clicks" Mobility   Outcome Measure    Help needed moving from lying on your back to sitting on the side of a flat bed without using bedrails?: A Lot Help needed moving to and from a bed to a chair (including a wheelchair)?: Total Help needed  standing up from a chair using your arms (e.g., wheelchair or bedside chair)?: Total Help needed to walk in hospital room?: Total Help needed climbing 3-5 steps with a railing? : Total 6 Click Score: 6    End of Session Equipment Utilized During Treatment: Gait belt;Oxygen Activity Tolerance: Patient limited by fatigue;Patient limited by lethargy Patient left: in bed;with call bell/phone within reach;with bed alarm set Nurse Communication: Mobility status PT Visit Diagnosis: Unsteadiness on feet (R26.81);Muscle weakness (generalized) (M62.81);History of falling (Z91.81)     Time: 8115-7262 PT Time Calculation (min) (ACUTE ONLY): 24 min  Charges:  $Therapeutic Activity: 23-37 mins                     Horald Chestnut, PT    Delford Field 05/28/2019, 12:43 PM

## 2019-05-28 NOTE — Progress Notes (Signed)
Pt leaves cath lab holding in stable condition. Rt groin site is unremarkable. Dressing is CDI.

## 2019-05-28 NOTE — Progress Notes (Signed)
Pt had 12 beat run of vtach. On assessment pt is asymptomatic resting in bed, VSS. MD notified.

## 2019-05-28 NOTE — TOC Progression Note (Signed)
Transition of Care [facility]) - Progression Note    Patient Details  Name: Wesley Garcia MRN: [id number] Date of Birth: January 10, 1930  Transition of Care [facility]) CM/SW [place], [place] Phone Number: 05/28/2019, 10:39 AM  Clinical Narrative:     CSW called [employer] to inquire a new insurance authorization for patient. Waiting on approval from insurance. Reference number is [id number].   TOC will continue to follow     Expected Discharge Plan: [place] Barriers to Discharge: Insurance Authorization  Expected Discharge Plan and Services Expected Discharge Plan: [place] In-house Referral: Clinical Social Work   Post Acute Care Choice: [place] Living arrangements for the past 2 months: Single Family Home                                       Social Determinants of Health (SDOH) Interventions    Readmission Risk Interventions No flowsheet data found.

## 2019-05-28 NOTE — Progress Notes (Addendum)
Electrophysiology Rounding Note  Patient Name: Wesley Garcia Date of Encounter: 05/28/2019  Primary Cardiologist: Dr. Irish Lack Electrophysiologist: Dr. Curt Bears   Subjective   The patient is frustrated to be back in AF.  He is willing to proceed with AV nodal ablation.   Inpatient Medications    Scheduled Meds:  amiodarone  200 mg Oral Daily   insulin aspart  0-9 Units Subcutaneous TID WC   insulin glargine  15 Units Subcutaneous Daily   linagliptin  5 mg Oral Daily   metoprolol tartrate  25 mg Oral BID   multivitamin  1 tablet Oral Daily   mupirocin ointment   Nasal BID   pantoprazole  40 mg Oral BID AC   potassium chloride  20 mEq Oral Daily   sodium chloride flush  3 mL Intravenous Q12H   tamsulosin  0.4 mg Oral QHS   Continuous Infusions:  sodium chloride     PRN Meds: sodium chloride, acetaminophen, ALPRAZolam, nitroGLYCERIN, ondansetron (ZOFRAN) IV, oxyCODONE-acetaminophen, sodium chloride flush   Vital Signs    Vitals:   05/27/19 2021 05/28/19 0603 05/28/19 0800 05/28/19 0806  BP: 104/64 92/64  95/71  Pulse: 63 80 (!) 110 90  Resp: 19 19    Temp: 98.1 F (36.7 C) (!) 97.5 F (36.4 C)    TempSrc: Oral Oral    SpO2: 97% 95%  94%  Weight:  64.6 kg    Height:        Intake/Output Summary (Last 24 hours) at 05/28/2019 0823 Last data filed at 05/28/2019 3825 Gross per 24 hour  Intake --  Output 450 ml  Net -450 ml   Filed Weights   05/26/19 0333 05/27/19 0458 05/28/19 0603  Weight: 66.7 kg 62.9 kg 64.6 kg    Physical Exam    GEN- The patient is elderly appearing, alert and oriented x 3 today.   Head- normocephalic, atraumatic Eyes-  Sclera clear, conjunctiva pink Ears- hearing intact Oropharynx- clear Neck- supple Lungs- Irregularly irregular rate and rhythm, no murmurs, rubs or gallops GI- soft, NT, ND, + BS Extremities- no clubbing, cyanosis, or edema Skin- no rash or lesion Psych- euthymic mood, full affect Neuro- strength and sensation  are intact  Labs    CBC No results for input(s): WBC, NEUTROABS, HGB, HCT, MCV, PLT in the last 72 hours. Basic Metabolic Panel Recent Labs    05/26/19 0413 05/27/19 0313  NA 134* 134*  K 5.3* 3.9  CL 94* 94*  CO2 28 31  GLUCOSE 122* 162*  BUN 33* 40*  CREATININE 2.37* 2.92*  CALCIUM 8.5* 8.4*  MG 2.1  --    Liver Function Tests No results for input(s): AST, ALT, ALKPHOS, BILITOT, PROT, ALBUMIN in the last 72 hours. No results for input(s): LIPASE, AMYLASE in the last 72 hours. Cardiac Enzymes No results for input(s): CKTOTAL, CKMB, CKMBINDEX, TROPONINI in the last 72 hours. BNP Invalid input(s): POCBNP D-Dimer No results for input(s): DDIMER in the last 72 hours. Hemoglobin A1C No results for input(s): HGBA1C in the last 72 hours. Fasting Lipid Panel No results for input(s): CHOL, HDL, LDLCALC, TRIG, CHOLHDL, LDLDIRECT in the last 72 hours. Thyroid Function Tests No results for input(s): TSH, T4TOTAL, T3FREE, THYROIDAB in the last 72 hours.  Invalid input(s): FREET3  Telemetry    AF with rates 90-120s at rest (personally reviewed)  Radiology    VAS [place] UPPER EXTREMITY VENOUS DUPLEX  Result Date: 05/26/2019 UPPER VENOUS STUDY  Indications: Swelling Comparison Study: no prior  Performing Technologist: Abram Sander RVS  Examination Guidelines: A complete evaluation includes B-mode imaging, spectral Doppler, color Doppler, and power Doppler as needed of all accessible portions of each vessel. Bilateral testing is considered an integral part of a complete examination. Limited examinations for reoccurring indications may be performed as noted.  Right Findings: +----------+------------+---------+-----------+----------+-------+ RIGHT     CompressiblePhasicitySpontaneousPropertiesSummary +----------+------------+---------+-----------+----------+-------+ Subclavian    Full       Yes       Yes                       +----------+------------+---------+-----------+----------+-------+  Left Findings: +----------+------------+---------+-----------+----------+-------+ LEFT      CompressiblePhasicitySpontaneousPropertiesSummary +----------+------------+---------+-----------+----------+-------+ IJV           Full       Yes       Yes                      +----------+------------+---------+-----------+----------+-------+ Subclavian               Yes       Yes                      +----------+------------+---------+-----------+----------+-------+ Axillary      Full       Yes       Yes                      +----------+------------+---------+-----------+----------+-------+ Brachial      Full       Yes       Yes                      +----------+------------+---------+-----------+----------+-------+ Radial        Full                                          +----------+------------+---------+-----------+----------+-------+ Ulnar         Full                                          +----------+------------+---------+-----------+----------+-------+ Cephalic      Full                                          +----------+------------+---------+-----------+----------+-------+ Basilic       Full                                          +----------+------------+---------+-----------+----------+-------+  Summary:  Right: No evidence of thrombosis in the subclavian.  Left: No evidence of deep vein thrombosis in the upper extremity. No evidence of superficial vein thrombosis in the upper extremity.  *See table(s) above for measurements and observations.  Diagnosing physician: Curt Jews MD Electronically signed by Curt Jews MD on 05/26/2019 at 3:36:12 PM.    Final      Patient Profile     86 y.o. male with a history of CKD IV, DM, HTN, squamous cell carcinoma of hard palate, fall with hip fracture s/p surgical repair 02/27/19, and persistent atrial fibrillation dx  03/14/2019, on  Xarelto for CHA2DS2-VASc of 4, s/p DCCV 04/07/2019, recurrent A. fib and started on amiodarone 04/14/2019, repeat cardioversion on 05/12/2019.He arrived to the Afib clinic with ERAF, rates 100's-110's and hypotensive with SBP 80's and referredto the ER. Initial plan was to pace and ablate. He was quite symptomatic, weak, SOB an on occassion associated with CP. Received dual chamber PPM by Latonja Bobeck on 05/21/19. Xarelto on hold due to extensive pacemaker site ecchymosis.   Assessment & Plan    1.  Persistent atrial fibrillation, RVR - S/p  attempt for CRT-P though no viable/acceptable veins and is s/p dual chamber PPM implant 3/17. He has had some oozing early, though no bleeding. Large ecchymosis at the pacer site. Xarelto on Hold - He has gone back into AF, feels poorly, and medications are further limited by BP.  - Continue lopressor - Levetta Bognar plan for AV nodal ablation today. Dr. Curt Bears to discuss with Dr. Rayann Heman.   2.  Acute on chronic systolic CHF Now over-diuresed Labs pending.   3. CKD stage IV Cr 2.92 yesterday. Pending today.    4.  Hypertension Now hypotensive in setting of poorly controlled AF   5.  NSVT Brief episodes BMET pending. Keep K > 3.9 and Mg > 1.9.  6. Functional decline Plan for SNF once stable.   For questions or updates, please contact [place] Please consult [email or web address] for contact info under Cardiology/STEMI.  Signed, Shirley Friar, PA-C  05/28/2019, 8:23 AM   I have seen and examined this patient with Oda Kilts.  Agree with above, note added to reflect my findings.  On exam, tachycardic, irregular.  Patient is unfortunately gone back into atrial fibrillation.  He is now status post dual-chamber pacemaker.  He has had multiple cardioversions and has been loaded on amiodarone and despite this has continued to have atrial fibrillation.  He would thus likely benefit from AV node ablation.  I have discussed this with Dr. Rayann Heman who is agreed to the  procedure.  Lamark Schue M. Rhilynn Preyer MD 05/28/2019 8:32 AM

## 2019-05-28 NOTE — Progress Notes (Signed)
Progress Note  Patient Name: Wesley Garcia Date of Encounter: 05/28/2019  Primary Cardiologist: Larae Grooms, MD   Subjective   Continued Atrial fibrillation. Discussed with EP, feeling well otherwise. For AV node ablation today. AM labs pending for renal function.  Inpatient Medications    Scheduled Meds: . amiodarone  200 mg Oral Daily  . insulin aspart  0-9 Units Subcutaneous TID WC  . insulin glargine  15 Units Subcutaneous Daily  . linagliptin  5 mg Oral Daily  . metoprolol tartrate  25 mg Oral BID  . multivitamin  1 tablet Oral Daily  . mupirocin ointment   Nasal BID  . pantoprazole  40 mg Oral BID AC  . potassium chloride  20 mEq Oral Daily  . sodium chloride flush  3 mL Intravenous Q12H  . tamsulosin  0.4 mg Oral QHS   Continuous Infusions: . sodium chloride     PRN Meds: sodium chloride, acetaminophen, ALPRAZolam, nitroGLYCERIN, ondansetron (ZOFRAN) IV, oxyCODONE-acetaminophen, sodium chloride flush   Vital Signs    Vitals:   05/27/19 2021 05/28/19 0603 05/28/19 0800 05/28/19 0806  BP: 104/64 92/64  95/71  Pulse: 63 80 (!) 110 90  Resp: 19 19    Temp: 98.1 F (36.7 C) (!) 97.5 F (36.4 C)    TempSrc: Oral Oral    SpO2: 97% 95%  94%  Weight:  64.6 kg    Height:        Intake/Output Summary (Last 24 hours) at 05/28/2019 0814 Last data filed at 05/28/2019 3710 Gross per 24 hour  Intake --  Output 450 ml  Net -450 ml   Last 3 Weights 05/28/2019 05/27/2019 05/26/2019  Weight (lbs) 142 lb 6.7 oz 138 lb 10.7 oz 147 lb 0.8 oz  Weight (kg) 64.6 kg 62.9 kg 66.7 kg      Telemetry    Afib rate 100-110- Personally Reviewed  ECG     no new- personally reviewed  Physical Exam   Constitutional: No acute distress Eyes: sclera non-icteric, normal conjunctiva and lids ENMT: dentures, moist mucous membranes Cardiovascular: iRRR, no murmurs. L chest wall pacemaker site with improving large ecchymosis.  Respiratory: shallow breaths, diminished in bases.   GI : normal bowel sounds, soft and nontender. No distention.   MSK: extremities warm, well perfused. No edema.  NEURO: grossly nonfocal exam, moves all extremities. PSYCH: alert and oriented x 3, normal mood and affect.   Labs    High Sensitivity Troponin:   Recent Labs  Lab 05/20/19 1552 05/20/19 1955  TROPONINIHS 36* 34*      Chemistry Recent Labs  Lab 05/25/19 1548 05/26/19 0413 05/27/19 0313  NA 133* 134* 134*  K 4.3 5.3* 3.9  CL 96* 94* 94*  CO2 22 28 31   GLUCOSE 169* 122* 162*  BUN 34* 33* 40*  CREATININE 2.32* 2.37* 2.92*  CALCIUM 8.2* 8.5* 8.4*  GFRNONAA 24* 24* 18*  GFRAA 28* 28* 21*  ANIONGAP 15 12 9      Hematology No results for input(s): WBC, RBC, HGB, HCT, MCV, MCH, MCHC, RDW, PLT in the last 168 hours.  BNP No results for input(s): BNP, PROBNP in the last 168 hours.   DDimer No results for input(s): DDIMER in the last 168 hours.   Radiology    DG Chest 2 View Result Date: 05/22/2019 CLINICAL DATA:  Post cardiac device insertion EXAM: CHEST - 2 VIEW COMPARISON:  05/20/2019 FINDINGS: New LEFT subclavian transvenous pacemaker with leads projecting at RIGHT atrium and RIGHT  ventricle. Rotated to the LEFT. Borderline enlargement of cardiac silhouette. Mediastinal contours and pulmonary vascularity normal for technique. Bibasilar pleural effusions and atelectasis. Upper lungs clear. No definite infiltrate or pneumothorax. Bones demineralized. IMPRESSION: No pneumothorax following pacemaker insertion. BILATERAL pleural effusions and bibasilar atelectasis. Electronically Signed   By: Lavonia Dana M.D.   On: 05/22/2019 08:26    Cardiac Studies   ECHO: 04/21/2019  1. Septal apical distal anterior wall mid and apical inferior wall  hypokinesis Basal function preserved Abnormal GLS -6. Left ventricular  ejection fraction, by estimation, is 25%. The left ventricle has severely  decreased function. The left ventricle  demonstrates regional wall motion  abnormalities (see scoring  diagram/findings for description). The left ventricular internal cavity  size was moderately to severely dilated. Left ventricular diastolic  parameters are indeterminate.   2. Right ventricular systolic function is normal. The right ventricular  size is normal. There is moderately elevated pulmonary artery systolic  pressure.   3. Left atrial size was mildly dilated.   4. The mitral valve is normal in structure and function. Mild mitral  valve regurgitation. No evidence of mitral stenosis.   5. The aortic valve is tricuspid. Aortic valve regurgitation is trivial.  Mild aortic valve sclerosis is present, with no evidence of aortic valve  stenosis.   6. The inferior vena cava is dilated in size with >50% respiratory  variability, suggesting right atrial pressure of 8 mmHg.   Patient Profile     86 y.o. male with a history of CKD IV, DM, HTN, squamous cell carcinoma of hard palate, fall with hip fracture s/p surgical repair 02/27/19, and persistent atrial fibrillation dx 03/14/2019, on Xarelto for CHA2DS2-VASc of 4, s/p DCCV 04/07/2019, recurrent A. fib and started on amiodarone 04/14/2019, repeat cardioversion on 05/12/2019.He arrived to the Afib clinic with ERAF, rates 100's-110's and hypotensive with SBP 80's and referredto the ER. Initial plan was to pace and ablate. He was quite symptomatic, weak, SOB an on occassion associated with CP. Received dual chamber PPM by Camnitz on 05/21/19. Xarelto on hold due to extensive pacemaker site ecchymosis.   Assessment & Plan   1.  Persistent atrial fibrillation, RVR - S/p  attempt for CRT-P though no viable/acceptable veins and is s/p dual chamber PPM implant 3/17. He has had some oozing early, though no bleeding. Large ecchymosis at the pacer site.  - Hold xarelto given extensive ecchymosis at site, reviewed with EP, will hold until restarted by EP - continue metoprolol - for AV node ablation today per EP given continued Afib  with hypotension.   2.  Pleural effusions 3.  Acute on chronic systolic CHF 4. CKD stage IV - I/O 1.3 L output yesterday, net -  6.3  L for the admission.  - Cr baseline is approximately 1.8-1.9, 2.34>>2.16>>2.26 >> 2.37>>2.92 >> labs pending this am.  - aggressively diuresed. Creatinine increased. Hold diuresis today pending labs and clinical status.    5.  Diabetes: - Continue same, appreciate DM coordinator recs  6.  Hypertension - mild hypotension, monitor.   7.  NSVT - brief episodes, keep K and Mg replete  8. Functional decline     PT recs noted for SNF     Case management aware     Continue with PT while here   Plan follow up with Dr. Irish Lack in 2-4 weeks.   For questions or updates, please contact [place] Please consult [email or web address] for contact info under      Signed,  Elouise Munroe, MD  05/28/2019, 8:14 AM

## 2019-05-28 NOTE — Interval H&P Note (Signed)
History and Physical Interval Note:  05/28/2019 4:51 PM  [place]  has presented today for surgery, with the diagnosis of Afib.  The various methods of treatment have been discussed with the patient and family. After consideration of risks, benefits and other options for treatment, the patient has consented to  Procedure(s): AV NODE ABLATION (N/A) as a surgical intervention.  The patient's history has been reviewed, patient examined, no change in status, stable for surgery.  I have reviewed the patient's chart and labs.  Questions were answered to the patient's satisfaction.     Thompson Grayer

## 2019-05-28 NOTE — Plan of Care (Signed)

## 2019-05-28 NOTE — H&P (View-Only) (Signed)
The patient has been discussed at length with Dr Curt Bears as well as Adline Peals in AF clinic.  He has medicine refractory afib with RVR.   Hypotension limits rate control.  He is s/p PPM with stable lead measurements and a healing pocket, though with some degree of hematoma.  I have spoken with the patient and his daughter at length about AV nodal ablation. Risk, benefits, and alternatives to radiofrequency ablation of the AV node were also discussed in detail today. These risks include but are not limited to stroke, bleeding, vascular damage, tamponade, perforation, damage to the heart and other structures, pacemaker lead dislodgement, worsening renal function, and death. The patient understands these risk and wishes to proceed.  He and his daughter are aware that he will become device dependant afterwards.  Wesley Grayer MD, [place] 05/28/2019 4:50 PM

## 2019-05-29 ENCOUNTER — Ambulatory Visit: Payer: Medicare PPO | Admitting: Internal Medicine

## 2019-05-29 ENCOUNTER — Inpatient Hospital Stay
Admission: RE | Admit: 2019-05-29 | Discharge: 2019-06-16 | Disposition: A | Discharge status: Home / Self Care | Payer: Medicare PPO | Source: Ambulatory Visit | Attending: Internal Medicine | Admitting: Internal Medicine

## 2019-05-29 DIAGNOSIS — I248 Other forms of acute ischemic heart disease: Secondary | ICD-10-CM

## 2019-05-29 DIAGNOSIS — I4891 Unspecified atrial fibrillation: Secondary | ICD-10-CM

## 2019-05-29 LAB — BASIC METABOLIC PANEL
Anion gap: 11 (ref 5–15)
BUN: 50 mg/dL — ABNORMAL HIGH (ref 8–23)
CO2: 30 mmol/L (ref 22–32)
Calcium: 8.5 mg/dL — ABNORMAL LOW (ref 8.9–10.3)
Chloride: 94 mmol/L — ABNORMAL LOW (ref 98–111)
Creatinine, Ser: 2.88 mg/dL — ABNORMAL HIGH (ref 0.61–1.24)
GFR calc Af Amer: 22 mL/min — ABNORMAL LOW (ref >60)
GFR calc non Af Amer: 19 mL/min — ABNORMAL LOW (ref >60)
Glucose, Bld: 179 mg/dL — ABNORMAL HIGH (ref 70–99)
Potassium: 4.6 mmol/L (ref 3.5–5.1)
Sodium: 135 mmol/L (ref 135–145)

## 2019-05-29 LAB — GLUCOSE, CAPILLARY
Glucose-Capillary: 150 mg/dL — ABNORMAL HIGH (ref 70–99)
Glucose-Capillary: 177 mg/dL — ABNORMAL HIGH (ref 70–99)
Glucose-Capillary: 149 mg/dL — ABNORMAL HIGH (ref 70–99)

## 2019-05-29 LAB — CBC
HCT: 33.1 % — ABNORMAL LOW (ref 39.0–52.0)
Hemoglobin: 10.2 g/dL — ABNORMAL LOW (ref 13.0–17.0)
MCH: 28 pg (ref 26.0–34.0)
MCHC: 30.8 g/dL (ref 30.0–36.0)
MCV: 90.9 fL (ref 80.0–100.0)
Platelets: 223 10*3/uL (ref 150–400)
RBC: 3.64 MIL/uL — ABNORMAL LOW (ref 4.22–5.81)
RDW: 15 % (ref 11.5–15.5)
WBC: 8.9 10*3/uL (ref 4.0–10.5)
nRBC: 0 % (ref 0.0–0.2)

## 2019-05-29 LAB — SARS CORONAVIRUS 2 (TAT 6-24 HRS): SARS Coronavirus 2: NEGATIVE

## 2019-05-29 MED ORDER — FUROSEMIDE 40 MG PO TABS: 40.0000 mg | ORAL_TABLET | Freq: Every day | ORAL | 2 refills | Status: DC

## 2019-05-29 MED ORDER — POTASSIUM CHLORIDE CRYS ER 20 MEQ PO TBCR: 20.0000 meq | EXTENDED_RELEASE_TABLET | Freq: Every day | ORAL | 1 refills | Status: DC

## 2019-05-29 NOTE — Social Work (Addendum)
CSW received insurance authorization, good 3/21 thru 3/29. Authorization number currently pending.   Criss Alvine, CSW

## 2019-05-29 NOTE — TOC Transition Note (Signed)
Transition of Care [facility]) - CM/SW Discharge Note   Patient Details  Name: Wesley Garcia MRN: [id number] Date of Birth: Feb 07, 1930  Transition of Care [facility] CM/SW Contact:  Jacquelynn Cree Phone Number: 05/29/2019, 3:43 PM   Clinical Narrative:    Patient will DC to: [facility] Anticipated DC date: 05/29/2019 Family notified: Wife & daugther-in-law bedside Transport by: Corey Harold   Per MD patient ready for DC to [facility]. RN, patient, patient's family, and facility notified of DC. Discharge Summary and FL2 sent to facility. RN to call report prior to discharge [phone]). DC packet on chart. Ambulance transport requested for patient.   CSW will sign off for now as social work intervention is no longer needed. Please consult [place] again if new needs arise.    Final next level of care: Skilled Nursing Facility Barriers to Discharge: Insurance Authorization   Patient Goals and CMS Choice   CMS Medicare.gov Compare Post Acute Care list provided to:: Patient Represenative (must comment)(Te[place]) Choice offered to / list presented to : Adult Children  Discharge Placement                       Discharge Plan and Services In-house Referral: Clinical Social Work   Post Acute Care Choice: [place]                               Social Determinants of Health (SDOH) Interventions     Readmission Risk Interventions No flowsheet data found.

## 2019-05-29 NOTE — Plan of Care (Signed)
  Problem: Clinical Measurements: Goal: Will remain free from infection Outcome: Progressing Goal: Respiratory complications will improve Outcome: Progressing Goal: Cardiovascular complication will be avoided Outcome: Progressing   Problem: Nutrition: Goal: Adequate nutrition will be maintained Outcome: Progressing   Problem: Coping: Goal: Level of anxiety will decrease Outcome: Progressing   Problem: Elimination: Goal: Will not experience complications related to urinary retention Outcome: Progressing   Problem: Pain Managment: Goal: General experience of comfort will improve Outcome: Progressing   Problem: Safety: Goal: Ability to remain free from injury will improve Outcome: Progressing   Problem: Skin Integrity: Goal: Risk for impaired skin integrity will decrease Outcome: Progressing

## 2019-05-29 NOTE — Discharge Instructions (Signed)
    Supplemental Discharge Instructions for  Pacemaker/Defibrillator Patients  Activity No heavy lifting or vigorous activity with your left/right arm for 6 to 8 weeks.  Do not raise your left/right arm above your head for one week.       WOUND CARE - Keep the wound area clean and dry.  - The tape/steri-strips on your wound will fall off; do not pull them off.  No bandage is needed on the site.  DO  NOT apply any creams, oils, or ointments to the wound area. - If you notice any drainage or discharge from the wound, any swelling or bruising at the site, or you develop a fever > 101? F after you are discharged home, call the office at once.  Special Instructions - You are still able to use cellular telephones; use the ear opposite the side where you have your pacemaker/defibrillator.  Avoid carrying your cellular phone near your device. - When traveling through airports, show security personnel your identification card to avoid being screened in the metal detectors.  Ask the security personnel to use the hand wand. - Avoid arc welding equipment, MRI testing (magnetic resonance imaging), TENS units (transcutaneous nerve stimulators).  Call the office for questions about other devices. - Avoid electrical appliances that are in poor condition or are not properly grounded. - Microwave ovens are safe to be near or to operate.

## 2019-05-29 NOTE — Progress Notes (Signed)
Progress Note  Patient Name: Wesley Garcia Date of Encounter: 05/29/2019  Primary Cardiologist: Larae Grooms, MD   Subjective   S/p AV node ablation, stable per EP Feels well, plan to discharge to SNF when ready  Inpatient Medications    Scheduled Meds: . insulin aspart  0-9 Units Subcutaneous TID WC  . insulin glargine  15 Units Subcutaneous Daily  . linagliptin  5 mg Oral Daily  . multivitamin  1 tablet Oral Daily  . mupirocin ointment   Nasal BID  . pantoprazole  40 mg Oral BID AC  . potassium chloride  20 mEq Oral Daily  . sodium chloride flush  3 mL Intravenous Q12H  . tamsulosin  0.4 mg Oral QHS   Continuous Infusions: . sodium chloride     PRN Meds: sodium chloride, acetaminophen, ALPRAZolam, HYDROcodone-acetaminophen, nitroGLYCERIN, ondansetron (ZOFRAN) IV, oxyCODONE-acetaminophen, sodium chloride flush   Vital Signs    Vitals:   05/28/19 2000 05/28/19 2049 05/29/19 0551 05/29/19 0810  BP: (!) 101/54  (!) 105/56 113/62  Pulse: 85  84 82  Resp: 14  (!) 21   Temp:  98.8 F (37.1 C) 98 F (36.7 C)   TempSrc:  Oral Oral   SpO2: 100%  99% 100%  Weight:   63.9 kg   Height:        Intake/Output Summary (Last 24 hours) at 05/29/2019 0934 Last data filed at 05/29/2019 4332 Gross per 24 hour  Intake 75 ml  Output 600 ml  Net -525 ml   Last 3 Weights 05/29/2019 05/28/2019 05/27/2019  Weight (lbs) 140 lb 14 oz 142 lb 6.7 oz 138 lb 10.7 oz  Weight (kg) 63.9 kg 64.6 kg 62.9 kg      Telemetry    V pace, AF - Personally Reviewed  ECG    V pace rate 82- personally reviewed  Physical Exam   GEN: No acute distress.   Neck: No JVD Cardiac: RRR, no murmurs, rubs, or gallops.  Chest: pacer site ecchymosis improving.  Respiratory: bibasilar crackles GI: Soft, nontender, non-distended  MS: No edema; No deformity. Neuro:  Nonfocal  Psych: Normal affect    Labs    High Sensitivity Troponin:   Recent Labs  Lab 05/20/19 1552 05/20/19 1955    TROPONINIHS 36* 34*      Chemistry Recent Labs  Lab 05/27/19 0313 05/28/19 0925 05/29/19 0206  NA 134* 135 135  K 3.9 4.6 4.6  CL 94* 94* 94*  CO2 31 31 30   GLUCOSE 162* 189* 179*  BUN 40* 48* 50*  CREATININE 2.92* 2.99* 2.88*  CALCIUM 8.4* 8.5* 8.5*  GFRNONAA 18* 18* 19*  GFRAA 21* 21* 22*  ANIONGAP 9 10 11      Hematology Recent Labs  Lab 05/28/19 0925 05/29/19 0206  WBC 9.8 8.9  RBC 3.84* 3.64*  HGB 10.9* 10.2*  HCT 35.3* 33.1*  MCV 91.9 90.9  MCH 28.4 28.0  MCHC 30.9 30.8  RDW 15.0 15.0  PLT 207 223    BNP No results for input(s): BNP, PROBNP in the last 168 hours.   DDimer No results for input(s): DDIMER in the last 168 hours.   Radiology    DG Chest 2 View Result Date: 05/22/2019 CLINICAL DATA:  Post cardiac device insertion EXAM: CHEST - 2 VIEW COMPARISON:  05/20/2019 FINDINGS: New LEFT subclavian transvenous pacemaker with leads projecting at RIGHT atrium and RIGHT ventricle. Rotated to the LEFT. Borderline enlargement of cardiac silhouette. Mediastinal contours and pulmonary vascularity normal for  technique. Bibasilar pleural effusions and atelectasis. Upper lungs clear. No definite infiltrate or pneumothorax. Bones demineralized. IMPRESSION: No pneumothorax following pacemaker insertion. BILATERAL pleural effusions and bibasilar atelectasis. Electronically Signed   By: Lavonia Dana M.D.   On: 05/22/2019 08:26    Cardiac Studies   ECHO: 04/21/2019  1. Septal apical distal anterior wall mid and apical inferior wall  hypokinesis Basal function preserved Abnormal GLS -6. Left ventricular  ejection fraction, by estimation, is 25%. The left ventricle has severely  decreased function. The left ventricle  demonstrates regional wall motion abnormalities (see scoring  diagram/findings for description). The left ventricular internal cavity  size was moderately to severely dilated. Left ventricular diastolic  parameters are indeterminate.   2. Right  ventricular systolic function is normal. The right ventricular  size is normal. There is moderately elevated pulmonary artery systolic  pressure.   3. Left atrial size was mildly dilated.   4. The mitral valve is normal in structure and function. Mild mitral  valve regurgitation. No evidence of mitral stenosis.   5. The aortic valve is tricuspid. Aortic valve regurgitation is trivial.  Mild aortic valve sclerosis is present, with no evidence of aortic valve  stenosis.   6. The inferior vena cava is dilated in size with >50% respiratory  variability, suggesting right atrial pressure of 8 mmHg.   Patient Profile     86 y.o. male with a history of CKD IV, DM, HTN, squamous cell carcinoma of hard palate, fall with hip fracture s/p surgical repair 02/27/19, and persistent atrial fibrillation dx 03/14/2019, on Xarelto for CHA2DS2-VASc of 4, s/p DCCV 04/07/2019, recurrent A. fib and started on amiodarone 04/14/2019, repeat cardioversion on 05/12/2019.He arrived to the Afib clinic with ERAF, rates 100's-110's and hypotensive with SBP 80's and referredto the ER. Initial plan was to pace and ablate. He was quite symptomatic, weak, SOB an on occassion associated with CP. Received dual chamber PPM by [facility] on 05/21/19. Xarelto on hold due to extensive pacemaker site ecchymosis.   Assessment & Plan   1.  Persistent atrial fibrillation, RVR - s/p PPM and now AVN ablation. EP following, device function stable.  -hold xarelto until EP wound check.   2.  Pleural effusions 3.  Acute on chronic systolic CHF 4. CKD stage IV - I/O 600 mL output yesterday, net -  5.8  L for the admission.  - Cr baseline is approximately 1.8-1.9, Cr now downtrending 2.88 -  Plan to discharge home on lasix 40 mg po daily. This can be reviewed further at wound check with EP.    5.  Diabetes: - Continue same, appreciate DM coordinator recs  6.  Hypertension - mild hypotension, monitor.   7.  NSVT - brief episodes, keep K and Mg  replete  8. Functional decline     PT recs noted for SNF     Case management aware     Continue with PT while here  Plan follow up with Dr. Irish Lack in 2-4 weeks.   For questions or updates, please contact [place] Please consult [email or web address] for contact info under      Signed, Elouise Munroe, MD  05/29/2019, 9:34 AM

## 2019-05-29 NOTE — Progress Notes (Addendum)
Electrophysiology Rounding Note  Patient Name: Wesley Garcia Date of Encounter: 05/29/2019  Primary Cardiologist: Irish Lack Electrophysiologist: Curt Bears    Subjective   The patient is doing ok today.  At this time, the patient denies chest pain, shortness of breath, or any new concerns.  Inpatient Medications    Scheduled Meds: . insulin aspart  0-9 Units Subcutaneous TID WC  . insulin glargine  15 Units Subcutaneous Daily  . linagliptin  5 mg Oral Daily  . multivitamin  1 tablet Oral Daily  . mupirocin ointment   Nasal BID  . pantoprazole  40 mg Oral BID AC  . potassium chloride  20 mEq Oral Daily  . sodium chloride flush  3 mL Intravenous Q12H  . tamsulosin  0.4 mg Oral QHS   Continuous Infusions: . sodium chloride     PRN Meds: sodium chloride, acetaminophen, ALPRAZolam, HYDROcodone-acetaminophen, nitroGLYCERIN, ondansetron (ZOFRAN) IV, oxyCODONE-acetaminophen, sodium chloride flush   Vital Signs    Vitals:   05/28/19 2000 05/28/19 2049 05/29/19 0551 05/29/19 0810  BP: (!) 101/54  (!) 105/56 113/62  Pulse: 85  84 82  Resp: 14  (!) 21   Temp:  98.8 F (37.1 C) 98 F (36.7 C)   TempSrc:  Oral Oral   SpO2: 100%  99% 100%  Weight:   63.9 kg   Height:        Intake/Output Summary (Last 24 hours) at 05/29/2019 0910 Last data filed at 05/29/2019 1324 Gross per 24 hour  Intake 75 ml  Output 600 ml  Net -525 ml   Filed Weights   05/27/19 0458 05/28/19 0603 05/29/19 0551  Weight: 62.9 kg 64.6 kg 63.9 kg    Physical Exam    GEN- The patient is elderly and frail appearing, alert and oriented x 3 today.   Head- normocephalic, atraumatic Eyes-  Sclera clear, conjunctiva pink Ears- hearing intact, +HOH  Oropharynx- clear Neck- supple Lungs- Clear to ausculation bilaterally, normal work of breathing Heart- Regular rate and rhythm (paced) GI- soft, NT, ND, + BS Extremities- no clubbing, cyanosis, or edema, +significant LUE ecchymosis  Skin- no rash or  lesion Psych- euthymic mood, full affect Neuro- strength and sensation are intact  Labs    CBC Recent Labs    05/28/19 0925 05/29/19 0206  WBC 9.8 8.9  HGB 10.9* 10.2*  HCT 35.3* 33.1*  MCV 91.9 90.9  PLT 207 401   Basic Metabolic Panel Recent Labs    05/28/19 0925 05/29/19 0206  NA 135 135  K 4.6 4.6  CL 94* 94*  CO2 31 30  GLUCOSE 189* 179*  BUN 48* 50*  CREATININE 2.99* 2.88*  CALCIUM 8.5* 8.5*     Telemetry    AF with V pacing  (personally reviewed)  Radiology    EP STUDY  Result Date: 05/28/2019 SURGEON: Thompson Grayer, MD PREPROCEDURE DIAGNOSIS: 1.  Medicine refractory persistent atrial fibrillation with uncontrolled ventricular rates, 2.  nonischemic cardiomyopathy with CHF (presumed due to tachycardia mediated CM) POSTPROCEDURE DIAGNOSIS: 1.  Medicine refractory persistent atrial fibrillation with uncontrolled ventricular rates, 2.  nonischemic cardiomyopathy with CHF (presumed due to tachycardia mediated CM) PROCEDURES: 1. His bundle recording and pacing 2. RV recording and pacing 3. AV nodal ablation 4. BiV pacemaker interrogation and reprogramming INTRODUCTION:  Wesley Garcia is a 86 y.o. male with a history of symptomatic persistent atrial fibrillation with difficult to control ventricular rates who presents today for AV nodal ablation.  He is s/p recent pacemaker implantation  by Dr Curt Bears.  Rate control efforts have been difficult.   He has severe LA enlargement and has failed AAD therapy. DESCRIPTION OF PROCEDURE: Informed written consent was obtained and the patient was brought to the Electrophysiology Lab in the fasting state. The patient required no sedation for the procedure today. The patient's right groin was prepped and draped in the usual sterile fashion by the EP Lab staff. The patients pacemaker was interrogated and programmed VVI 34 bpm for the procedure.  Device lead pace/sense/impedance values were found to be stable. Direct Ultrasound guidance was  used today for right common femoral venous access with normal vessel patency.  Ultrasound images are captured and stored in the patients chart. Using a percutaneous Seldinger technique, a 8 F hemostasis sheath was placed into the right common femoral vein. A 7 French Celsius 73mm ablation catheter was advanced into the right ventrical for recording and pacing.  This catheter was then pulled back to the His bundle location.  Presenting Measurements: The patient presented to the Electrophysiology Lab in afib with RVR. The  QRS of 74 msec and a Qt of 398 msec. The average RR interval was 658 msec. The  HV interval was 63 milliseconds. Ablation: Ventricular pacing was performed.  The ablation catheter was advanced under fluoroscopic visualization into the area of the His bundle.  A large atrial signal with His and small V signal was identified.  A single RF lesion was delivered in this location with a target temp of 60 degrees at 50 watts for 60 seconds.  Accelerated junctional rhythm was observed followed by AV block and V pacing.  The patient was observed to have an underlying escape rhythm at 30 bpm.  He was observed for 20 minutes without return of conduction. The procedure was therefore considered completed. All catheters were removed and the sheaths were aspirated and flushed. The sheaths were removed and hemostasis was assured with perclose. EBL<22ml. There were no early apparent complications.  The pacemaker was interrogated and lead pace/sense/ impedance values remained unchanged when compared to baseline.  Device was programmed VVIR 80 bpm. CONCLUSIONS: 1. AFib with RVR upon presentation. 2. Successful AV nodal ablation 3. No early apparent complications. Thompson Grayer MD, [facility] 05/28/2019 5:05 PM     Assessment & Plan    1.  Persistent atrial fibrillation with RVR S/p AVN ablation 05/28/19 Routine follow up (scheduled in AVS) Hold Xarelto until after wound check - per prior conversations, daughter has  been concerned about bleeding risks.  I attempted to call her this morning, no answer. I left a message asking her to call to discuss  2.  CKD Stable today  3.  HTN Stable No change required today  Electrophysiology team to see as needed while here. Please call with questions.   For questions or updates, please contact [place] Please consult [email or web address] for contact info under Cardiology/STEMI.  Signed, Chanetta Marshall, NP  05/29/2019, 9:10 AM    I have seen, examined the patient, and reviewed the above assessment and plan.  Changes to above are made where necessary.  On exam, RRR (paced).  Doing well s/p AV nodal ablation.  Groin is without hematoma. I worry that his left chest is very ecchymotic and that he has a device pocket hematoma.  We will keep his xarelto on hold until his wound check with our office.  Electrophysiology team to see as needed while here. Please call with questions.   Co Sign: Thompson Grayer, MD 05/29/2019 8:03 PM

## 2019-05-29 NOTE — Discharge Summary (Addendum)
Discharge Summary    Patient ID: Wesley Garcia MRN: [id number]; DOB: 01/15/1930  Admit date: 05/20/2019 Discharge date: 05/29/2019  Primary Care Provider: Gayland Curry, DO  Primary Cardiologist: Larae Grooms, MD  Primary Electrophysiologist:  None  Discharge Diagnoses    Principal Problem:   Persistent atrial fibrillation [facility]) Active Problems:   Tachycardia-bradycardia syndrome s/p PPM   Acute on chronic systolic CHF (congestive heart failure) ([place])   Demand ischemia ([place])   Diabetes mellitus type 2, uncomplicated ([place])   History of hypertension but hypotensive   Chronic kidney disease, stage 4 (severe) (H[facility])   Secondary hypercoagulable state ([place])   Pleural effusion   Persistent atrial fibrillation with rapid ventricular response ([facility])   Weakness   Nonsustained ventricular tachycardia ([place])    Diagnostic Studies/Procedures    Echocardiogram 04/21/2019: Impressions: 1. Septal apical distal anterior wall mid and apical inferior wall  hypokinesis Basal function preserved Abnormal GLS -6. Left ventricular  ejection fraction, by estimation, is 25%. The left ventricle has severely  decreased function. The left ventricle  demonstrates regional wall motion abnormalities (see scoring  diagram/findings for description). The left ventricular internal cavity  size was moderately to severely dilated. Left ventricular diastolic  parameters are indeterminate.  2. Right ventricular systolic function is normal. The right ventricular  size is normal. There is moderately elevated pulmonary artery systolic  pressure.  3. Left atrial size was mildly dilated.  4. The mitral valve is normal in structure and function. Mild mitral  valve regurgitation. No evidence of mitral stenosis.  5. The aortic valve is tricuspid. Aortic valve regurgitation is trivial.  Mild aortic valve sclerosis is present, with no evidence of aortic valve  stenosis.  6. The inferior vena cava is  dilated in size with >50% respiratory  variability, suggesting right atrial pressure of 8 mmHg. _______________  PPM Implant 05/21/2019: Conclusions: 1. Successful implantation of a St Jude Medical Assurity MRI dual-chamber pacemaker for symptomatic bradycardia 2. No early apparent complications.  _______________  Upper Extremity Ultrasound 05/26/2019: Summary:  - Right: No evidence of thrombosis in the subclavian.  - Left: No evidence of deep vein thrombosis in the upper extremity. No evidence of superficial vein thrombosis in the upper extremity.  _______________  AV Node Ablation 05/28/2019: Conclusions: 1. AFib with RVR upon presentation.  2. Successful AV nodal ablation 3. No early apparent complications.    History of Present Illness     Wesley Garcia is a 86 y.o. male with a history of persistent atrial fibrillation (diagnosed on 03/14/2019) s/p two recent DCCV on Xarelto and Amiodarone, hypertension, diabetes mellitus, CKD stage IV, squamous cell carcinoma of hard palate, and recent fall with hip fracture s/p surgical repair in 02/2019.  Patient initially diagnosed with atrial fibrillation on 03/14/2019 and was started on Xarelto.  He underwent successful DCCV on 04/07/2019 but unfortunately was noted to be back in atrial fibrillation at follow-up with PCP on 04/10/2019. He was started on Amiodarone and had repeat DCCV on 05/13/2019. However, on follow-up in [facility] on 05/20/2019 was noted to be back in atrial fibrillation with RVR. He was also hypotensive at this visit with systolic BP in the 36'P; therefore, he was sent to the ED for further management.   In the ED, he reported palpitations, weakness, and increased dyspnea on exertion while in atrial fibrillation. No shortness of breath at rest. He also noted some orthopnea and lower extremity edema. He endorsed some chest pain when in atrial fibrillation but  denied any at the time.   Hospital Course     Consultants:  EP, Diabetes Coordinator, PT, and Case Managment   Persistent Atrial Fibrillation with RVR /  Non-Sustained VT/ Tachy-brady Syndrome Patient admitted with persistent atrial fibrillation with RVR and hypotension. He did spontaneous convert to sinus bradycardia with rates in the high 40's to 60. Initial plan was for CRT-pacer but no ablation. He underwent PPM implantation on 05/21/2019 unfortunately there were no viable/acceptable veins to except the left atrial lead so this had to be removed but dual chamber PPM was placed. Unfortunately he went back into atrial fibrillation and was thus restarted on IV Amiodarone on 05/25/2019. He also had episode of near syncope associated with non-sustained VT when he got up from bed. IV Amiodarone was eventually able to be transitioned back to PO but patient continued to have intermittent episodes of atrial fibrillation with rates in the 100's to 120's. Therefore, decision was made to proceed with AV nodal ablation which was done on 05/28/2019. Patient tolerated this well. Amiodarone and Metoprolol discontinued after AV node ablation. Patient noted to have oozing with large ecchymosis from pacemaker implantation site; therefore, EP recommends holding Xarelto until after wound check on 06/05/2019.   Acute on Chronic Systolic CHF with Pleural Effusions BNP elevated at 1,671 on 3/16. Initial chest x-ray on admission showed moderate-sized bilateral pleural effusion with adjacent airspace opacities favored to represent compressive atelectasis. Repeat chest x-ray on 3/20 showed continued moderate sized bilateral pleural effusion. He was diuresed with IV Lasix but this ultimately had to be stopped on 3/22 due to rising creatinine. Given upper extremity swelling and recent PPM, upper extremity ultrasound was checked on 3/22 and showed no DVT. Net negative 7.5 L this admission. Discharge weight 140.8 lbs. Will plan to start Lasix 64m daily on 05/30/2019. Will continue K-Dur 20 mEq daily  with this which he has tolerated well the past few days. No ACEi/ARB due to renal function and soft BP. No beta-blocker due to soft BP. Continue to monitor daily weights after discharge and follow sodium and fluid restrictions (will give more instructions in discharge summary). Will need repeat BMET on 06/02/2019 at SNF.   Demand Ischemia High-sensitivity troponin minimally elevated and flat at 36 >> 34. Not consistent with ACS. Felt to be demand ischemia in setting of atrial fibrillation and hypotension.   Hypotensive Patient has a history hypertension but has had mild hypotension this admission. Most recent BP soft but stable at 105/56. Home Metoprolol discontinued.   Diabetes Hemoglobin A1c 6.9 in 04/2019. Diabetes coordinator consulted during admission. Restart home medications at discharge.   CKD Stage IV Creatinine 1.86 on admission. Baseline seems to be around 1.8 to 2.2. Peaked at 2.99 on 05/28/2019 and 2.88 today. Diuresis as above. Will need repeat BMET on 06/02/2019 at SNF.  Prolonged QTc Stable. QTc 572 ms on EKG today but QRS 172 ms with V-pacing. Amiodarone discontinued yesterday. Can repeat EKG at follow-up.   Functional Decline/Weakness PT consulted and recommended SNF on discharge. [facility]   Patient seen and examined by Dr. AMargaretann Lovelesstoday and determined to be stable for discharge. Outpatient follow-up has been arranged. Medications as below.  Did the patient have an acute coronary syndrome (MI, NSTEMI, STEMI, etc) this admission?:  No.   The elevated Troponin was due to the acute medical illness (demand ischemia).  _____________  Discharge Vitals Blood pressure 113/62, pulse 82, temperature 98 F (36.7 C), temperature source Oral, resp.  rate (!) 21, height 5' 6"  (1.676 m), weight 63.9 kg, SpO2 100 %.  Filed Weights   05/27/19 0458 05/28/19 0603 05/29/19 0551  Weight: 62.9 kg 64.6 kg 63.9 kg    Labs & Radiologic Studies      CBC Recent Labs    05/28/19 0925 05/29/19 0206  WBC 9.8 8.9  HGB 10.9* 10.2*  HCT 35.3* 33.1*  MCV 91.9 90.9  PLT 207 413   Basic Metabolic Panel Recent Labs    05/28/19 0925 05/29/19 0206  NA 135 135  K 4.6 4.6  CL 94* 94*  CO2 31 30  GLUCOSE 189* 179*  BUN 48* 50*  CREATININE 2.99* 2.88*  CALCIUM 8.5* 8.5*   Liver Function Tests No results for input(s): AST, ALT, ALKPHOS, BILITOT, PROT, ALBUMIN in the last 72 hours. No results for input(s): LIPASE, AMYLASE in the last 72 hours. High Sensitivity Troponin:   Recent Labs  Lab 05/20/19 1552 05/20/19 1955  TROPONINIHS 36* 34*    BNP Invalid input(s): POCBNP D-Dimer No results for input(s): DDIMER in the last 72 hours. Hemoglobin A1C No results for input(s): HGBA1C in the last 72 hours. Fasting Lipid Panel No results for input(s): CHOL, HDL, LDLCALC, TRIG, CHOLHDL, LDLDIRECT in the last 72 hours. Thyroid Function Tests No results for input(s): TSH, T4TOTAL, T3FREE, THYROIDAB in the last 72 hours.  Invalid input(s): FREET3 _____________  DG Chest 2 View  Result Date: 05/24/2019 CLINICAL DATA:  Pleural effusion. EXAM: CHEST - 2 VIEW COMPARISON:  Chest radiograph 05/22/2019 FINDINGS: Redemonstrated left chest dual lead AICD. Overlying cardiac monitoring leads. Borderline enlarged heart size, unchanged. Aortic atherosclerosis. Unchanged bilateral pleural effusions and associated bibasilar atelectasis. The upper lungs remain clear. No evidence of pneumothorax. No acute bony abnormality IMPRESSION: Stable examination as compared to 05/22/2019. Unchanged bilateral pleural effusions and associated bibasilar atelectasis. Electronically Signed   By: Kellie Simmering DO   On: 05/24/2019 09:41   DG Chest 2 View  Result Date: 05/22/2019 CLINICAL DATA:  Post cardiac device insertion EXAM: CHEST - 2 VIEW COMPARISON:  05/20/2019 FINDINGS: New LEFT subclavian transvenous pacemaker with leads projecting at RIGHT atrium and RIGHT  ventricle. Rotated to the LEFT. Borderline enlargement of cardiac silhouette. Mediastinal contours and pulmonary vascularity normal for technique. Bibasilar pleural effusions and atelectasis. Upper lungs clear. No definite infiltrate or pneumothorax. Bones demineralized. IMPRESSION: No pneumothorax following pacemaker insertion. BILATERAL pleural effusions and bibasilar atelectasis. Electronically Signed   By: Lavonia Dana M.D.   On: 05/22/2019 08:26   DG Chest 2 View  Result Date: 05/20/2019 CLINICAL DATA:  Short of breath. History of congestive heart failure. EXAM: CHEST - 2 VIEW COMPARISON:  Earlier same day FINDINGS: No discernible change. Bilateral pleural effusions with atelectasis in the lower lungs. Venous hypertension with mild interstitial edema. Upper lungs are reasonably well aerated. IMPRESSION: No change since earlier. Congestive heart failure. Bilateral effusions with lower lung atelectasis. Pulmonary venous hypertension with mild interstitial edema. Electronically Signed   By: Nelson Chimes M.D.   On: 05/20/2019 18:54   EP STUDY  Result Date: 05/28/2019 SURGEON: Thompson Grayer, MD PREPROCEDURE DIAGNOSIS: 1.  Medicine refractory persistent atrial fibrillation with uncontrolled ventricular rates, 2.  nonischemic cardiomyopathy with CHF (presumed due to tachycardia mediated CM) POSTPROCEDURE DIAGNOSIS: 1.  Medicine refractory persistent atrial fibrillation with uncontrolled ventricular rates, 2.  nonischemic cardiomyopathy with CHF (presumed due to tachycardia mediated CM) PROCEDURES: 1. His bundle recording and pacing 2. RV recording and pacing 3. AV nodal ablation 4. BiV  pacemaker interrogation and reprogramming INTRODUCTION:  Channing REIS GOGA is a 86 y.o. male with a history of symptomatic persistent atrial fibrillation with difficult to control ventricular rates who presents today for AV nodal ablation.  He is s/p recent pacemaker implantation by Dr Curt Bears.  Rate control efforts have been  difficult.   He has severe LA enlargement and has failed AAD therapy. DESCRIPTION OF PROCEDURE: Informed written consent was obtained and the patient was brought to the Electrophysiology Lab in the fasting state. The patient required no sedation for the procedure today. The patient's right groin was prepped and draped in the usual sterile fashion by the EP Lab staff. The patients pacemaker was interrogated and programmed VVI 34 bpm for the procedure.  Device lead pace/sense/impedance values were found to be stable. Direct Ultrasound guidance was used today for right common femoral venous access with normal vessel patency.  Ultrasound images are captured and stored in the patients chart. Using a percutaneous Seldinger technique, a 8 F hemostasis sheath was placed into the right common femoral vein. A 7 French Celsius 80m ablation catheter was advanced into the right ventrical for recording and pacing.  This catheter was then pulled back to the His bundle location.  Presenting Measurements: The patient presented to the Electrophysiology Lab in afib with RVR. The  QRS of 74 msec and a Qt of 398 msec. The average RR interval was 658 msec. The  HV interval was 63 milliseconds. Ablation: Ventricular pacing was performed.  The ablation catheter was advanced under fluoroscopic visualization into the area of the His bundle.  A large atrial signal with His and small V signal was identified.  A single RF lesion was delivered in this location with a target temp of 60 degrees at 50 watts for 60 seconds.  Accelerated junctional rhythm was observed followed by AV block and V pacing.  The patient was observed to have an underlying escape rhythm at 30 bpm.  He was observed for 20 minutes without return of conduction. The procedure was therefore considered completed. All catheters were removed and the sheaths were aspirated and flushed. The sheaths were removed and hemostasis was assured with perclose. EBL<175m There were no early  apparent complications.  The pacemaker was interrogated and lead pace/sense/ impedance values remained unchanged when compared to baseline.  Device was programmed VVIR 80 bpm. CONCLUSIONS: 1. AFib with RVR upon presentation. 2. Successful AV nodal ablation 3. No early apparent complications. JaThompson GrayerD, FAKips Bay Endoscopy [facility] LLC/24/2021 5:05 PM   EP PPM/ICD IMPLANT  Result Date: 05/21/2019 SURGEON:  WiAllegra LaiMD   PREPROCEDURE DIAGNOSIS:  Tachy/brady syndrome   POSTPROCEDURE DIAGNOSIS:  Tachy/brady syndrome    PROCEDURES:  1. Pacemaker implantation.  2. Left upper extremity venography.   INTRODUCTION:  Jasyn M AYUUB PENLEYs a [age].o. male with a history of bradycardia who presents today for pacemaker implantation.  The patient reports intermittent episodes of dizziness over the past few months.  No reversible causes have been identified.  The patient therefore presents today for pacemaker implantation.   DESCRIPTION OF PROCEDURE:  Informed written consent was obtained, and  the patient was brought to the electrophysiology lab in a fasting state.  The patient required no sedation for the procedure today.  The patients left chest was prepped and draped in the usual sterile fashion by the EP lab staff. The skin overlying the left deltopectoral region was infiltrated with lidocaine for local analgesia.  A 4-cm incision was made over the left deltopectoral region.  A left subcutaneous pacemaker pocket was fashioned using a combination of sharp and blunt dissection. Electrocautery was required to assure hemostasis.  Left Upper Extremity Venography: A venogram of the left upper extremity was performed, which revealed a large left cephalic vein, which emptied into a large left subclavian vein.  The left axillary vein was moderate in size.  RA/RV Lead Placement: The left axillary vein was therefore cannulated.  Through the left axillary vein, a [employer] model Tendril MRI [id number] (serial number  [id number]) right atrial lead and a  St Jude Medical model Tendril MRI [id number] (serial number  [id number]) right ventricular lead were advanced with fluoroscopic visualization into the right atrial appendage and right ventricular apex positions respectively.  Initial atrial lead P- waves measured 4.1 mV with impedance of 503 ohms and a threshold of 1.3 V at 0.5 msec.  Right ventricular lead R-waves measured 9.1 mV with an impedance of 823 ohms and a threshold of 1.4 V at 0.5 msec.  Both leads were secured to the pectoralis fascia using #2-0 silk over the suture sleeves. LV Lead Placement: A St. Jude 135 guide was advanced through the left axillary vein into the low lateral right atrium. A versicore wire was introduced through the 135 guide and used to cannulate the coronary sinus.  A selective coronary sinus venogram was performed by hand injection of nonionic contrast. This demonstrated a large CS body with very small/ atretic distal branches. There was a moderate sized lateral coronary sinus branch was noted along the mid portion of the CS body. No other posterior branches were identified. A Whisper CSJ wire was introduced through the transseptal sheath and advanced into the distal posterolateral branch. A St. Jude [id number]  (serial number [id number] ) lead was advanced through the guide into the lateral branch. This was approximately one-thirds from the base to the apex in a very lateral position.  With pacing in this area, the threshold was greater than 5 in multiple areas.  Multiple other veins were attempted, but no vein was stable enough to except the left atrial lead.  The left atrial lead was thus removed. Device Placement:  The leads were then connected to a [place] MRI  model [id number] (serial number  [id number] ) pacemaker.  The pocket was irrigated with copious gentamicin solution.  The pacemaker was then placed into the pocket.  The pocket was then closed in 3 layers with 2.0 Vicryl suture for the 3.0 Vicryl suture subcutaneous  and subcuticular layers.  Steri-  Strips and a sterile dressing were then applied. EBL<69m.  There were no early apparent complications.   CONCLUSIONS:  1. Successful implantation of a St Jude Medical Assurity MRI dual-chamber pacemaker for symptomatic bradycardia  2. No early apparent complications.       WAllegra Lai MD 05/21/2019 5:26 PM   DG Chest Port 1 View  Result Date: 05/20/2019 CLINICAL DATA:  Chest pain shortness of breath. EXAM: PORTABLE CHEST 1 VIEW COMPARISON:  04/16/2014 FINDINGS: There are moderate-sized bilateral pleural effusions with adjacent airspace opacities favored to represent compressive atelectasis. There is no convincing pneumothorax. There is a lucency along the left heart border which is felt to represent artifact. There is no acute osseous abnormality. The heart size appears borderline enlarged but is difficult to fully evaluate. Aortic calcifications are noted. IMPRESSION: Moderate-sized bilateral pleural effusions with adjacent airspace opacities favored to represent compressive atelectasis. Electronically Signed   By: CConstance HolsterM.D.   On: 05/20/2019 16:14  VAS [place] UPPER EXTREMITY VENOUS DUPLEX  Result Date: 05/26/2019 UPPER VENOUS STUDY  Indications: Swelling Comparison Study: no prior Performing Technologist: Abram Sander RVS  Examination Guidelines: A complete evaluation includes B-mode imaging, spectral Doppler, color Doppler, and power Doppler as needed of all accessible portions of each vessel. Bilateral testing is considered an integral part of a complete examination. Limited examinations for reoccurring indications may be performed as noted.  Right Findings: +----------+------------+---------+-----------+----------+-------+  RIGHT      Compressible Phasicity Spontaneous Properties Summary  +----------+------------+---------+-----------+----------+-------+  Subclavian     Full        Yes        Yes                          +----------+------------+---------+-----------+----------+-------+  Left Findings: +----------+------------+---------+-----------+----------+-------+  LEFT       Compressible Phasicity Spontaneous Properties Summary  +----------+------------+---------+-----------+----------+-------+  IJV            Full        Yes        Yes                         +----------+------------+---------+-----------+----------+-------+  Subclavian                 Yes        Yes                         +----------+------------+---------+-----------+----------+-------+  Axillary       Full        Yes        Yes                         +----------+------------+---------+-----------+----------+-------+  Brachial       Full        Yes        Yes                         +----------+------------+---------+-----------+----------+-------+  Radial         Full                                               +----------+------------+---------+-----------+----------+-------+  Ulnar          Full                                               +----------+------------+---------+-----------+----------+-------+  Cephalic       Full                                               +----------+------------+---------+-----------+----------+-------+  Basilic        Full                                               +----------+------------+---------+-----------+----------+-------+  Summary:  Right: No evidence  of thrombosis in the subclavian.  Left: No evidence of deep vein thrombosis in the upper extremity. No evidence of superficial vein thrombosis in the upper extremity.  *See table(s) above for measurements and observations.  Diagnosing physician: Curt Jews MD Electronically signed by Curt Jews MD on 05/26/2019 at 3:36:12 PM.    Final    Disposition   Patient is being discharged home today in good condition.  Follow-up Plans & Appointments     Contact information for follow-up providers    [facility] Office Follow up on 06/05/2019.     Specialty: Cardiology Why: You have a wound check appointment scheduled for 06/05/2019 at 4:00pm.  Contact information: [address], [address] [place]       Burtis Junes, NP Follow up.   Specialties: Nurse Practitioner, Interventional Cardiology, Cardiology, Radiology Why: You have a hospital follow-up scheduled for 06/10/2019 at 11:15am with Truitt Merle, NP. Please arrive 15 minutes early for check-in. Contact information: [place] [place] [address] [phone]            Contact information for after-discharge care    [facility] .   Service: Skilled Nursing Contact information: [address] [place] [phone]                 Discharge Instructions    Diet - low sodium heart healthy   Complete by: As directed    Increase activity slowly   Complete by: As directed       Discharge Medications   Allergies as of 05/29/2019      Reactions   Tape Other (See Comments)   SKIN IS THIN AND IT TEARS AND BRUISES VERY, VERY EASILY!! PLEASE USE AN ALTERNATIVE.      Medication List    STOP taking these medications   amiodarone 200 MG tablet Commonly known as: PACERONE   metoprolol tartrate 25 MG tablet Commonly known as: LOPRESSOR   Rivaroxaban 15 MG Tabs tablet Commonly known as: XARELTO     TAKE these medications   Accu-Chek Aviva Plus w/Device Kit 1 Units by Does not apply route as directed. Use as directed to check blood sugar. Dx:E11.22   Accu-Chek Aviva test strip Generic drug: glucose blood Use to test blood sugar three times daily. Dx:E11.22   Accu-Chek Softclix Lancets lancets 100 each by Other route 3 (three) times daily. Use as instructed Dx:E11.22   acetaminophen 500 MG tablet Commonly known as: TYLENOL Take 1,000 mg by mouth every 8 (eight) hours as needed for mild pain or headache.    Easy Comfort Pen Needles 31G X 5 MM Misc Generic drug: Insulin Pen Needle as directed.   furosemide 40 MG tablet Commonly known as: Lasix Take 1 tablet (40 mg total) by mouth daily. What changed:   medication strength  how much to take  how to take this  when to take this  additional instructions Notes to patient: Start Friday 05/30/2019.   linagliptin 5 MG Tabs tablet Commonly known as: Tradjenta Take 1 tablet (5 mg total) by mouth daily.   Ocuvite Adult 50+ Caps Take 1 capsule by mouth daily with breakfast.   pantoprazole 40 MG tablet Commonly known as: PROTONIX TAKE 1 TABLET (40 MG TOTAL) BY MOUTH 2 (TWO) TIMES DAILY BEFORE A MEAL.   potassium chloride SA 20 MEQ tablet Commonly known as: KLOR-CON Take 1 tablet (  20 mEq total) by mouth daily. Start taking on: May 30, 2019   tamsulosin 0.4 MG Caps capsule Commonly known as: FLOMAX Take 1 capsule (0.4 mg total) by mouth at bedtime.   Toujeo Max SoloStar 300 UNIT/ML Solostar Pen Generic drug: insulin glargine (2 Unit Dial) Inject 15 Units into the skin daily before breakfast.          Outstanding Labs/Studies   Repeat BMET on 06/02/2019.   Duration of Discharge Encounter   Greater than 30 minutes including physician time.  Signed, Darreld Mclean, PA-C 05/29/2019, 3:37 PM

## 2019-05-29 NOTE — Progress Notes (Signed)
Report given to receiving RN at [facility]. Pt ready for transportation, d/c paperwork completed.

## 2019-05-30 ENCOUNTER — Non-Acute Institutional Stay (SKILLED_NURSING_FACILITY): Payer: Medicare PPO | Admitting: Adult Health

## 2019-05-30 DIAGNOSIS — J90 Pleural effusion, not elsewhere classified: Secondary | ICD-10-CM | POA: Diagnosis not present

## 2019-05-30 DIAGNOSIS — Z794 Long term (current) use of insulin: Secondary | ICD-10-CM

## 2019-05-30 DIAGNOSIS — N184 Chronic kidney disease, stage 4 (severe): Secondary | ICD-10-CM

## 2019-05-30 DIAGNOSIS — K219 Gastro-esophageal reflux disease without esophagitis: Secondary | ICD-10-CM

## 2019-05-30 DIAGNOSIS — N400 Enlarged prostate without lower urinary tract symptoms: Secondary | ICD-10-CM

## 2019-05-30 DIAGNOSIS — I5023 Acute on chronic systolic (congestive) heart failure: Secondary | ICD-10-CM

## 2019-05-30 DIAGNOSIS — E1122 Type 2 diabetes mellitus with diabetic chronic kidney disease: Secondary | ICD-10-CM

## 2019-05-30 DIAGNOSIS — I495 Sick sinus syndrome: Secondary | ICD-10-CM | POA: Diagnosis not present

## 2019-05-30 DIAGNOSIS — I4819 Other persistent atrial fibrillation: Secondary | ICD-10-CM | POA: Diagnosis not present

## 2019-06-01 NOTE — Progress Notes (Signed)
Location:   [facility] Room Number: 597 Place of Service:  SNF (31)   CODE STATUS: dnr  Allergies  Allergen Reactions  . Tape Other (See Comments)    SKIN IS THIN AND IT TEARS AND BRUISES VERY, VERY EASILY!! PLEASE USE AN ALTERNATIVE.    Chief Complaint  Patient presents with  . Hospitalization Follow-up    HPI:  He is a 86 year old man who has been hospitalized from 05-20-19 through 3-35-21. He was hospitalized for persistent fib with rvr; nonsustained v-tach; tachy-brady syndrome. He had a pace maker placed on 05-21-19; and av node ablation done on 05-28-19. He has stage 4 ckd is off ace/arb due to renal function and soft blood pressure readings. He as treated for acute on chronic systolic heart failure and bilateral pleural effusions. He is here for short term rehab with his goal to return back home. He denies any uncontrolled pain; does not have a good appetite; is tired. He will continue to be followed for his chronic illnesses including: chf; afib; diabetes.   Past Medical History:  Diagnosis Date  . Chronic kidney disease (CKD), stage III (moderate)   . Diabetes mellitus   . Diverticulosis   . Hard of hearing   . Hemorrhoids   . Hx of adenomatous colonic polyps 2006  . Hypertension   . Persistent atrial fibrillation ([place]) 03/2019  . Pleural effusion 05/20/2019  . Presence of permanent cardiac pacemaker 05/21/2019  . 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 AV NODE ABLATION N/A 05/28/2019   Procedure: AV NODE ABLATION;  Surgeon: Thompson Grayer, MD;  Location: [place] [facility];  Service: Cardiovascular;  Laterality: N/A;  . CARDIOVERSION N/A 04/07/2019   Procedure: CARDIOVERSION;  Surgeon: Josue Hector, MD;  Location: [facility];  Service: Cardiovascular;  Laterality: N/A;  . CARDIOVERSION N/A 05/13/2019   Procedure: CARDIOVERSION;  Surgeon: Skeet Latch, MD;  Location: [place];   Service: Cardiovascular;  Laterality: N/A;  . CHOLECYSTECTOMY N/A 04/17/2014   Procedure: LAPAROSCOPIC CHOLECYSTECTOMY WITH INTRAOPERATIVE CHOLANGIOGRAM;  Surgeon: Erroll Luna, MD;  Location: [place];  Service: General;  Laterality: N/A;  . COLONOSCOPY  11/02/2007  . INSERT / REPLACE / REMOVE PACEMAKER    . MAXILLECTOMY Right 2003   SCCA alveolar ridge  . PACEMAKER IMPLANT N/A 05/21/2019   Procedure: PACEMAKER IMPLANT;  Surgeon: Constance Haw, MD;  Location: [place] [facility];  Service: Cardiovascular;  Laterality: N/A;    Social History   Socioeconomic History  . Marital status: Married    Spouse name: Not on file  . Number of children: 5  . Years of education: Not on file  . Highest education level: Not on file  Occupational History  . Occupation: Retired  Tobacco Use  . Smoking status: Former Smoker    Types: Cigars    Quit date: 04/07/2000    Years since quitting: 19.1  . Smokeless tobacco: Never Used  Substance and Sexual Activity  . Alcohol use: Not Currently    Comment: socially  . Drug use: No  . Sexual activity: Not Currently  Other Topics Concern  . Not on file  Social History Narrative   Retired, married   Social Determinants of Health   Financial Resource Strain:   . Difficulty of Paying Living Expenses:   Food Insecurity:   . Worried About Charity fundraiser in the Last Year:   .  Ran Out of Food in the Last Year:   Transportation Needs:   . Film/video editor (Medical):   Marland Kitchen Lack of Transportation (Non-Medical):   Physical Activity:   . Days of Exercise per Week:   . Minutes of Exercise per Session:   Stress:   . Feeling of Stress :   Social Connections:   . Frequency of Communication with Friends and Family:   . Frequency of Social Gatherings with Friends and Family:   . Attends Religious Services:   . Active Member of Clubs or Organizations:   . Attends Archivist Meetings:   Marland Kitchen Marital Status:   Intimate Partner Violence:     . Fear of Current or Ex-Partner:   . Emotionally Abused:   Marland Kitchen Physically Abused:   . Sexually Abused:    Family History  Problem Relation Age of Onset  . Cancer Mother        ?  Marland Kitchen Bone cancer Sister   . Diabetes Brother   . Kidney disease Sister   . Colon cancer Neg Hx       VITAL SIGNS BP 129/76   Pulse 80   Temp (!) 97.5 F (36.4 C)   Resp 20   Ht _0  (1.676 m)   Wt 136 lb 4.8 oz (61.8 kg)   BMI 22.00 kg/m   Outpatient Encounter Medications as of 05/30/2019  Medication Sig Note  . ACCU-CHEK SOFTCLIX LANCETS lancets 100 each by Other route 3 (three) times daily. Use as instructed Dx:E11.22   . acetaminophen (TYLENOL) 500 MG tablet Take 1,000 mg by mouth every 8 (eight) hours as needed for mild pain or headache.  05/20/2019: Not taken often  . Blood Glucose Monitoring Suppl (ACCU-CHEK AVIVA PLUS) w/Device KIT 1 Units by Does not apply route as directed. Use as directed to check blood sugar. Dx:E11.22   . EASY COMFORT PEN NEEDLES 31G X 5 MM MISC as directed.    . furosemide (LASIX) 40 MG tablet Take 1 tablet (40 mg total) by mouth daily.   Marland Kitchen glucose blood (ACCU-CHEK AVIVA) test strip Use to test blood sugar three times daily. Dx:E11.22   . insulin glargine, 2 Unit Dial, (TOUJEO MAX SOLOSTAR) 300 UNIT/ML Solostar Pen Inject 15 Units into the skin daily before breakfast. 05/20/2019: Regimen confirmed to be accurate by the patient's daughter-in-law  . linagliptin (TRADJENTA) 5 MG TABS tablet Take 1 tablet (5 mg total) by mouth daily.   . Multiple Vitamins-Minerals (OCUVITE ADULT 50+) CAPS Take 1 capsule by mouth daily with breakfast.    . pantoprazole (PROTONIX) 40 MG tablet TAKE 1 TABLET (40 MG TOTAL) BY MOUTH 2 (TWO) TIMES DAILY BEFORE A MEAL.   Marland Kitchen potassium chloride SA (KLOR-CON) 20 MEQ tablet Take 1 tablet (20 mEq total) by mouth daily.   . tamsulosin (FLOMAX) 0.4 MG CAPS capsule Take 1 capsule (0.4 mg total) by mouth at bedtime.    No facility-administered encounter  medications on file as of 05/30/2019.     SIGNIFICANT DIAGNOSTIC EXAMS  TODAY;   05-20-19: chest x-ray: Moderate-sized bilateral pleural effusions with adjacent airspace opacities favored to represent compressive atelectasis.  05-24-19: chest x-ray: Stable examination as compared to 05/22/2019. Unchanged bilateral pleural effusions and associated bibasilar atelectasis  TODAY  04-10-19: hgb a1c 6.9 05-20-19: wbc 8.1; hgb 14.4; hct 46.0; mcv 92. 6plt 253; glucose 118; bun 25; creat 1.86; k+ 3.4; na++ 139; ca 8.6; liver normal albumin 2.8; blood and urine culture: no growth; BNP 1671.4  05-22-19: glucose 138; bun 21; creat 1.98; k+ 4.1; na++ 139; ca 8.3 05-24-19: glucose 78; bun 24; creat 2.16; k+ 3.7; na++ 137; ca 8.5 mag 1.9 05-26-19: glucose 122; bun 33; creat 2.37; k+ 5.3; na++ 134; ca 8.5; mag 2.1 05-28-19: wbc 9.8; hgb 10.9; hct 35.3; mcv 91.9 plt 207; glucose 189; bun 48; creat 2.99; k+ 4.6; na++ 135; ca 8.5 05-29-19: wbc 8.9; hgb 10.2; hct 33.1; mcv 90.9 plt 223; glucose 179; bun 50; creat 2.88; k+ 4.6; na++ 135; ca 8.5   Review of Systems  Constitutional: Negative for malaise/fatigue.       Poor appetite   Respiratory: Negative for cough and shortness of breath.   Cardiovascular: Negative for chest pain, palpitations and leg swelling.  Gastrointestinal: Negative for abdominal pain, constipation and heartburn.  Musculoskeletal: Negative for back pain, joint pain and myalgias.  Skin: Negative.   Neurological: Negative for dizziness.  Psychiatric/Behavioral: Positive for depression. The patient is not nervous/anxious.     Physical Exam Constitutional:      General: He is not in acute distress.    Appearance: He is well-developed. He is not diaphoretic.  Neck:     Thyroid: No thyromegaly.  Cardiovascular:     Rate and Rhythm: Normal rate. Rhythm irregular.     Pulses: Normal pulses.     Heart sounds: Normal heart sounds.  Pulmonary:     Effort: Pulmonary effort is normal. No  respiratory distress.     Breath sounds: Normal breath sounds.  Abdominal:     General: Bowel sounds are normal. There is no distension.     Palpations: Abdomen is soft.     Tenderness: There is no abdominal tenderness.  Musculoskeletal:        General: Normal range of motion.     Cervical back: Neck supple.     Right lower leg: No edema.     Left lower leg: No edema.  Lymphadenopathy:     Cervical: No cervical adenopathy.  Skin:    General: Skin is warm and dry.  Neurological:     Mental Status: He is alert and oriented to person, place, and time.     Comments: Has bilateral tremor present   Psychiatric:        Mood and Affect: Mood normal.        ASSESSMENT/ PLAN:  TODAY  1. Acute on chronic systolic congestive heart failure/bilateral pleural effusions: is stable EF 25% (04-21-19) will continue lasix 40 mg daily with k+ 20 meq daily will monitor is off lopressor due to blood pressure readings.   2. Persistent atrial fibrillation with rapid ventricular response/tachy-brady syndrome s/p PPM: heart rate is stable is off amiodarone and lopressor  is status post ablation; is off xarelto until pace maker check is done.   3. Type 2 diabetes mellitus with stage 4 chronic kidney disease with long term insulin use: is stable hgb a1c 6.9 will continue trajenta 5 mg daily and toujeo 15 units daily is off ace/arb due to renal function  4. CKD stage 4 due to type 2 diabetes mellitus: is without change bun 50 creat 2.88   5. GERD without esophagitis: is stable will continue protonix 40 mg twice daily   6. BPH without urinary obstruction: is stable will continue flomax 0.4 mg daily  Will check BMP    MD is aware of resident's narcotic use and is in agreement with current plan of care. We will attempt to wean resident as appropriate.  Neoma Laming  Jase Himmelberger NP [facility]  Contact [phone] Monday through Friday 8am- 5pm  After hours call [phone]

## 2019-06-02 ENCOUNTER — Other Ambulatory Visit (HOSPITAL_COMMUNITY)
Admission: RE | Admit: 2019-06-02 | Discharge: 2019-06-02 | Disposition: A | Discharge status: Home / Self Care | Payer: Medicare PPO | Source: Skilled Nursing Facility | Attending: Adult Health | Admitting: Adult Health

## 2019-06-02 DIAGNOSIS — I130 Hypertensive heart and chronic kidney disease with heart failure and stage 1 through stage 4 chronic kidney disease, or unspecified chronic kidney disease: Secondary | ICD-10-CM | POA: Insufficient documentation

## 2019-06-02 LAB — BASIC METABOLIC PANEL
Anion gap: 8 (ref 5–15)
BUN: 39 mg/dL — ABNORMAL HIGH (ref 8–23)
CO2: 29 mmol/L (ref 22–32)
Calcium: 8.5 mg/dL — ABNORMAL LOW (ref 8.9–10.3)
Chloride: 100 mmol/L (ref 98–111)
Creatinine, Ser: 2.1 mg/dL — ABNORMAL HIGH (ref 0.61–1.24)
GFR calc Af Amer: 32 mL/min — ABNORMAL LOW (ref >60)
GFR calc non Af Amer: 27 mL/min — ABNORMAL LOW (ref >60)
Glucose, Bld: 171 mg/dL — ABNORMAL HIGH (ref 70–99)
Potassium: 4.3 mmol/L (ref 3.5–5.1)
Sodium: 137 mmol/L (ref 135–145)

## 2019-06-03 ENCOUNTER — Ambulatory Visit: Payer: Medicare PPO

## 2019-06-03 DIAGNOSIS — N184 Chronic kidney disease, stage 4 (severe): Secondary | ICD-10-CM | POA: Insufficient documentation

## 2019-06-03 DIAGNOSIS — N400 Enlarged prostate without lower urinary tract symptoms: Secondary | ICD-10-CM | POA: Insufficient documentation

## 2019-06-03 DIAGNOSIS — K219 Gastro-esophageal reflux disease without esophagitis: Secondary | ICD-10-CM | POA: Insufficient documentation

## 2019-06-03 DIAGNOSIS — E1122 Type 2 diabetes mellitus with diabetic chronic kidney disease: Secondary | ICD-10-CM | POA: Insufficient documentation

## 2019-06-04 ENCOUNTER — Encounter: Payer: Self-pay | Admitting: Internal Medicine

## 2019-06-04 ENCOUNTER — Non-Acute Institutional Stay (SKILLED_NURSING_FACILITY): Payer: Medicare PPO | Admitting: Internal Medicine

## 2019-06-04 ENCOUNTER — Other Ambulatory Visit: Payer: Self-pay | Admitting: Internal Medicine

## 2019-06-04 DIAGNOSIS — E119 Type 2 diabetes mellitus without complications: Secondary | ICD-10-CM

## 2019-06-04 DIAGNOSIS — Z794 Long term (current) use of insulin: Secondary | ICD-10-CM

## 2019-06-04 DIAGNOSIS — I495 Sick sinus syndrome: Secondary | ICD-10-CM

## 2019-06-04 DIAGNOSIS — J90 Pleural effusion, not elsewhere classified: Secondary | ICD-10-CM

## 2019-06-04 DIAGNOSIS — N179 Acute kidney failure, unspecified: Secondary | ICD-10-CM

## 2019-06-04 DIAGNOSIS — I5023 Acute on chronic systolic (congestive) heart failure: Secondary | ICD-10-CM

## 2019-06-04 DIAGNOSIS — E1122 Type 2 diabetes mellitus with diabetic chronic kidney disease: Secondary | ICD-10-CM

## 2019-06-04 DIAGNOSIS — I4891 Unspecified atrial fibrillation: Secondary | ICD-10-CM

## 2019-06-04 DIAGNOSIS — I499 Cardiac arrhythmia, unspecified: Secondary | ICD-10-CM

## 2019-06-04 DIAGNOSIS — I472 Ventricular tachycardia: Secondary | ICD-10-CM

## 2019-06-04 DIAGNOSIS — N400 Enlarged prostate without lower urinary tract symptoms: Secondary | ICD-10-CM

## 2019-06-04 DIAGNOSIS — K219 Gastro-esophageal reflux disease without esophagitis: Secondary | ICD-10-CM

## 2019-06-04 DIAGNOSIS — N184 Chronic kidney disease, stage 4 (severe): Secondary | ICD-10-CM

## 2019-06-04 DIAGNOSIS — I4729 Other ventricular tachycardia: Secondary | ICD-10-CM

## 2019-06-04 DIAGNOSIS — R9431 Abnormal electrocardiogram [ECG] [EKG]: Secondary | ICD-10-CM

## 2019-06-04 DIAGNOSIS — I4819 Other persistent atrial fibrillation: Secondary | ICD-10-CM | POA: Diagnosis not present

## 2019-06-04 NOTE — Progress Notes (Signed)
: Provider:  Hennie Duos., MD Location:  [place] Room Number: 155-P Place of Service:  SNF (31)  PCP: Gayland Curry, DO Patient Care Team: Gayland Curry, DO as PCP - General ([place]) Jettie Booze, MD as PCP - Cardiology (Cardiology) Clent Jacks, MD as Consulting Physician (Ophthalmology)  Extended Emergency Contact Information Primary Emergency Contact: sadiel, mota Mobile Phone: [phone] Relation: Daughter Interpreter needed? No Secondary Emergency Contact: Dier,Gaynell Address: [address], [place] [address] [place] Phone: [phone] Relation: Spouse     Allergies: Tape  Chief Complaint  Patient presents with  . New Admit To SNF    New admission to [facility]    HPI: Patient is an 86 y.o. male status post DCCV on Xarelto on amiodarone, hypertension, diabetes mellitus type 2, CKD stage IV, status of questionable heart failure, and recent hip fracture status post surgical repair in 02/2019.  Patient initially diagnosed with atrial fibrillation in 03/2019 and was started on Xarelto and underwent successful DCCV on 2/1 and again on 2/4 and again on 3/9 after being started on amiodarone.  However on follow-up in atrial fibrillation clinic patient was noted to be back in atrial flutter with RVR and was hypotensive so he was sent to [facility], ED for further management.  Patient was noted to have some orthopnea and lower extremity edema but no other symptoms.  Patient was admitted to [facility] from 2016/25 where he was treated for atrial fibrillation with RVR along with nonsustained V. tach and tachybradycardia syndrome and underwent PPM implantation on 3/17 and after nonsustained V. tach patient underwent AV nodal ablation which was done on 3/24.  Patient Xarelto was held because of large ecchymosis from pacemaker implantation site.  Patient also had acute on chronic  systolic congestive heart failure with bilateral effusions.  Patient has some upper extremity swelling but ultrasound showed no acute DVT.  Patient is net -7.5 with this admission.  Patient is amiodarone and metoprolol was DC'd during this admission.  Creatinine was 1.86 on admission it seems to be about baseline, peaked at 2.9 secondary to IV Lasix diuresis and is 2.88 at discharge.  Patient also noted to have prolonged QT skilled nursing facility for OT/PT.  While at skilled nursing facility patient will be followed for diabetes mellitus treated with linagliptin and Toujeo, BPH.  Treated with Flomax and GERD treated with Protonix.   Past Medical History:  Diagnosis Date  . Chronic kidney disease (CKD), stage III (moderate)   . Diabetes mellitus   . Diverticulosis   . Hard of hearing   . Hemorrhoids   . Hx of adenomatous colonic polyps 2006  . Hypertension   . Persistent atrial fibrillation ([place]) 03/2019  . Pleural effusion 05/20/2019  . Presence of permanent cardiac pacemaker 05/21/2019  . 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 AV NODE ABLATION N/A 05/28/2019   Procedure: AV NODE ABLATION;  Surgeon: Thompson Grayer, MD;  Location: [place] CV LAB;  Service: Cardiovascular;  Laterality: N/A;  . CARDIOVERSION N/A 04/07/2019   Procedure: CARDIOVERSION;  Surgeon: Josue Hector, MD;  Location: [facility] ENDOSCOPY;  Service: Cardiovascular;  Laterality: N/A;  . CARDIOVERSION N/A 05/13/2019   Procedure: CARDIOVERSION;  Surgeon: Skeet Latch, MD;  Location: [place];  Service: Cardiovascular;  Laterality: N/A;  . CHOLECYSTECTOMY  N/A 04/17/2014   Procedure: LAPAROSCOPIC CHOLECYSTECTOMY WITH INTRAOPERATIVE CHOLANGIOGRAM;  Surgeon: Erroll Luna, MD;  Location: [place];  Service: General;  Laterality: N/A;  . COLONOSCOPY  11/02/2007  . INSERT / REPLACE / REMOVE PACEMAKER    . MAXILLECTOMY Right 2003   SCCA alveolar ridge  .  PACEMAKER IMPLANT N/A 05/21/2019   Procedure: PACEMAKER IMPLANT;  Surgeon: Constance Haw, MD;  Location: [place] CV LAB;  Service: Cardiovascular;  Laterality: N/A;    Allergies as of 06/04/2019      Reactions   Tape Other (See Comments)   SKIN IS THIN AND IT TEARS AND BRUISES VERY, VERY EASILY!! PLEASE USE AN ALTERNATIVE.      Medication List    Notice   This visit is during an admission. Changes to the med list made in this visit will be reflected in the After Visit Summary of the admission.    Current Outpatient Medications on File Prior to Visit  Medication Sig Dispense Refill  . acetaminophen (TYLENOL) 500 MG tablet Take 1,000 mg by mouth every 8 (eight) hours as needed for mild pain or headache.     Roseanne Kaufman Peru-Castor Oil (VENELEX) OINT Apply 1 application topically in the morning, at noon, and at bedtime. Apply to shear left buttock each shift    . feeding supplement, GLUCERNA SHAKE, (GLUCERNA SHAKE) LIQD Take 237 mLs by mouth 2 (two) times daily between meals.    . furosemide (LASIX) 40 MG tablet Take 1 tablet (40 mg total) by mouth daily. 30 tablet 2  . insulin glargine, 2 Unit Dial, (TOUJEO MAX SOLOSTAR) 300 UNIT/ML Solostar Pen Inject 15 Units into the skin daily before breakfast.    . linagliptin (TRADJENTA) 5 MG TABS tablet Take 1 tablet (5 mg total) by mouth daily. 90 tablet 1  . magnesium hydroxide (MILK OF MAGNESIA) 400 MG/5ML suspension Take 30 mLs by mouth daily as needed for mild constipation.    . Multiple Vitamins-Minerals (OCUVITE ADULT 50+) CAPS Take 1 capsule by mouth daily with breakfast.     . pantoprazole (PROTONIX) 40 MG tablet TAKE 1 TABLET (40 MG TOTAL) BY MOUTH 2 (TWO) TIMES DAILY BEFORE A MEAL. 180 tablet 0  . potassium chloride SA (KLOR-CON) 20 MEQ tablet Take 1 tablet (20 mEq total) by mouth daily. 30 tablet 1  . tamsulosin (FLOMAX) 0.4 MG CAPS capsule Take 1 capsule (0.4 mg total) by mouth at bedtime. 90 capsule 1  . ACCU-CHEK SOFTCLIX  LANCETS lancets 100 each by Other route 3 (three) times daily. Use as instructed Dx:E11.22 100 each 3  . Blood Glucose Monitoring Suppl (ACCU-CHEK AVIVA PLUS) w/Device KIT 1 Units by Does not apply route as directed. Use as directed to check blood sugar. Dx:E11.22 1 kit 0  . EASY COMFORT PEN NEEDLES 31G X 5 MM MISC as directed.   11  . glucose blood (ACCU-CHEK AVIVA) test strip Use to test blood sugar three times daily. Dx:E11.22 (Patient not taking: Reported on 06/04/2019) 300 each 3   No current facility-administered medications on file prior to visit.     No orders of the defined types were placed in this encounter.   Immunization History  Administered Date(s) Administered  . Fluad Quad(high Dose 65+) 11/21/2018  . Influenza, High Dose Seasonal PF 12/11/2016, 12/24/2017  . Influenza,inj,Quad PF,6+ Mos 12/16/2012, 12/04/2013  . Influenza-Unspecified 12/05/2015  . PFIZER SARS-COV-2 Vaccination 05/01/2019  . Pneumococcal Conjugate-13 03/06/2010, 09/01/2013  . Pneumococcal Polysaccharide-23 08/14/2016  . Tdap 04/17/2011  .  Zoster 08/16/2010  . Zoster Recombinat (Shingrix) 12/24/2017    Social History   Tobacco Use  . Smoking status: Former Smoker    Types: Cigars    Quit date: 04/07/2000    Years since quitting: 19.1  . Smokeless tobacco: Never Used  Substance Use Topics  . Alcohol use: Not Currently    Comment: socially    Family history is   Family History  Problem Relation Age of Onset  . Cancer Mother        ?  Marland Kitchen Bone cancer Sister   . Diabetes Brother   . Kidney disease Sister   . Colon cancer Neg Hx       Review of Systems   GENERAL:  no fevers, fatigue, appetite changes SKIN: No itching, or rash EYES: No eye pain, redness, discharge EARS: No earache, tinnitus, change in hearing NOSE: No congestion, drainage or bleeding  MOUTH/THROAT: No mouth or tooth pain, No sore throat RESPIRATORY: No cough, wheezing, SOB CARDIAC: No chest pain, palpitations, lower  extremity edema  GI: No abdominal pain, No N/V/D or constipation, No heartburn or reflux  GU: No dysuria, frequency or urgency, or incontinence  MUSCULOSKELETAL: No unrelieved bone/joint pain NEUROLOGIC: No headache, dizziness or focal weakness PSYCHIATRIC: No c/o anxiety or sadness   Vitals:   06/04/19 1451  BP: 97/66  Pulse: 78  Resp: 20  Temp: 98.3 F (36.8 C)  SpO2: 95%    SpO2 Readings from Last 1 Encounters:  06/04/19 95%   Body mass index is 21.95 kg/m.     Physical Exam  GENERAL APPEARANCE: Alert, conversant,  No acute distress.  SKIN: No diaphoresis rash HEAD: Normocephalic, atraumatic  EYES: Conjunctiva/lids clear. Pupils round, reactive. EOMs intact.  EARS: External exam WNL, canals clear. Hearing grossly normal.  NOSE: No deformity or discharge.  MOUTH/THROAT: Lips w/o lesions  RESPIRATORY: Breathing is even, unlabored. Lung sounds are clear   CARDIOVASCULAR: Heart regular no murmurs, rubs or gallops. No peripheral edema.   GASTROINTESTINAL: Abdomen is soft, non-tender, not distended w/ normal bowel sounds. GENITOURINARY: Bladder non tender, not distended  MUSCULOSKELETAL: No abnormal joints or musculature NEUROLOGIC:  Cranial nerves 2-12 grossly intact. Moves all extremities  PSYCHIATRIC: Mood and affect appropriate to situation, no behavioral issues  Patient Active Problem List   Diagnosis Date Noted  . CKD stage 4 due to type 2 diabetes mellitus ([place]) 06/03/2019  . GERD without esophagitis 06/03/2019  . Type 2 diabetes mellitus with stage 4 chronic kidney disease, with long-term current use of insulin ([place]) 06/03/2019  . BPH without urinary obstruction 06/03/2019  . Demand ischemia ([place]) 05/29/2019  . Hypotension 05/24/2019  . History of pacemaker 05/24/2019  . Weakness 05/24/2019  . Nonsustained ventricular tachycardia ([place]) 05/24/2019  . Acute on chronic systolic CHF (congestive heart failure) ([place]) 05/24/2019  . Pleural effusion 05/20/2019  .  Persistent atrial fibrillation with rapid ventricular response ([place]) 05/20/2019  . Secondary hypercoagulable state ([place]) 03/25/2019  . Pressure injury of sacral region, stage 2 ([place]) 03/14/2019  . Acute prostatitis 03/14/2019  . S/P right hip fracture 03/14/2019  . Irregular heart beat 03/14/2019  . Persistent atrial fibrillation ([place]) 03/14/2019  . Depression, major, single episode, mild ([place]) 05/02/2018  . Balance problem 12/24/2017  . Falls frequently 12/24/2017  . Aortic atherosclerosis ([place]) 01/01/2017  . Chronic kidney disease, stage 4 (severe) ([place]) 08/14/2016  . Exudative age-related macular degeneration of left eye ([place]) 08/14/2016  . Tachycardia-bradycardia syndrome s/p PPM 08/14/2016  . Localized osteoarthritis  of left knee 08/14/2016  . History of hypertension but hypotensive 08/14/2016  . Diabetes mellitus type 2, uncomplicated ([place]) 28/36/6294  . Hyperlipidemia associated with type 2 diabetes mellitus ([place]) 06/10/2012  . Hypertensive kidney disease, benign(403.1) 06/10/2012  . Personal history of colonic adenomas 09/29/2004      Labs reviewed: Basic Metabolic Panel:    Component Value Date/Time   NA 137 06/02/2019 0819   NA 140 08/08/2017 0000   K 4.3 06/02/2019 0819   CL 100 06/02/2019 0819   CO2 29 06/02/2019 0819   GLUCOSE 171 (H) 06/02/2019 0819   BUN 39 (H) 06/02/2019 0819   BUN 31 (A) 08/08/2017 0000   CREATININE 2.10 (H) 06/02/2019 0819   CREATININE 1.94 (H) 04/10/2019 1407   CALCIUM 8.5 (L) 06/02/2019 0819   PROT 6.0 (L) 05/20/2019 1955   PROT 7.0 12/03/2014 0802   ALBUMIN 2.8 (L) 05/20/2019 1955   ALBUMIN 4.4 12/03/2014 0802   AST 23 05/20/2019 1955   ALT 14 05/20/2019 1955   ALKPHOS 74 05/20/2019 1955   BILITOT 0.9 05/20/2019 1955   BILITOT 0.7 12/03/2014 0802   GFRNONAA 27 (L) 06/02/2019 0819   GFRNONAA 28 (L) 10/01/2018 0848   GFRAA 32 (L) 06/02/2019 0819   GFRAA 32 (L) 10/01/2018 0848    Recent Labs    05/24/19 0232 05/24/19 0232  05/25/19 0600 05/25/19 1548 05/26/19 0413 05/27/19 0313 05/28/19 0925 05/29/19 0206 06/02/19 0819  NA 137   < > 134*   < > 134*   < > 135 135 137  K 3.7   < > 3.9   < > 5.3*   < > 4.6 4.6 4.3  CL 99   < > 97*   < > 94*   < > 94* 94* 100  CO2 25   < > 25   < > 28   < > 31 30 29   GLUCOSE 78   < > 225*   < > 122*   < > 189* 179* 171*  BUN 27*   < > 31*   < > 33*   < > 48* 50* 39*  CREATININE 2.16*   < > 2.26*   < > 2.37*   < > 2.99* 2.88* 2.10*  CALCIUM 8.5*   < > 8.2*   < > 8.5*   < > 8.5* 8.5* 8.5*  MG 1.9  --  1.8  --  2.1  --   --   --   --    < > = values in this interval not displayed.   Liver Function Tests: Recent Labs    10/01/18 0848 05/20/19 1955  AST 15 23  ALT 12 14  ALKPHOS  --  74  BILITOT 0.6 0.9  PROT 7.0 6.0*  ALBUMIN  --  2.8*   No results for input(s): LIPASE, AMYLASE in the last 8760 hours. No results for input(s): AMMONIA in the last 8760 hours. CBC: Recent Labs    10/01/18 0848 10/01/18 0848 03/10/19 1342 03/28/19 1137 04/10/19 1407 05/05/19 1500 05/20/19 1552 05/28/19 0925 05/29/19 0206  WBC 9.5   < > 22.1*   < > 6.5   < > 8.1 9.8 8.9  NEUTROABS 6,251  --  19,360*  --  4,680  --   --   --   --   HGB 14.3   < > 10.1*   < > 12.1*   < > 14.4 10.9* 10.2*  HCT 41.6   < > 30.7*   < >  37.9*   < > 46.0 35.3* 33.1*  MCV 92.9   < > 95.6   < > 92.7   < > 92.6 91.9 90.9  PLT 204   < > 561*   < > 316   < > 253 207 223   < > = values in this interval not displayed.   Lipid Recent Labs    10/01/18 0848  CHOL 86  HDL 47  LDLCALC 25  TRIG 49    Cardiac Enzymes: No results for input(s): CKTOTAL, CKMB, CKMBINDEX, TROPONINI in the last 8760 hours. BNP: Recent Labs    05/20/19 1955  BNP 1,671.4*   Lab Results  Component Value Date   MICROALBUR 1.4 05/02/2018   Lab Results  Component Value Date   HGBA1C 6.9 (H) 04/10/2019   Lab Results  Component Value Date   TSH 6.636 (H) 05/20/2019   No results found for: VITAMINB12 No results found  for: FOLATE No results found for: IRON, TIBC, FERRITIN  Imaging and Procedures obtained prior to SNF admission: No results found.   Not all labs, radiology exams or other studies done during hospitalization come through on my EPIC note; however they are reviewed by me.    Assessment and Plan  Persistent atrial fibrillation with RVR-he did spontaneously convert to sinus bradycardia with rates in the 40s to 60s, he underwent PPM implantation on 3/17 but this had to be replaced with a dual-chamber PPM.  Unfortunately he went back to atrial fibrillation and thus restarted on IV amiodarone then had a run of nonsustained V. tach and IV amiodarone was converted back to p.o. but patient continued to have intermittent episodes of A. fib therefore decision was made to proceed with AV nodal ablation which was done on 3/24.  Amiodarone and metoprolol were discontinued after ablation.  Xarelto was held as patient was noted to have a large ecchymosis from pacemaker implantation site SNF-admitted for OT/PT; no Xarelto until wound check on 4/1  Acute on chronic systolic congestive heart failure/pleural effusions-BNP was elevated to 1671 on 3/16 with chest x-ray showing moderate sized bilateral pleural effusion.  Patient was diuresed with IV Lasix but this ultimately had to be stopped due to creatinine going 2.99.  Patient had upper extremity swelling and ultrasound was done which were negative.  Patient had a net negative of 7.5 L this admission and discharge weight was 140.8 pounds.  No ACE/ARB due to renal function and no beta-blocker due to soft BP SNF-sodium and fluid restriction; follow-up BMP on 3/29  Diabetes mellitus SNF-A1c 6.9 which is well controlled continue linagliptin 5 mg daily and Toujeo 15 units in the skin before breakfast  Acute renal failure on CKD stage IV-admission creatinine 1.86, peaked at 2.99 with IV Lasix and is 2.88 on discharge  Prolonged QT-QTC is 572; amiodarone had been  discontinued  BPH SNF-not stated as uncontrolled; continue Flomax 0.4 mg daily  GERD SNF-status uncontrolled; continue Protonix 40 mg twice daily   Time spent greater than 45 minutes;> 50% of time with patient was spent reviewing records, labs, tests and studies, counseling and developing plan of care  Hennie Duos, MD

## 2019-06-05 ENCOUNTER — Telehealth: Payer: Self-pay | Admitting: *Deleted

## 2019-06-05 ENCOUNTER — Ambulatory Visit (INDEPENDENT_AMBULATORY_CARE_PROVIDER_SITE_OTHER): Payer: Medicare PPO | Admitting: *Deleted

## 2019-06-05 DIAGNOSIS — I4819 Other persistent atrial fibrillation: Secondary | ICD-10-CM | POA: Diagnosis not present

## 2019-06-05 DIAGNOSIS — Z950 Presence of cardiac pacemaker: Secondary | ICD-10-CM | POA: Diagnosis not present

## 2019-06-05 DIAGNOSIS — I442 Atrioventricular block, complete: Secondary | ICD-10-CM

## 2019-06-05 DIAGNOSIS — I97190 Other postprocedural cardiac functional disturbances following cardiac surgery: Secondary | ICD-10-CM | POA: Diagnosis not present

## 2019-06-05 LAB — CUP PACEART INCLINIC DEVICE CHECK
Battery Remaining Longevity: 56 mo
Battery Voltage: 3.02 V
Brady Statistic RA Percent Paced: 16 %
Brady Statistic RV Percent Paced: 99.97 %
Date Time Interrogation Session: 20210401173012
Implantable Lead Implant Date: 20210317
Implantable Lead Implant Date: 20210317
Implantable Lead Location: 753859
Implantable Lead Location: 753860
Implantable Pulse Generator Implant Date: 20210317
Lead Channel Impedance Value: 512.5 Ohm
Lead Channel Impedance Value: 587.5 Ohm
Lead Channel Pacing Threshold Amplitude: 1 V
Lead Channel Pacing Threshold Amplitude: 1.75 V
Lead Channel Pacing Threshold Amplitude: 2.25 V
Lead Channel Pacing Threshold Pulse Width: 0.5 ms
Lead Channel Pacing Threshold Pulse Width: 0.5 ms
Lead Channel Pacing Threshold Pulse Width: 0.5 ms
Lead Channel Sensing Intrinsic Amplitude: 12 mV
Lead Channel Sensing Intrinsic Amplitude: 2.4 mV
Lead Channel Setting Pacing Amplitude: 5 V
Lead Channel Setting Pacing Amplitude: 5 V
Lead Channel Setting Pacing Pulse Width: 0.5 ms
Lead Channel Setting Sensing Sensitivity: 4 mV
Pulse Gen Model: 2272
Pulse Gen Serial Number: 3803323

## 2019-06-05 NOTE — Progress Notes (Signed)
Wound check appointment. Steri-strips removed. Extensive ecchymosis noted at left chest with pocket hematoma, soft to palpation. Proximal incision edges unapproximated. Scant sanguineous drainage noted. Wound assessed by Dr. Lovena Le, gauze and occlusive dressing applied. SNF orders written per Dr. Lovena Le to wash site with gentle soap and water and change dressing daily, continue to hold Xarelto until instructed to resume by Dr. Curt Bears. Plan for f/u in 2 weeks.  Pacemaker interrogated. RA threshold now 2.25V @ 0.72ms bipolar, 1.75V @ 0.94ms unipolar. RA sensing and impedances stable. Per Dr. Lovena Le, maintained bipolar polarity, increased RA output to 5.0V. RV thresholds, sensing, and impedances consistent with implant measurements. Device programmed at 5.0V for extra safety margin until next visit. Histogram distribution appropriate for patient and level of activity. 91% AT/AF burden. No high ventricular rates noted. Patient and daughter educated about wound care, arm mobility, lifting restrictions, and Merlin monitor. Patient's daughter to move monitor to SNF (patient currently admitted for rehab, disposition pending). ROV with Dr. Curt Bears on 06/16/19.

## 2019-06-05 NOTE — Telephone Encounter (Signed)
Spoke with Wesley Garcia, confirmed SNF transportation is bringing patient to wound check appointment this afternoon.

## 2019-06-06 ENCOUNTER — Telehealth: Payer: Self-pay | Admitting: *Deleted

## 2019-06-06 NOTE — Telephone Encounter (Signed)
Discussed wound concerns with Dr. Curt Bears (see 06/05/19 encounter for details). Per Dr. Curt Bears, will move patient's visit up to 06/10/19 at 4:15pm. Spoke with [name] (DPR). She is agreeable to this appointment and aware I will contact [facility] staff to schedule. Spoke with scheduler at [facility]; transportation will be arranged for patient.

## 2019-06-07 ENCOUNTER — Encounter: Payer: Self-pay | Admitting: Internal Medicine

## 2019-06-07 DIAGNOSIS — N179 Acute kidney failure, unspecified: Secondary | ICD-10-CM | POA: Insufficient documentation

## 2019-06-07 DIAGNOSIS — R9431 Abnormal electrocardiogram [ECG] [EKG]: Secondary | ICD-10-CM | POA: Insufficient documentation

## 2019-06-10 ENCOUNTER — Encounter: Payer: Self-pay | Admitting: Cardiology

## 2019-06-10 ENCOUNTER — Ambulatory Visit: Payer: Medicare PPO | Admitting: Nurse Practitioner

## 2019-06-10 ENCOUNTER — Ambulatory Visit (INDEPENDENT_AMBULATORY_CARE_PROVIDER_SITE_OTHER): Payer: Medicare PPO | Admitting: Cardiology

## 2019-06-10 VITALS — BP 102/60 | HR 89 | Ht 66.0 in | Wt 130.0 lb

## 2019-06-10 DIAGNOSIS — I4819 Other persistent atrial fibrillation: Secondary | ICD-10-CM

## 2019-06-10 DIAGNOSIS — I472 Ventricular tachycardia: Secondary | ICD-10-CM

## 2019-06-10 DIAGNOSIS — I4729 Other ventricular tachycardia: Secondary | ICD-10-CM

## 2019-06-10 MED ORDER — RIVAROXABAN 15 MG PO TABS: 15.0000 mg | ORAL_TABLET | Freq: Every day | ORAL | 6 refills | Status: DC

## 2019-06-10 NOTE — Progress Notes (Signed)
Electrophysiology Office Note   Date:  06/11/2019   ID:  Wesley Garcia, DOB [birth year], MRN [id number]  PCP:  Gayland Curry, DO  Cardiologist:  Irish Lack Primary Electrophysiologist:  Brien Lowe Meredith Leeds, MD    Chief Complaint: AF   History of Present Illness: Wesley Garcia is a 86 y.o. male who is being seen today for the evaluation of AF at the request of Gayland Curry, DO. Presenting today for electrophysiology evaluation.  He has a history of persistent atrial fibrillation, hypertension, diabetes, stage IV CKD who presented to the hospital with rapid atrial fibrillation.  He was continued on amiodarone.  He converted to sinus rhythm.  Due to the possibility of tachybradycardia syndrome, Saint Jude dual-chamber pacemaker was implanted on 05/21/2019.  He went into atrial fibrillation, and with difficult to control rapid rates, he had an AV node ablation on 05/28/2019.  Unfortunately, he has developed hematoma over his pacemaker site and he is presenting today for evaluation.  Today, he denies symptoms of palpitations, chest pain, shortness of breath, orthopnea, PND, lower extremity edema, claudication, dizziness, presyncope, syncope, bleeding, or neurologic sequela. The patient is tolerating medications without difficulties.    Past Medical History:  Diagnosis Date  . Chronic kidney disease (CKD), stage III (moderate)   . Diabetes mellitus   . Diverticulosis   . Hard of hearing   . Hemorrhoids   . Hx of adenomatous colonic polyps 2006  . Hypertension   . Persistent atrial fibrillation ([place]) 03/2019  . Pleural effusion 05/20/2019  . Presence of permanent cardiac pacemaker 05/21/2019  . 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 AV NODE ABLATION N/A 05/28/2019   Procedure: AV NODE ABLATION;  Surgeon: Thompson Grayer, MD;  Location: [place] [facility];  Service: Cardiovascular;  Laterality: N/A;  . CARDIOVERSION N/A  04/07/2019   Procedure: CARDIOVERSION;  Surgeon: Josue Hector, MD;  Location: [facility] ENDOSCOPY;  Service: Cardiovascular;  Laterality: N/A;  . CARDIOVERSION N/A 05/13/2019   Procedure: CARDIOVERSION;  Surgeon: Skeet Latch, MD;  Location: [place];  Service: Cardiovascular;  Laterality: N/A;  . CHOLECYSTECTOMY N/A 04/17/2014   Procedure: LAPAROSCOPIC CHOLECYSTECTOMY WITH INTRAOPERATIVE CHOLANGIOGRAM;  Surgeon: Erroll Luna, MD;  Location: [place];  Service: General;  Laterality: N/A;  . COLONOSCOPY  11/02/2007  . INSERT / REPLACE / REMOVE PACEMAKER    . MAXILLECTOMY Right 2003   SCCA alveolar ridge  . PACEMAKER IMPLANT N/A 05/21/2019   Procedure: PACEMAKER IMPLANT;  Surgeon: Constance Haw, MD;  Location: [place] [facility];  Service: Cardiovascular;  Laterality: N/A;     Current Outpatient Medications  Medication Sig Dispense Refill  . Rivaroxaban (XARELTO) 15 MG TABS tablet Take 1 tablet (15 mg total) by mouth daily with supper. 60 tablet 6   No current facility-administered medications for this visit.    Allergies:   Tape   Social History:  The patient  reports that he quit smoking about 19 years ago. His smoking use included cigars. He has never used smokeless tobacco. He reports previous alcohol use. He reports that he does not use drugs.   Family History:  The patient's family history includes Bone cancer in his sister; Cancer in his mother; Diabetes in his brother; Kidney disease in his sister.    ROS:  Please see the history of present illness.   Otherwise, review of systems is positive for none.   All other systems are  reviewed and negative.    PHYSICAL EXAM: VS:  BP 102/60   Pulse 89   Ht 5\' 6"  (1.676 m)   Wt 130 lb (59 kg)   SpO2 90%   BMI 20.98 kg/m  , BMI Body mass index is 20.98 kg/m. GEN: Well nourished, well developed, in no acute distress  HEENT: normal  Neck: no JVD, carotid bruits, or masses Cardiac: RRR; no murmurs, rubs, or gallops,no edema    Respiratory:  clear to auscultation bilaterally, normal work of breathing GI: soft, nontender, nondistended, + BS MS: no deformity or atrophy  Skin: warm and dry, device site has 2 eschars and swelling.  Eschars are located on the medial portion and have healed somewhat since last visit Neuro:  Strength and sensation are intact Psych: euthymic mood, full affect  EKG:  EKG is not ordered today. Personal review of the ekg ordered 05/29/2019 shows ventricular paced   Device interrogation is reviewed today in detail.  See PaceArt for details.   Recent Labs: 05/20/2019: ALT 14; B Natriuretic Peptide 1,671.4; TSH 6.636 05/26/2019: Magnesium 2.1 05/29/2019: Hemoglobin 10.2; Platelets 223 06/02/2019: BUN 39; Creatinine, Ser 2.10; Potassium 4.3; Sodium 137    Lipid Panel     Component Value Date/Time   CHOL 86 10/01/2018 0848   CHOL 95 (L) 05/07/2014 0813   TRIG 49 10/01/2018 0848   HDL 47 10/01/2018 0848   HDL 65 05/07/2014 0813   CHOLHDL 1.8 10/01/2018 0848   LDLCALC 25 10/01/2018 0848     Wt Readings from Last 3 Encounters:  06/10/19 130 lb (59 kg)  06/04/19 136 lb (61.7 kg)  05/30/19 136 lb 4.8 oz (61.8 kg)      Other studies Reviewed: Additional studies/ records that were reviewed today include: TTE 04/21/2019 Review of the above records today demonstrates:  1. Septal apical distal anterior wall mid and apical inferior wall  hypokinesis Basal function preserved Abnormal GLS -6. Left ventricular  ejection fraction, by estimation, is 25%. The left ventricle has severely  decreased function. The left ventricle  demonstrates regional wall motion abnormalities (see scoring  diagram/findings for description). The left ventricular internal cavity  size was moderately to severely dilated. Left ventricular diastolic  parameters are indeterminate.  2. Right ventricular systolic function is normal. The right ventricular  size is normal. There is moderately elevated pulmonary artery  systolic  pressure.  3. Left atrial size was mildly dilated.  4. The mitral valve is normal in structure and function. Mild mitral  valve regurgitation. No evidence of mitral stenosis.  5. The aortic valve is tricuspid. Aortic valve regurgitation is trivial.  Mild aortic valve sclerosis is present, with no evidence of aortic valve  stenosis.  6. The inferior vena cava is dilated in size with >50% respiratory  variability, suggesting right atrial pressure of 8 mmHg.    ASSESSMENT AND PLAN:  1.  Tachybradycardia syndrome: Status post Saint Jude dual-chamber pacemaker.  Unfortunately he has eschars over his device site and it is healing by secondary intention.  At this point, I do feel that it F[place] continue to heal appropriately.  We Catelynn Sparger bring him back in 2 weeks for a wound check and to decrease his paced rate.  2.  Persistent atrial fibrillation: Status post AV node ablation.  Anticoagulation currently held due to hematoma.  CHA2DS2-VASc of 4.  He is usually on Xarelto.  3.  Chronic systolic heart failure: Thought due to a tachycardia mediated cardiomyopathy possibly.  No obvious volume overload.  No changes.    Current medicines are reviewed at length with the patient today.   The patient does not have concerns regarding his medicines.  The following changes were made today: Restart Xarelto  Labs/ tests ordered today include:  No orders of the defined types were placed in this encounter.    Disposition:   FU with Maxtyn Nuzum 3 months  Signed, Jazmyne Beauchesne Meredith Leeds, MD  06/11/2019 8:00 AM     [address]  [place] [address] [phone] (office) [phone] (fax)

## 2019-06-10 NOTE — Patient Instructions (Signed)
Medication Instructions:  Your physician has recommended you make the following change in your medication:  1. START Xarelto 15 mg once daily  *If you need a refill on your cardiac medications before your next appointment, please call your pharmacy*   Lab Work: None ordered If you have labs (blood work) drawn today and your tests are completely normal, you will receive your results only by: Marland Kitchen MyChart Message (if you have MyChart) OR . A paper copy in the mail If you have any lab test that is abnormal or we need to change your treatment, we will call you to review the results.   Testing/Procedures: None ordered   Follow-Up: At [facility], you and your health needs are our priority.  As part of our continuing mission to provide you with exceptional heart care, we have created designated Provider Care Teams.  These Care Teams include your primary Cardiologist (physician) and Advanced Practice Providers (APPs -  Physician Assistants and Nurse Practitioners) who all work together to provide you with the care you need, when you need it.  We recommend signing up for the patient portal called "MyChart".  Sign up information is provided on this After Visit Summary.  MyChart is used to connect with patients for Virtual Visits (Telemedicine).  Patients are able to view lab/test results, encounter notes, upcoming appointments, etc.  Non-urgent messages can be sent to your provider as well.   To learn more about what you can do with MyChart, go to [email or web address].    Your next appointment:   2 week(s)  The format for your next appointment:   In Person  Provider:   You may see None or one of the following Advanced Practice Providers on your designated Care Team:    Chanetta Marshall, NP  Tommye Standard, PA-C  Legrand Como "[place]" [place], [place]    Thank you for choosing [facility]!!   Trinidad Curet, RN [phone]    Other Instructions  Continue washing site daily with soap  and water. Continue with daily dressing changes (please use the current dressing type for the changes).

## 2019-06-12 ENCOUNTER — Non-Acute Institutional Stay (SKILLED_NURSING_FACILITY): Payer: Medicare PPO | Admitting: Adult Health

## 2019-06-12 ENCOUNTER — Encounter: Payer: Self-pay | Admitting: Adult Health

## 2019-06-12 DIAGNOSIS — Z794 Long term (current) use of insulin: Secondary | ICD-10-CM

## 2019-06-12 DIAGNOSIS — N184 Chronic kidney disease, stage 4 (severe): Secondary | ICD-10-CM | POA: Diagnosis not present

## 2019-06-12 DIAGNOSIS — E1122 Type 2 diabetes mellitus with diabetic chronic kidney disease: Secondary | ICD-10-CM

## 2019-06-12 DIAGNOSIS — I5023 Acute on chronic systolic (congestive) heart failure: Secondary | ICD-10-CM | POA: Diagnosis not present

## 2019-06-12 NOTE — Progress Notes (Signed)
Location:    [place] Room Number: 155/P Place of Service:  SNF (31)   CODE STATUS: DNR  Allergies  Allergen Reactions  . Tape Other (See Comments)    SKIN IS THIN AND IT TEARS AND BRUISES VERY, VERY EASILY!! PLEASE USE AN ALTERNATIVE.    Chief Complaint  Patient presents with  . Acute Visit    care plan meeting      HPI:  We have come together for her care plan meeting. No BIMS: mood 0/30. Family is present. No reports of falls. He has lost weight from 138 to his current weight of 112.4 pounds. His usual weight is around 150 pounds. He will decline therapy. He will decline meals. He is currently being treated for oral thrush. The goal of his care is to return home. He has stated that "he is ready to meet Jesus". He is approaching his 23 st day. We have discussed that he requires max assist with all of his adls. He have discussed him going home with hospice care. At this time his family is wanting to attempt therapy on a home health basis; but will approach his PCP with hospice if he does not improve at home.   Past Medical History:  Diagnosis Date  . Chronic kidney disease (CKD), stage III (moderate)   . Diabetes mellitus   . Diverticulosis   . Hard of hearing   . Hemorrhoids   . Hx of adenomatous colonic polyps 2006  . Hypertension   . Persistent atrial fibrillation ([place]) 03/2019  . Pleural effusion 05/20/2019  . Presence of permanent cardiac pacemaker 05/21/2019  . 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 AV NODE ABLATION N/A 05/28/2019   Procedure: AV NODE ABLATION;  Surgeon: Thompson Grayer, MD;  Location: [place] [facility];  Service: Cardiovascular;  Laterality: N/A;  . CARDIOVERSION N/A 04/07/2019   Procedure: CARDIOVERSION;  Surgeon: Josue Hector, MD;  Location: [facility] ENDOSCOPY;  Service: Cardiovascular;  Laterality: N/A;  . CARDIOVERSION N/A 05/13/2019   Procedure: CARDIOVERSION;   Surgeon: Skeet Latch, MD;  Location: [place];  Service: Cardiovascular;  Laterality: N/A;  . CHOLECYSTECTOMY N/A 04/17/2014   Procedure: LAPAROSCOPIC CHOLECYSTECTOMY WITH INTRAOPERATIVE CHOLANGIOGRAM;  Surgeon: Erroll Luna, MD;  Location: [place];  Service: General;  Laterality: N/A;  . COLONOSCOPY  11/02/2007  . INSERT / REPLACE / REMOVE PACEMAKER    . MAXILLECTOMY Right 2003   SCCA alveolar ridge  . PACEMAKER IMPLANT N/A 05/21/2019   Procedure: PACEMAKER IMPLANT;  Surgeon: Constance Haw, MD;  Location: [place] [facility];  Service: Cardiovascular;  Laterality: N/A;    Social History   Socioeconomic History  . Marital status: Married    Spouse name: Not on file  . Number of children: 5  . Years of education: Not on file  . Highest education level: Not on file  Occupational History  . Occupation: Retired  Tobacco Use  . Smoking status: Former Smoker    Types: Cigars    Quit date: 04/07/2000    Years since quitting: 19.1  . Smokeless tobacco: Never Used  Substance and Sexual Activity  . Alcohol use: Not Currently    Comment: socially  . Drug use: No  . Sexual activity: Not Currently  Other Topics Concern  . Not on file  Social History Narrative   Retired, married   Social Determinants of Radio broadcast assistant  Strain:   . Difficulty of Paying Living Expenses:   Food Insecurity:   . Worried About Charity fundraiser in the Last Year:   . Arboriculturist in the Last Year:   Transportation Needs:   . Film/video editor (Medical):   Wesley Garcia Lack of Transportation (Non-Medical):   Physical Activity:   . Days of Exercise per Week:   . Minutes of Exercise per Session:   Stress:   . Feeling of Stress :   Social Connections:   . Frequency of Communication with Friends and Family:   . Frequency of Social Gatherings with Friends and Family:   . Attends Religious Services:   . Active Member of Clubs or Organizations:   . Attends Archivist  Meetings:   Wesley Garcia Marital Status:   Intimate Partner Violence:   . Fear of Current or Ex-Partner:   . Emotionally Abused:   Wesley Garcia Physically Abused:   . Sexually Abused:    Family History  Problem Relation Age of Onset  . Cancer Mother        ?  Wesley Garcia Bone cancer Sister   . Diabetes Brother   . Kidney disease Sister   . Colon cancer Neg Hx       VITAL SIGNS BP 136/70   Pulse 72   Temp 98.1 F (36.7 C) (Oral)   Resp 20   Ht 5\' 6"  (1.676 m)   Wt 122 lb 6.4 oz (55.5 kg)   SpO2 95%   BMI 19.76 kg/m   Outpatient Encounter Medications as of 06/12/2019  Medication Sig  . acetaminophen (TYLENOL) 500 MG tablet Take 1,000 mg by mouth every 8 (eight) hours as needed for mild pain or headache.   Roseanne Kaufman [place]-Castor Oil (VENELEX) OINT Apply 1 application topically in the morning, at noon, and at bedtime. Apply to shear left buttock each shift  . feeding supplement, ENSURE ENLIVE, (ENSURE ENLIVE) LIQD Take 237 mLs by mouth in the morning, at noon, and at bedtime. Due to very poor oral meal intake.  He is high risk for dehydration and malnutrition  . fluconazole (DIFLUCAN) 100 MG tablet Take 100 mg by mouth daily. Oral Thrush.  . furosemide (LASIX) 40 MG tablet Take 1 tablet (40 mg total) by mouth daily.  . insulin glargine, 2 Unit Dial, (TOUJEO MAX SOLOSTAR) 300 UNIT/ML Solostar Pen Inject 15 Units into the skin daily before breakfast.  . linagliptin (TRADJENTA) 5 MG TABS tablet Take 1 tablet (5 mg total) by mouth daily.  . magnesium hydroxide (MILK OF MAGNESIA) 400 MG/5ML suspension Take 30 mLs by mouth daily as needed for mild constipation.  . Multiple Vitamins-Minerals (OCUVITE ADULT 50+) CAPS Take 1 capsule by mouth daily with breakfast.   . NON FORMULARY Diet Change: Dys 2(ground), continue thin liquids (NAS)  . pantoprazole (PROTONIX) 40 MG tablet TAKE 1 TABLET (40 MG TOTAL) BY MOUTH 2 (TWO) TIMES DAILY BEFORE A MEAL.  Wesley Garcia potassium chloride SA (KLOR-CON) 20 MEQ tablet Take 1 tablet (20 mEq  total) by mouth daily.  . Rivaroxaban (XARELTO) 15 MG TABS tablet Take 1 tablet (15 mg total) by mouth daily with supper.  . tamsulosin (FLOMAX) 0.4 MG CAPS capsule Take 1 capsule (0.4 mg total) by mouth at bedtime.   No facility-administered encounter medications on file as of 06/12/2019.     SIGNIFICANT DIAGNOSTIC EXAMS   PREVIOUS   05-20-19: chest x-ray: Moderate-sized bilateral pleural effusions with adjacent airspace opacities favored to represent compressive  atelectasis.  05-24-19: chest x-ray: Stable examination as compared to 05/22/2019. Unchanged bilateral pleural effusions and associated bibasilar atelectasis  NO NEW EXAMS.   LABS REVIEWED PREVIOUS  04-10-19: hgb a1c 6.9 05-20-19: wbc 8.1; hgb 14.4; hct 46.0; mcv 92. 6plt 253; glucose 118; bun 25; creat 1.86; k+ 3.4; na++ 139; ca 8.6; liver normal albumin 2.8; blood and urine culture: no growth; BNP 1671.4 05-22-19: glucose 138; bun 21; creat 1.98; k+ 4.1; na++ 139; ca 8.3 05-24-19: glucose 78; bun 24; creat 2.16; k+ 3.7; na++ 137; ca 8.5 mag 1.9 05-26-19: glucose 122; bun 33; creat 2.37; k+ 5.3; na++ 134; ca 8.5; mag 2.1 05-28-19: wbc 9.8; hgb 10.9; hct 35.3; mcv 91.9 plt 207; glucose 189; bun 48; creat 2.99; k+ 4.6; na++ 135; ca 8.5 05-29-19: wbc 8.9; hgb 10.2; hct 33.1; mcv 90.9 plt 223; glucose 179; bun 50; creat 2.88; k+ 4.6; na++ 135; ca 8.5  TODAY  06-02-19: glucose 171; bun 39;creat 2.10; k+ 4.3; na++ 137; ca 8.5  Review of Systems  Constitutional: Positive for malaise/fatigue.  Respiratory: Negative for cough and shortness of breath.   Cardiovascular: Negative for chest pain, palpitations and leg swelling.  Gastrointestinal: Negative for abdominal pain, constipation and heartburn.  Musculoskeletal: Negative for back pain, joint pain and myalgias.  Skin: Negative.   Neurological: Negative for dizziness.  Psychiatric/Behavioral: Positive for depression. The patient is not nervous/anxious.     Physical  Exam Constitutional:      General: He is not in acute distress.    Appearance: He is well-developed. He is not diaphoretic.  Neck:     Thyroid: No thyromegaly.  Cardiovascular:     Rate and Rhythm: Normal rate. Rhythm irregular.     Pulses: Normal pulses.     Heart sounds: Normal heart sounds.  Pulmonary:     Effort: Pulmonary effort is normal. No respiratory distress.     Breath sounds: Normal breath sounds.  Abdominal:     General: Bowel sounds are normal. There is no distension.     Palpations: Abdomen is soft.     Tenderness: There is no abdominal tenderness.  Musculoskeletal:        General: Normal range of motion.     Cervical back: Neck supple.     Right lower leg: No edema.     Left lower leg: No edema.  Lymphadenopathy:     Cervical: No cervical adenopathy.  Skin:    General: Skin is warm and dry.  Neurological:     Mental Status: He is alert and oriented to person, place, and time.     Comments: Bilateral tremor        ASSESSMENT/ PLAN:  TODAY  1. Acute on chronic systolic CHF 2. CKD stage 4 due to type 2 diabetes mellitus 3. Type 2 diabetes mellitus with CKD stage 4 with current long term use of insulin  Will continue to attempt therapy Will continue current medications Will continue to monitor his status.  His goal remains to return home.  His family is planning on taking him home over the next several days.     MD is aware of resident's narcotic use and is in agreement with current plan of care. We will attempt to wean resident as appropriate.  Ok Edwards NP [facility]  Contact [phone] Monday through Friday 8am- 5pm  After hours call [phone]

## 2019-06-13 ENCOUNTER — Other Ambulatory Visit: Payer: Self-pay | Admitting: Adult Health

## 2019-06-13 ENCOUNTER — Non-Acute Institutional Stay (SKILLED_NURSING_FACILITY): Payer: Medicare PPO | Admitting: Adult Health

## 2019-06-13 ENCOUNTER — Encounter: Payer: Self-pay | Admitting: Adult Health

## 2019-06-13 DIAGNOSIS — N410 Acute prostatitis: Secondary | ICD-10-CM

## 2019-06-13 DIAGNOSIS — E1122 Type 2 diabetes mellitus with diabetic chronic kidney disease: Secondary | ICD-10-CM

## 2019-06-13 DIAGNOSIS — I5023 Acute on chronic systolic (congestive) heart failure: Secondary | ICD-10-CM | POA: Diagnosis not present

## 2019-06-13 DIAGNOSIS — E1129 Type 2 diabetes mellitus with other diabetic kidney complication: Secondary | ICD-10-CM

## 2019-06-13 DIAGNOSIS — I4819 Other persistent atrial fibrillation: Secondary | ICD-10-CM | POA: Diagnosis not present

## 2019-06-13 DIAGNOSIS — N184 Chronic kidney disease, stage 4 (severe): Secondary | ICD-10-CM | POA: Diagnosis not present

## 2019-06-13 MED ORDER — FUROSEMIDE 40 MG PO TABS: 40.0000 mg | ORAL_TABLET | Freq: Every day | ORAL | 0 refills | Status: DC

## 2019-06-13 MED ORDER — POTASSIUM CHLORIDE CRYS ER 20 MEQ PO TBCR: 20.0000 meq | EXTENDED_RELEASE_TABLET | Freq: Every day | ORAL | 0 refills | Status: DC

## 2019-06-13 MED ORDER — RIVAROXABAN 15 MG PO TABS: 15.0000 mg | ORAL_TABLET | Freq: Every day | ORAL | 0 refills | Status: DC

## 2019-06-13 MED ORDER — TOUJEO MAX SOLOSTAR 300 UNIT/ML ~~LOC~~ SOPN: 15.0000 [IU] | PEN_INJECTOR | Freq: Every day | SUBCUTANEOUS | 0 refills | Status: DC

## 2019-06-13 MED ORDER — FLUCONAZOLE 100 MG PO TABS: 100.0000 mg | ORAL_TABLET | Freq: Every day | ORAL | 0 refills | Status: DC

## 2019-06-13 MED ORDER — PANTOPRAZOLE SODIUM 40 MG PO TBEC: 40.0000 mg | DELAYED_RELEASE_TABLET | Freq: Two times a day (BID) | ORAL | 0 refills | Status: DC

## 2019-06-13 MED ORDER — LINAGLIPTIN 5 MG PO TABS: 5.0000 mg | ORAL_TABLET | Freq: Every day | ORAL | 0 refills | Status: DC

## 2019-06-13 MED ORDER — TAMSULOSIN HCL 0.4 MG PO CAPS: 0.4000 mg | ORAL_CAPSULE | Freq: Every day | ORAL | 0 refills | Status: DC

## 2019-06-13 NOTE — Progress Notes (Deleted)
Location:    [place] Room Number: 155/P Place of Service:  SNF (31)   CODE STATUS: DNR  Allergies  Allergen Reactions  . Tape Other (See Comments)    SKIN IS THIN AND IT TEARS AND BRUISES VERY, VERY EASILY!! PLEASE USE AN ALTERNATIVE.    Chief Complaint  Patient presents with  . Acute Visit    Short Term Rehab    HPI:    Past Medical History:  Diagnosis Date  . Chronic kidney disease (CKD), stage III (moderate)   . Diabetes mellitus   . Diverticulosis   . Hard of hearing   . Hemorrhoids   . Hx of adenomatous colonic polyps 2006  . Hypertension   . Persistent atrial fibrillation ([place]) 03/2019  . Pleural effusion 05/20/2019  . Presence of permanent cardiac pacemaker 05/21/2019  . 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 AV NODE ABLATION N/A 05/28/2019   Procedure: AV NODE ABLATION;  Surgeon: Thompson Grayer, MD;  Location: [place] [facility];  Service: Cardiovascular;  Laterality: N/A;  . CARDIOVERSION N/A 04/07/2019   Procedure: CARDIOVERSION;  Surgeon: Josue Hector, MD;  Location: [facility] ENDOSCOPY;  Service: Cardiovascular;  Laterality: N/A;  . CARDIOVERSION N/A 05/13/2019   Procedure: CARDIOVERSION;  Surgeon: Skeet Latch, MD;  Location: [place];  Service: Cardiovascular;  Laterality: N/A;  . CHOLECYSTECTOMY N/A 04/17/2014   Procedure: LAPAROSCOPIC CHOLECYSTECTOMY WITH INTRAOPERATIVE CHOLANGIOGRAM;  Surgeon: Erroll Luna, MD;  Location: [place];  Service: General;  Laterality: N/A;  . COLONOSCOPY  11/02/2007  . INSERT / REPLACE / REMOVE PACEMAKER    . MAXILLECTOMY Right 2003   SCCA alveolar ridge  . PACEMAKER IMPLANT N/A 05/21/2019   Procedure: PACEMAKER IMPLANT;  Surgeon: Constance Haw, MD;  Location: [place] [facility];  Service: Cardiovascular;  Laterality: N/A;    Social History   Socioeconomic History  . Marital status: Married    Spouse name: Not on file    . Number of children: 5  . Years of education: Not on file  . Highest education level: Not on file  Occupational History  . Occupation: Retired  Tobacco Use  . Smoking status: Former Smoker    Types: Cigars    Quit date: 04/07/2000    Years since quitting: 19.1  . Smokeless tobacco: Never Used  Substance and Sexual Activity  . Alcohol use: Not Currently    Comment: socially  . Drug use: No  . Sexual activity: Not Currently  Other Topics Concern  . Not on file  Social History Narrative   Retired, married   Social Determinants of Health   Financial Resource Strain:   . Difficulty of Paying Living Expenses:   Food Insecurity:   . Worried About Charity fundraiser in the Last Year:   . Arboriculturist in the Last Year:   Transportation Needs:   . Film/video editor (Medical):   Marland Kitchen Lack of Transportation (Non-Medical):   Physical Activity:   . Days of Exercise per Week:   . Minutes of Exercise per Session:   Stress:   . Feeling of Stress :   Social Connections:   . Frequency of Communication with Friends and Family:   . Frequency of Social Gatherings with Friends and Family:   . Attends Religious Services:   . Active Member of Clubs or Organizations:   . Attends Archivist Meetings:   .  Marital Status:   Intimate Partner Violence:   . Fear of Current or Ex-Partner:   . Emotionally Abused:   Marland Kitchen Physically Abused:   . Sexually Abused:    Family History  Problem Relation Age of Onset  . Cancer Mother        ?  Marland Kitchen Bone cancer Sister   . Diabetes Brother   . Kidney disease Sister   . Colon cancer Neg Hx       VITAL SIGNS BP 102/68   Pulse 70   Temp (!) 96.8 F (36 C) (Oral)   Resp 20   Ht 5\' 6"  (1.676 m)   Wt 122 lb 6.4 oz (55.5 kg)   SpO2 95%   BMI 19.76 kg/m   Outpatient Encounter Medications as of 06/13/2019  Medication Sig  . acetaminophen (TYLENOL) 500 MG tablet Take 1,000 mg by mouth every 8 (eight) hours as needed for mild pain or  headache.   Roseanne Kaufman [place]-Castor Oil (VENELEX) OINT Apply 1 application topically in the morning, at noon, and at bedtime. Apply to shear left buttock each shift  . feeding supplement, ENSURE ENLIVE, (ENSURE ENLIVE) LIQD Take 237 mLs by mouth in the morning, at noon, and at bedtime. Due to very poor oral meal intake.  He is high risk for dehydration and malnutrition  . fluconazole (DIFLUCAN) 100 MG tablet Take 100 mg by mouth daily. Oral Thrush.  . furosemide (LASIX) 40 MG tablet Take 1 tablet (40 mg total) by mouth daily.  . insulin glargine, 2 Unit Dial, (TOUJEO MAX SOLOSTAR) 300 UNIT/ML Solostar Pen Inject 15 Units into the skin daily before breakfast.  . linagliptin (TRADJENTA) 5 MG TABS tablet Take 1 tablet (5 mg total) by mouth daily.  . magnesium hydroxide (MILK OF MAGNESIA) 400 MG/5ML suspension Take 30 mLs by mouth daily as needed for mild constipation.  . Multiple Vitamins-Minerals (OCUVITE ADULT 50+) CAPS Take 1 capsule by mouth daily with breakfast.   . NON FORMULARY Diet Change: Dys 2(ground), continue thin liquids (NAS)  . pantoprazole (PROTONIX) 40 MG tablet TAKE 1 TABLET (40 MG TOTAL) BY MOUTH 2 (TWO) TIMES DAILY BEFORE A MEAL.  Marland Kitchen potassium chloride SA (KLOR-CON) 20 MEQ tablet Take 1 tablet (20 mEq total) by mouth daily.  . Rivaroxaban (XARELTO) 15 MG TABS tablet Take 1 tablet (15 mg total) by mouth daily with supper.  . tamsulosin (FLOMAX) 0.4 MG CAPS capsule Take 1 capsule (0.4 mg total) by mouth at bedtime.   No facility-administered encounter medications on file as of 06/13/2019.     SIGNIFICANT DIAGNOSTIC EXAMS       ASSESSMENT/ PLAN:    MD is aware of resident's narcotic use and is in agreement with current plan of care. We will attempt to wean resident as appropriate.  Ok Edwards NP [facility]  Contact [phone] Monday through Friday 8am- 5pm  After hours call [phone]

## 2019-06-13 NOTE — Progress Notes (Signed)
Location:    [place] Room Number: 155/P Place of Service:  SNF (31)    CODE STATUS: DNR  Allergies  Allergen Reactions  . Tape Other (See Comments)    SKIN IS THIN AND IT TEARS AND BRUISES VERY, VERY EASILY!! PLEASE USE AN ALTERNATIVE.    Chief Complaint  Patient presents with  . Discharge Note    Discharge Visit    HPI:  He is being discharged to home with home health for pt/ot/cna. He will not need any dme. He will need his prescriptions written and will need to follow up with his pcp. He had been hospitalized for acute on chronic chf; pleural effusion afib status post ablation and pacemaker insertion. He was admitted to this facility for short term rehab to improve upon his level of independence with his adls. He did not participate in therapy on a consistent basis; had a poor appetite. He stated that he wanted to go home. His family has decided to take him to see if this will improve his status.    Past Medical History:  Diagnosis Date  . Chronic kidney disease (CKD), stage III (moderate)   . Diabetes mellitus   . Diverticulosis   . Hard of hearing   . Hemorrhoids   . Hx of adenomatous colonic polyps 2006  . Hypertension   . Persistent atrial fibrillation ([place]) 03/2019  . Pleural effusion 05/20/2019  . Presence of permanent cardiac pacemaker 05/21/2019  . 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 AV NODE ABLATION N/A 05/28/2019   Procedure: AV NODE ABLATION;  Surgeon: Thompson Grayer, MD;  Location: [place] [facility];  Service: Cardiovascular;  Laterality: N/A;  . CARDIOVERSION N/A 04/07/2019   Procedure: CARDIOVERSION;  Surgeon: Josue Hector, MD;  Location: [facility] ENDOSCOPY;  Service: Cardiovascular;  Laterality: N/A;  . CARDIOVERSION N/A 05/13/2019   Procedure: CARDIOVERSION;  Surgeon: Skeet Latch, MD;  Location: [place];  Service: Cardiovascular;  Laterality: N/A;  .  CHOLECYSTECTOMY N/A 04/17/2014   Procedure: LAPAROSCOPIC CHOLECYSTECTOMY WITH INTRAOPERATIVE CHOLANGIOGRAM;  Surgeon: Erroll Luna, MD;  Location: [place];  Service: General;  Laterality: N/A;  . COLONOSCOPY  11/02/2007  . INSERT / REPLACE / REMOVE PACEMAKER    . MAXILLECTOMY Right 2003   SCCA alveolar ridge  . PACEMAKER IMPLANT N/A 05/21/2019   Procedure: PACEMAKER IMPLANT;  Surgeon: Constance Haw, MD;  Location: [place] [facility];  Service: Cardiovascular;  Laterality: N/A;    Social History   Socioeconomic History  . Marital status: Married    Spouse name: Not on file  . Number of children: 5  . Years of education: Not on file  . Highest education level: Not on file  Occupational History  . Occupation: Retired  Tobacco Use  . Smoking status: Former Smoker    Types: Cigars    Quit date: 04/07/2000    Years since quitting: 19.1  . Smokeless tobacco: Never Used  Substance and Sexual Activity  . Alcohol use: Not Currently    Comment: socially  . Drug use: No  . Sexual activity: Not Currently  Other Topics Concern  . Not on file  Social History Narrative   Retired, married   Social Determinants of Health   Financial Resource Strain:   . Difficulty of Paying Living Expenses:   Food Insecurity:   . Worried About Charity fundraiser in the Last Year:   .  Ran Out of Food in the Last Year:   Transportation Needs:   . Film/video editor (Medical):   Marland Kitchen Lack of Transportation (Non-Medical):   Physical Activity:   . Days of Exercise per Week:   . Minutes of Exercise per Session:   Stress:   . Feeling of Stress :   Social Connections:   . Frequency of Communication with Friends and Family:   . Frequency of Social Gatherings with Friends and Family:   . Attends Religious Services:   . Active Member of Clubs or Organizations:   . Attends Archivist Meetings:   Marland Kitchen Marital Status:   Intimate Partner Violence:   . Fear of Current or Ex-Partner:   .  Emotionally Abused:   Marland Kitchen Physically Abused:   . Sexually Abused:    Family History  Problem Relation Age of Onset  . Cancer Mother        ?  Marland Kitchen Bone cancer Sister   . Diabetes Brother   . Kidney disease Sister   . Colon cancer Neg Hx     VITAL SIGNS BP 102/68   Pulse 70   Temp (!) 96.8 F (36 C) (Oral)   Resp 20   Ht 5\' 6"  (1.676 m)   Wt 122 lb 6.4 oz (55.5 kg)   SpO2 95%   BMI 19.76 kg/m   Patient's Medications  New Prescriptions   No medications on file  Previous Medications   ACETAMINOPHEN (TYLENOL) 500 MG TABLET    Take 1,000 mg by mouth every 8 (eight) hours as needed for mild pain or headache.    BALSAM PERU-CASTOR OIL (VENELEX) OINT    Apply 1 application topically in the morning, at noon, and at bedtime. Apply to shear left buttock each shift   FEEDING SUPPLEMENT, ENSURE ENLIVE, (ENSURE ENLIVE) LIQD    Take 237 mLs by mouth in the morning, at noon, and at bedtime. Due to very poor oral meal intake.  He is high risk for dehydration and malnutrition   FLUCONAZOLE (DIFLUCAN) 100 MG TABLET    Take 100 mg by mouth daily. Oral Thrush.   FUROSEMIDE (LASIX) 40 MG TABLET    Take 1 tablet (40 mg total) by mouth daily.   INSULIN GLARGINE, 2 UNIT DIAL, (TOUJEO MAX SOLOSTAR) 300 UNIT/ML SOLOSTAR PEN    Inject 15 Units into the skin daily before breakfast.   LINAGLIPTIN (TRADJENTA) 5 MG TABS TABLET    Take 1 tablet (5 mg total) by mouth daily.   MAGNESIUM HYDROXIDE (MILK OF MAGNESIA) 400 MG/5ML SUSPENSION    Take 30 mLs by mouth daily as needed for mild constipation.   MULTIPLE VITAMINS-MINERALS (OCUVITE ADULT 50+) CAPS    Take 1 capsule by mouth daily with breakfast.    NON FORMULARY    Diet Change: Dys 2(ground), continue thin liquids (NAS)   PANTOPRAZOLE (PROTONIX) 40 MG TABLET    TAKE 1 TABLET (40 MG TOTAL) BY MOUTH 2 (TWO) TIMES DAILY BEFORE A MEAL.   POTASSIUM CHLORIDE SA (KLOR-CON) 20 MEQ TABLET    Take 1 tablet (20 mEq total) by mouth daily.   RIVAROXABAN (XARELTO) 15 MG  TABS TABLET    Take 1 tablet (15 mg total) by mouth daily with supper.   TAMSULOSIN (FLOMAX) 0.4 MG CAPS CAPSULE    Take 1 capsule (0.4 mg total) by mouth at bedtime.  Modified Medications   No medications on file  Discontinued Medications   No medications on file  SIGNIFICANT DIAGNOSTIC EXAMS   PREVIOUS   05-20-19: chest x-ray: Moderate-sized bilateral pleural effusions with adjacent airspace opacities favored to represent compressive atelectasis.  05-24-19: chest x-ray: Stable examination as compared to 05/22/2019. Unchanged bilateral pleural effusions and associated bibasilar atelectasis  NO NEW EXAMS.   LABS REVIEWED PREVIOUS  04-10-19: hgb a1c 6.9 05-20-19: wbc 8.1; hgb 14.4; hct 46.0; mcv 92. 6plt 253; glucose 118; bun 25; creat 1.86; k+ 3.4; na++ 139; ca 8.6; liver normal albumin 2.8; blood and urine culture: no growth; BNP 1671.4 05-22-19: glucose 138; bun 21; creat 1.98; k+ 4.1; na++ 139; ca 8.3 05-24-19: glucose 78; bun 24; creat 2.16; k+ 3.7; na++ 137; ca 8.5 mag 1.9 05-26-19: glucose 122; bun 33; creat 2.37; k+ 5.3; na++ 134; ca 8.5; mag 2.1 05-28-19: wbc 9.8; hgb 10.9; hct 35.3; mcv 91.9 plt 207; glucose 189; bun 48; creat 2.99; k+ 4.6; na++ 135; ca 8.5 05-29-19: wbc 8.9; hgb 10.2; hct 33.1; mcv 90.9 plt 223; glucose 179; bun 50; creat 2.88; k+ 4.6; na++ 135; ca 8.5 06-02-19: glucose 171; bun 39;creat 2.10; k+ 4.3; na++ 137; ca 8.5  NO NEW LABS.   Review of Systems  Constitutional: Negative for malaise/fatigue.  Respiratory: Negative for cough and shortness of breath.   Cardiovascular: Negative for chest pain, palpitations and leg swelling.  Gastrointestinal: Negative for abdominal pain, constipation and heartburn.  Musculoskeletal: Negative for back pain, joint pain and myalgias.  Skin: Negative.   Neurological: Negative for dizziness.  Psychiatric/Behavioral: The patient is not nervous/anxious.     Physical Exam Constitutional:      General: He is not in acute  distress.    Appearance: He is well-developed. He is not diaphoretic.  Neck:     Thyroid: No thyromegaly.  Cardiovascular:     Rate and Rhythm: Normal rate. Rhythm irregular.     Pulses: Normal pulses.     Heart sounds: Normal heart sounds.  Pulmonary:     Effort: Pulmonary effort is normal. No respiratory distress.     Breath sounds: Normal breath sounds.  Abdominal:     General: Bowel sounds are normal. There is no distension.     Palpations: Abdomen is soft.     Tenderness: There is no abdominal tenderness.  Musculoskeletal:        General: Normal range of motion.     Cervical back: Neck supple.     Right lower leg: No edema.     Left lower leg: No edema.  Lymphadenopathy:     Cervical: No cervical adenopathy.  Skin:    General: Skin is warm and dry.  Neurological:     Mental Status: He is alert and oriented to person, place, and time.  Psychiatric:        Mood and Affect: Mood normal.       ASSESSMENT/ PLAN:   Patient is being discharged with the following home health services:  Pt/ot/cna to evaluate and treat as indicated for gait balance strength adl training adl care.   Patient is being discharged with the following durable medical equipment:  None needed   Patient has been advised to f/u with their PCP in 1-2 weeks to bring them up to date on their rehab stay.  Social services at facility was responsible for arranging this appointment.  Pt was provided with a 30 day supply of prescriptions for medications and refills must be obtained from their PCP.  For controlled substances, a more limited supply may be provided adequate until PCP  appointment only.   A 30 day supply of his prescription medications have been sent to [facility]  Time spent with patient: 35 minutes: dme; home health medications.   Ok Edwards NP [facility]  Contact [phone] Monday through Friday 8am- 5pm  After hours call [phone]

## 2019-06-16 ENCOUNTER — Encounter: Payer: Medicare PPO | Admitting: Cardiology

## 2019-06-17 ENCOUNTER — Other Ambulatory Visit: Payer: Self-pay | Admitting: Adult Health

## 2019-06-19 ENCOUNTER — Encounter: Payer: Self-pay | Admitting: Internal Medicine

## 2019-06-19 ENCOUNTER — Ambulatory Visit (INDEPENDENT_AMBULATORY_CARE_PROVIDER_SITE_OTHER): Payer: Medicare PPO | Admitting: Internal Medicine

## 2019-06-19 ENCOUNTER — Ambulatory Visit: Payer: Self-pay | Admitting: Internal Medicine

## 2019-06-19 ENCOUNTER — Other Ambulatory Visit: Payer: Self-pay

## 2019-06-19 VITALS — BP 110/58 | HR 82 | Temp 97.5°F | Ht 66.0 in | Wt 115.6 lb

## 2019-06-19 DIAGNOSIS — Z7189 Other specified counseling: Secondary | ICD-10-CM

## 2019-06-19 DIAGNOSIS — L89100 Pressure ulcer of unspecified part of back, unstageable: Secondary | ICD-10-CM

## 2019-06-19 DIAGNOSIS — R531 Weakness: Secondary | ICD-10-CM

## 2019-06-19 DIAGNOSIS — I4819 Other persistent atrial fibrillation: Secondary | ICD-10-CM

## 2019-06-19 DIAGNOSIS — N184 Chronic kidney disease, stage 4 (severe): Secondary | ICD-10-CM

## 2019-06-19 DIAGNOSIS — Z950 Presence of cardiac pacemaker: Secondary | ICD-10-CM

## 2019-06-19 DIAGNOSIS — E1122 Type 2 diabetes mellitus with diabetic chronic kidney disease: Secondary | ICD-10-CM

## 2019-06-19 DIAGNOSIS — I495 Sick sinus syndrome: Secondary | ICD-10-CM

## 2019-06-19 DIAGNOSIS — L89616 Pressure-induced deep tissue damage of right heel: Secondary | ICD-10-CM

## 2019-06-19 NOTE — Progress Notes (Signed)
Location:  [facility] clinic Provider: Aleesia Henney L. Mariea Clonts, D.O., C.M.D.  Code Status: DNR Goals of Care:  Advanced Directives 06/19/2019  Does Patient Have a Medical Advance Directive? Yes  Type of Advance Directive Out of facility DNR (pink MOST or yellow form)  Does patient want to make changes to medical advance directive? No - Patient declined  Copy of [place] in Chart? -  Would patient like information on creating a medical advance directive? -  Pre-existing out of facility DNR order (yellow form or pink MOST form) Pink MOST/Yellow Form most recent copy in chart - Physician notified to receive inpatient order     Chief Complaint  Patient presents with  . Medical Management of Chronic Issues    Follow up from [facility]  and referral for PT/OT     HPI: Patient is a 86 y.o. male seen today for hospital follow-up s/p admission from 3/16-3/25/21 for persistent afib and subsequent stay at [facility] for rehab 3/25-06/05/19.  Ranvir had been doing poorly due to persistent afib with RVR that was causing severe weakness, generalized malaise, and overall failure to thrive.  He has baseline CKD IV, tachy-brady syndrome, chf, aortic atherosclerosis on imaging, GERD, DMII with CKD4 and hyperlipidemia, BPH, macular degeneration and more.    He was hospitalized 10 days for ablation and pacemaker placement.  His stay was complicated by a mild infection at the pacemaker site, chf, acute on chronic renal failure.     He went to rehab at [facility].  He refused PT, quit eating, he lost a bunch of weight per his daughter-in-law.  He had the 14 day quarantine.  He was not progressing so he was discharged home when insurance quit paying.  He's been eating well since he came home--his wife, Darliss Ridgel, is caring for him.  He is requesting to have PT, OT that he had before ([facility]).    Dentures now too big due to weight loss.  He can only eat on the right side.  Can't eat well on  either side.  It's painful when he's chewing.    C/o a RLQ abdominal discomfort, but he does not have it at the moment.  He had one BM since Monday (it's Thursday, but intake is still not a lot even at home, though considerably better than at rehab).   He's taking a lot of ensure.  He had vegetable soup yesterday.  His weight was 115 lbs.  4/9 he weighed 122 lbs.     He is too weak to go to the restroom here for a urine microalbumin safely.  Gaynell says he has told her he wants to go on and die and he confirms that he'd be ok with that.    Past Medical History:  Diagnosis Date  . Chronic kidney disease (CKD), stage III (moderate)   . Diabetes mellitus   . Diverticulosis   . Hard of hearing   . Hemorrhoids   . Hx of adenomatous colonic polyps 2006  . Hypertension   . Persistent atrial fibrillation ([place]) 03/2019  . Pleural effusion 05/20/2019  . Presence of permanent cardiac pacemaker 05/21/2019  . 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 AV NODE ABLATION N/A 05/28/2019   Procedure: AV NODE ABLATION;  Surgeon: Thompson Grayer, MD;  Location: [place] CV LAB;  Service: Cardiovascular;  Laterality: N/A;  . CARDIOVERSION N/A 04/07/2019  Procedure: CARDIOVERSION;  Surgeon: Josue Hector, MD;  Location: [facility] ENDOSCOPY;  Service: Cardiovascular;  Laterality: N/A;  . CARDIOVERSION N/A 05/13/2019   Procedure: CARDIOVERSION;  Surgeon: Skeet Latch, MD;  Location: [facility];  Service: Cardiovascular;  Laterality: N/A;  . CHOLECYSTECTOMY N/A 04/17/2014   Procedure: LAPAROSCOPIC CHOLECYSTECTOMY WITH INTRAOPERATIVE CHOLANGIOGRAM;  Surgeon: Erroll Luna, MD;  Location: [facility];  Service: General;  Laterality: N/A;  . COLONOSCOPY  11/02/2007  . INSERT / REPLACE / REMOVE PACEMAKER    . MAXILLECTOMY Right 2003   SCCA alveolar ridge  . PACEMAKER IMPLANT N/A 05/21/2019   Procedure: PACEMAKER IMPLANT;  Surgeon: Constance Haw,  MD;  Location: [place] CV LAB;  Service: Cardiovascular;  Laterality: N/A;    Allergies  Allergen Reactions  . Tape Other (See Comments)    SKIN IS THIN AND IT TEARS AND BRUISES VERY, VERY EASILY!! PLEASE USE AN ALTERNATIVE.    Outpatient Encounter Medications as of 06/19/2019  Medication Sig  . acetaminophen (TYLENOL) 500 MG tablet Take 1,000 mg by mouth every 8 (eight) hours as needed for mild pain or headache.   . feeding supplement, ENSURE ENLIVE, (ENSURE ENLIVE) LIQD Take 237 mLs by mouth in the morning, at noon, and at bedtime. Due to very poor oral meal intake.  He is high risk for dehydration and malnutrition  . furosemide (LASIX) 40 MG tablet Take 1 tablet (40 mg total) by mouth daily.  . insulin glargine, 2 Unit Dial, (TOUJEO MAX SOLOSTAR) 300 UNIT/ML Solostar Pen Inject 15 Units into the skin daily before breakfast.  . linagliptin (TRADJENTA) 5 MG TABS tablet Take 1 tablet (5 mg total) by mouth daily.  . magnesium hydroxide (MILK OF MAGNESIA) 400 MG/5ML suspension Take 30 mLs by mouth daily as needed for mild constipation.  . NON FORMULARY Diet Change: Dys 2(ground), continue thin liquids (NAS)  . pantoprazole (PROTONIX) 40 MG tablet Take 1 tablet (40 mg total) by mouth 2 (two) times daily before a meal.  . potassium chloride SA (KLOR-CON) 20 MEQ tablet Take 1 tablet (20 mEq total) by mouth daily.  . Rivaroxaban (XARELTO) 15 MG TABS tablet Take 1 tablet (15 mg total) by mouth daily with supper.  . tamsulosin (FLOMAX) 0.4 MG CAPS capsule Take 1 capsule (0.4 mg total) by mouth at bedtime.  . [DISCONTINUED] Balsam [place]-Castor Oil (VENELEX) OINT Apply 1 application topically in the morning, at noon, and at bedtime. Apply to shear left buttock each shift  . [DISCONTINUED] fluconazole (DIFLUCAN) 100 MG tablet Take 1 tablet (100 mg total) by mouth daily for 11 days. Oral Thrush.  . [DISCONTINUED] Multiple Vitamins-Minerals (OCUVITE ADULT 50+) CAPS Take 1 capsule by mouth daily with  breakfast.    No facility-administered encounter medications on file as of 06/19/2019.    Review of Systems:  Review of Systems  Constitutional: Negative for chills and fever.  HENT: Positive for hearing loss. Negative for congestion and sore throat.   Eyes: Negative for blurred vision.  Respiratory: Negative for cough and shortness of breath.   Cardiovascular: Negative for chest pain, palpitations and leg swelling.  Gastrointestinal: Positive for abdominal pain and constipation. Negative for blood in stool, diarrhea and melena.  Genitourinary: Negative for dysuria.  Musculoskeletal: Positive for back pain and joint pain. Negative for falls and myalgias.  Skin:       Pressure injury right heel and thoracic spine  Neurological: Positive for weakness. Negative for dizziness, focal weakness and loss of consciousness.  Endo/Heme/Allergies: Bruises/bleeds easily.  Psychiatric/Behavioral: Positive for depression. The patient is not nervous/anxious and does not have insomnia.     Health Maintenance  Topic Date Due  . URINE MICROALBUMIN  05/03/2019  . INFLUENZA VACCINE  10/05/2019  . HEMOGLOBIN A1C  10/08/2019  . FOOT EXAM  01/23/2020  . OPHTHALMOLOGY EXAM  02/10/2020  . TETANUS/TDAP  04/16/2021  . PNA vac Low Risk Adult  Completed    Physical Exam: There were no vitals filed for this visit. There is no height or weight on file to calculate BMI. Physical Exam Vitals reviewed.  Constitutional:      General: He is not in acute distress.    Appearance: He is ill-appearing. He is not toxic-appearing.     Comments: Cachectic male in wheelchair  HENT:     Head: Normocephalic and atraumatic.     Ears:     Comments: HOH Cardiovascular:     Rate and Rhythm: Normal rate and regular rhythm.     Heart sounds: No murmur.     Comments: Finally rate controlled Pulmonary:     Effort: Pulmonary effort is normal.     Breath sounds: Normal breath sounds. No rales.  Abdominal:     General:  Bowel sounds are normal. There is no distension.     Palpations: Abdomen is soft. There is no mass.     Tenderness: There is no abdominal tenderness. There is no guarding or rebound.  Musculoskeletal:        General: No tenderness. Normal range of motion.     Cervical back: Neck supple.     Right lower leg: No edema.     Left lower leg: No edema.  Skin:    General: Skin is warm and dry.     Comments: Eschar over thoracic spine with surrounding erythema; right heel with deep tissue injury/now blood blister present  Neurological:     General: No focal deficit present.     Mental Status: He is alert.  Psychiatric:     Comments: Flat affect     Labs reviewed: Basic Metabolic Panel: Recent Labs    03/25/19 1522 03/28/19 1137 04/10/19 1407 05/05/19 1500 05/20/19 1955 05/21/19 0335 05/24/19 0232 05/24/19 0232 05/25/19 0600 05/25/19 1548 05/26/19 0413 05/27/19 0313 05/28/19 0925 05/29/19 0206 06/02/19 0819  NA  --    < > 140   < >  --    < > 137   < > 134*   < > 134*   < > 135 135 137  K  --    < > 4.6   < >  --    < > 3.7   < > 3.9   < > 5.3*   < > 4.6 4.6 4.3  CL  --    < > 107   < >  --    < > 99   < > 97*   < > 94*   < > 94* 94* 100  CO2  --    < > 21   < >  --    < > 25   < > 25   < > 28   < > 31 30 29   GLUCOSE  --    < > 216*   < >  --    < > 78   < > 225*   < > 122*   < > 189* 179* 171*  BUN  --    < > 24   < >  --    < >  27*   < > 31*   < > 33*   < > 48* 50* 39*  CREATININE  --    < > 1.94*   < >  --    < > 2.16*   < > 2.26*   < > 2.37*   < > 2.99* 2.88* 2.10*  CALCIUM  --    < > 9.1   < >  --    < > 8.5*   < > 8.2*   < > 8.5*   < > 8.5* 8.5* 8.5*  MG 2.1  --   --   --   --    < > 1.9  --  1.8  --  2.1  --   --   --   --   TSH 4.687*  --  4.06  --  6.636*  --   --   --   --   --   --   --   --   --   --    < > = values in this interval not displayed.   Liver Function Tests: Recent Labs    10/01/18 0848 05/20/19 1955  AST 15 23  ALT 12 14  ALKPHOS  --  74    BILITOT 0.6 0.9  PROT 7.0 6.0*  ALBUMIN  --  2.8*   No results for input(s): LIPASE, AMYLASE in the last 8760 hours. No results for input(s): AMMONIA in the last 8760 hours. CBC: Recent Labs    10/01/18 0848 10/01/18 0848 03/10/19 1342 03/28/19 1137 04/10/19 1407 05/05/19 1500 05/20/19 1552 05/28/19 0925 05/29/19 0206  WBC 9.5   < > 22.1*   < > 6.5   < > 8.1 9.8 8.9  NEUTROABS 6,251  --  19,360*  --  4,680  --   --   --   --   HGB 14.3   < > 10.1*   < > 12.1*   < > 14.4 10.9* 10.2*  HCT 41.6   < > 30.7*   < > 37.9*   < > 46.0 35.3* 33.1*  MCV 92.9   < > 95.6   < > 92.7   < > 92.6 91.9 90.9  PLT 204   < > 561*   < > 316   < > 253 207 223   < > = values in this interval not displayed.   Lipid Panel: Recent Labs    10/01/18 0848  CHOL 86  HDL 47  LDLCALC 25  TRIG 49  CHOLHDL 1.8   Lab Results  Component Value Date   HGBA1C 6.9 (H) 04/10/2019   Assessment/Plan 1. Generalized weakness With failure to thrive s/p prolonged afib with RVR/tachy-brady and even some nonsustained VT -he is doing a little better at home since beginning to eat there and he is willing to try a course of therapy  -we did decide to reassess in a month to see if he's gaining weight and making progress with strengthening -based on his wishes, if he's not at that time (or we note this sooner), I will refer to hospice - Ambulatory referral to Home Health  2. Persistent atrial fibrillation ([place]) - s/p pacer and AV nodal ablation - doing better from the cardiopulmonary perspective - Ambulatory referral to [facility]  3. Tachycardia-bradycardia syndrome ([place]) - s/p pacer and AV nodal ablation with CP improvement, but remains extremely weak with failure to thrive - Ambulatory referral to [facility]  4. CKD  stage 4 due to type 2 diabetes mellitus ([place]) - baseline advanced CKD  - encourage adequate hydration, supplement shakes for protein  - Ambulatory referral to [facility]  5. Pacemaker -in  place and appears this is functioning well - Ambulatory referral to [facility]  6. Goals of care, counseling/discussion -plan is for 4 wks home health therapy and then reassess--if not progressing pt, his wife and his daughter-in-law agree to hospice care  - Do not attempt resuscitation (DNR) order reentered  -spent 20 mins reviewing prognosis given his poor po intake and frail state with pressure injuries and advanced ckd  7.  Right heel deep tissue injury -about 1cm blood blister present on heel at this point -recommended floating heels to prevent progression -nutrition crucial and moving around  8.  Thoracic spine pressure injury--now closed and unstageable =recommended a mepilex or similar cushioned dressing to decrease pressure but primarily moving around and improved nutrition  Labs/tests ordered:  Home health referral of choice entered Next appt:  07/17/2019 f/u on appetite, strength, wounds  Brailen Macneal L. Shatonia Hoots, D.O. [facility] [address], [place] [address] Cell Phone (Mon-Fri 8am-5pm):  [phone] On Call:  [phone] & follow prompts after 5pm & weekends Office Phone:  [phone] Office Fax:  [phone]

## 2019-06-19 NOTE — Patient Instructions (Addendum)
Use a cushioned dressing such as duoderm over the wound on the thoracic spine. Elevate the right foot with a rolled towel or small pillow under the ankle to take pressure off the right heel. If he is dizzy, check his blood sugar and push fluids also.

## 2019-06-20 ENCOUNTER — Telehealth: Payer: Self-pay

## 2019-06-20 NOTE — Telephone Encounter (Signed)
Megan from [facility] called and said, that Trejan Buda does not want them as the home health team for her father she instead would like:  [facility] [place], [place]  [phone]  [name] also called and said that the number and paperwork was given to [place] on 06/19/2019 for [facility] with the phone number attached.

## 2019-06-20 NOTE — Telephone Encounter (Signed)
Yes, I believe that maybe the [facility] referral came from [facility].  I had put in the referral for [facility] yesterday as requested.  It's fine for [facility] to be cancelled.  They had specific staff they worked with with [facility] that got along very well with [place] and got him motivated.

## 2019-06-23 ENCOUNTER — Ambulatory Visit: Payer: Self-pay | Admitting: Internal Medicine

## 2019-06-23 ENCOUNTER — Other Ambulatory Visit: Payer: Self-pay | Admitting: Internal Medicine

## 2019-06-23 ENCOUNTER — Telehealth: Payer: Self-pay

## 2019-06-23 DIAGNOSIS — Z950 Presence of cardiac pacemaker: Secondary | ICD-10-CM

## 2019-06-23 DIAGNOSIS — N184 Chronic kidney disease, stage 4 (severe): Secondary | ICD-10-CM

## 2019-06-23 DIAGNOSIS — I495 Sick sinus syndrome: Secondary | ICD-10-CM

## 2019-06-23 DIAGNOSIS — I4819 Other persistent atrial fibrillation: Secondary | ICD-10-CM

## 2019-06-23 DIAGNOSIS — L89100 Pressure ulcer of unspecified part of back, unstageable: Secondary | ICD-10-CM

## 2019-06-23 DIAGNOSIS — R531 Weakness: Secondary | ICD-10-CM

## 2019-06-23 DIAGNOSIS — R809 Proteinuria, unspecified: Secondary | ICD-10-CM

## 2019-06-23 DIAGNOSIS — E1129 Type 2 diabetes mellitus with other diabetic kidney complication: Secondary | ICD-10-CM

## 2019-06-23 DIAGNOSIS — L89616 Pressure-induced deep tissue damage of right heel: Secondary | ICD-10-CM

## 2019-06-23 DIAGNOSIS — E1122 Type 2 diabetes mellitus with diabetic chronic kidney disease: Secondary | ICD-10-CM

## 2019-06-23 NOTE — Telephone Encounter (Signed)
According to Lattie Haw a new order needs to be place with today's date, it has to be within 48 hours in order for it to be valid.

## 2019-06-23 NOTE — Telephone Encounter (Signed)
The order was entered to  [facility] the day of his appt with me.  The hospital ordered the [facility] and the family already declined that.

## 2019-06-23 NOTE — Telephone Encounter (Signed)
Apolonio Schneiders from [facility] called [phone]).  [facility] were [facility], but the family wants to continue with [facility].  Those orders are currently out of compliance.  They need new orders dated today.  Fax [phone].  Orders for "Skilled nursing to assist" to cover their assessment, plus PT/OT/aide.  They also need a copy of the last office note.

## 2019-06-23 NOTE — Telephone Encounter (Signed)
done

## 2019-06-24 ENCOUNTER — Other Ambulatory Visit (HOSPITAL_COMMUNITY): Payer: Medicare PPO

## 2019-06-25 NOTE — Progress Notes (Signed)
CARDIOLOGY OFFICE NOTE  Date:  07/09/2019    Wesley Garcia Date of Birth: [birth year] Medical Record #[id number]  PCP:  Wesley Curry, DO  Cardiologist:  Wesley Garcia    Chief Complaint  Patient presents with  . Follow-up    History of Present Illness: Wesley Garcia is a 86 y.o. male who presents today for a work in visit. Seen for Wesley Garcia.   He has a history of CKD IV, DM, HTN, squamous cell carcinoma of hard palate, fall with hip fracture s/p surgical repair 02/27/19, andpersistent atrial fibrillation. He is on Xarelto for anticoagulation. He has seen EP as well. He was cardioverted in February - short lived results. Started on amiodarone. He has required PPM implant back in mid March due to possible tachybrady and subsequent AV nodal ablation later in March. Amiodarone has been stopped.   Seen last month by Wesley Garcia - noted hematoma over his device. Otherwise felt to be doing ok. Looks like there was a phone call last week regarding Metoprolol - PCP had noted he was NOT on this by her list.   The patient does not have symptoms concerning for COVID-19 infection (fever, chills, cough, or new shortness of breath).   Comes in today. Here with his daughter in law. She augments the entire history. He is very hard of hearing.  They brought BP readings to review. Running low. She feels like he is getting stronger - doing his therapy. Apparently he really regressed while at rehab - was in the fetal position - very bad/sad situation - they took him home - now has home health and improving. Walking some outside. Wife is 79 and taking care of him. He is not short of breath. Swelling is resolved. He does not like to take the lasix - too much urination. No chest pain. Little dizzy at times. She was not aware of the low EF. Overall, she feels like he is making progress.   Past Medical History:  Diagnosis Date  . Chronic kidney disease (CKD), stage III (moderate)   . Diabetes  mellitus   . Diverticulosis   . Hard of hearing   . Hemorrhoids   . Hx of adenomatous colonic polyps 2006  . Hypertension   . Persistent atrial fibrillation ([place]) 03/2019  . Pleural effusion 05/20/2019  . Presence of permanent cardiac pacemaker 05/21/2019  . Squamous cell carcinoma of hard palate ([place]) 2003    Past Surgical History:  Procedure Laterality Date  . AV NODE ABLATION N/A 05/28/2019   Procedure: AV NODE ABLATION;  Surgeon: Wesley Grayer, MD;  Location: [place];  Service: Cardiovascular;  Laterality: N/A;  . CARDIOVERSION N/A 04/07/2019   Procedure: CARDIOVERSION;  Surgeon: Wesley Hector, MD;  Location: [facility] ENDOSCOPY;  Service: Cardiovascular;  Laterality: N/A;  . CARDIOVERSION N/A 05/13/2019   Procedure: CARDIOVERSION;  Surgeon: Wesley Latch, MD;  Location: [place];  Service: Cardiovascular;  Laterality: N/A;  . CHOLECYSTECTOMY N/A 04/17/2014   Procedure: LAPAROSCOPIC CHOLECYSTECTOMY WITH INTRAOPERATIVE CHOLANGIOGRAM;  Surgeon: Wesley Luna, MD;  Location: [facility];  Service: General;  Laterality: N/A;  . COLONOSCOPY  11/02/2007  . INSERT / REPLACE / REMOVE PACEMAKER    . MAXILLECTOMY Right 2003   SCCA alveolar ridge  . PACEMAKER IMPLANT N/A 05/21/2019   Procedure: PACEMAKER IMPLANT;  Surgeon: Wesley Haw, MD;  Location: [place] [facility];  Service: Cardiovascular;  Laterality: N/A;     Medications: Current Meds  Medication Sig  . acetaminophen (TYLENOL) 500 MG tablet Take 1,000 mg by mouth every 8 (eight) hours as needed for mild pain or headache.   . feeding supplement, ENSURE ENLIVE, (ENSURE ENLIVE) LIQD Take 237 mLs by mouth in the morning, at noon, and at bedtime. Due to very poor oral meal intake.  He is high risk for dehydration and malnutrition  . furosemide (LASIX) 40 MG tablet Take 1 tablet (40 mg total) by mouth daily as needed (weight gain of 2 or more pounds overnight/swelling.).  Marland Kitchen insulin glargine, 2 Unit Dial, (TOUJEO MAX SOLOSTAR)  300 UNIT/ML Solostar Pen Inject 15 Units into the skin daily before breakfast.  . linagliptin (TRADJENTA) 5 MG TABS tablet Take 1 tablet (5 mg total) by mouth daily.  . magnesium hydroxide (MILK OF MAGNESIA) 400 MG/5ML suspension Take 30 mLs by mouth daily as needed for mild constipation.  . NON FORMULARY Diet Change: Dys 2(ground), continue thin liquids (NAS)  . pantoprazole (PROTONIX) 40 MG tablet Take 1 tablet (40 mg total) by mouth 2 (two) times daily before a meal.  . potassium chloride SA (KLOR-CON M20) 20 MEQ tablet Take 1 tablet (20 mEq total) by mouth daily as needed (take only if takes Lasix).  . Rivaroxaban (XARELTO) 15 MG TABS tablet Take 1 tablet (15 mg total) by mouth daily with supper.  . tamsulosin (FLOMAX) 0.4 MG CAPS capsule Take 1 capsule (0.4 mg total) by mouth at bedtime.  . [DISCONTINUED] furosemide (LASIX) 40 MG tablet Take 1 tablet (40 mg total) by mouth daily.  . [DISCONTINUED] KLOR-CON M20 20 MEQ tablet TAKE 1 TABLET BY MOUTH EVERY DAY     Allergies: Allergies  Allergen Reactions  . Tape Other (See Comments)    SKIN IS THIN AND IT TEARS AND BRUISES VERY, VERY EASILY!! PLEASE USE AN ALTERNATIVE.    Social History: The patient  reports that he quit smoking about 19 years ago. His smoking use included cigars. He has never used smokeless tobacco. He reports previous alcohol use. He reports that he does not use drugs.   Family History: The patient's family history includes Bone cancer in his sister; Cancer in his mother; Diabetes in his brother; Kidney disease in his sister.   Review of Systems: Please see the history of present illness.   All other systems are reviewed and negative.   Physical Exam: VS:  BP (!) 102/52   Pulse 82   Ht 5\' 6"  (1.676 m)   Wt 124 lb (56.2 kg)   BMI 20.01 kg/m  .  BMI Body mass index is 20.01 kg/m.  Wt Readings from Last 3 Encounters:  07/09/19 124 lb (56.2 kg)  06/19/19 115 lb 9.6 oz (52.4 kg)  06/13/19 122 lb 6.4 oz (55.5  kg)    General: Alert. Elderly. He is in no acute distress. He is very hard of hearing despite aids in place. He is in a wheelchair.    Cardiac: Heart tones are very distant.  No edema. Legs with multiple skin tears.  Respiratory:  Lungs are clear to auscultation bilaterally with normal work of breathing.  MS: No deformity or atrophy. Gait not tested.  Skin: Warm and dry. Color is normal.  Neuro:  Strength and sensation are intact and no gross focal deficits noted.  Psych: Alert, appropriate and with normal affect.   LABORATORY DATA:  EKG:  EKG is not ordered today.    Lab Results  Component Value Date   WBC 8.9 05/29/2019  HGB 10.2 (L) 05/29/2019   HCT 33.1 (L) 05/29/2019   PLT 223 05/29/2019   GLUCOSE 171 (H) 06/02/2019   CHOL 86 10/01/2018   TRIG 49 10/01/2018   HDL 47 10/01/2018   LDLCALC 25 10/01/2018   ALT 14 05/20/2019   AST 23 05/20/2019   NA 137 06/02/2019   K 4.3 06/02/2019   CL 100 06/02/2019   CREATININE 2.10 (H) 06/02/2019   BUN 39 (H) 06/02/2019   CO2 29 06/02/2019   TSH 6.636 (H) 05/20/2019   HGBA1C 6.9 (H) 04/10/2019   MICROALBUR 1.4 05/02/2018     BNP (last 3 results) Recent Labs    05/20/19 1955  BNP 1,671.4*    ProBNP (last 3 results) No results for input(s): PROBNP in the last 8760 hours.   Other Studies Reviewed Today:  TTE 04/21/2019  1. Septal apical distal anterior wall mid and apical inferior wall  hypokinesis Basal function preserved Abnormal GLS -6. Left ventricular  ejection fraction, by estimation, is 25%. The left ventricle has severely  decreased function. The left ventricle  demonstrates regional wall motion abnormalities (see scoring  diagram/findings for description). The left ventricular internal cavity  size was moderately to severely dilated. Left ventricular diastolic  parameters are indeterminate.  2. Right ventricular systolic function is normal. The right ventricular  size is normal. There is moderately  elevated pulmonary artery systolic  pressure.  3. Left atrial size was mildly dilated.  4. The mitral valve is normal in structure and function. Mild mitral  valve regurgitation. No evidence of mitral stenosis.  5. The aortic valve is tricuspid. Aortic valve regurgitation is trivial.  Mild aortic valve sclerosis is present, with no evidence of aortic valve  stenosis.  6. The inferior vena cava is dilated in size with >50% respiratory  variability, suggesting right atrial pressure of 8 mmHg.    ASSESSMENT AND PLAN:  1. Systolic HF - unclear etiology - ?tachycardia mediated - not on guideline therapy due to soft BP. With his advanced age and DNR status - I do not feel an ischemic work up is warranted at this time. They seem to favor conservative measures. Changing Lasix and potassium to just prn. Lab today.   2. PAF - now with PPM in place and has had AV nodal ablation. No longer on amiodarone.   3. Chronic anticoagulation - needs repeat lab today.   4. General deconditioning - seems to be making progress now with home health/PT - family feels he is returning to his baseline.   5. History of HTN - now pretty soft - he is basically on no medicines. Changing Lasix to prn.   6. DNR  7. Advanced age.   8. Underlying PPM with prior AV node ablation - plan per EP  9. Mild anemia - recheck today.   10. COVID-19 Education: The signs and symptoms of COVID-19 were discussed with the patient and how to seek care for testing (follow up with PCP or arrange E-visit).  The importance of social distancing, staying at home, hand hygiene and wearing a mask when out in public were discussed today.  Current medicines are reviewed with the patient today.  The patient does not have concerns regarding medicines other than what has been noted above.  The following changes have been made:  See above.  Labs/ tests ordered today include:    Orders Placed This Encounter  Procedures  . Basic  metabolic panel  . CBC  . TSH  Disposition:   FU with EP in July as planned - I will see him in August. They do travel from [place], [place].    Patient is agreeable to this plan and will call if any problems develop in the interim.   SignedTruitt Merle, NP  07/09/2019 10:55 AM  [facility] [address] [place], [place]  [address] Phone: [phone] Fax: [phone]

## 2019-07-01 ENCOUNTER — Telehealth: Payer: Self-pay | Admitting: *Deleted

## 2019-07-01 NOTE — Telephone Encounter (Signed)
Apolonio Schneiders Nurse with [facility] called and stated that patient is receiving PT, OT, SN, Medication Management and Nutrition.  Stated that patient is complaining when he has PT and OT with Dizziness when he is getting up and walking. Nurse stated that patient's blood pressure is ranging from 90/50-104/70 at different times.  Stated that patient is taking Metoprolol twice daily and for the most part is compliant.  Patient has a history of falls and just don't want his dizziness to continue or cause a fall.  Nurse is wanting to make you aware and wants to know if you need to adjust his medications. Please Advise.

## 2019-07-01 NOTE — Telephone Encounter (Signed)
Apolonio Schneiders, Nurse with [facility] Notified. Stated that she is going to call patient's Cardiologist to confirm medication with him.

## 2019-07-01 NOTE — Telephone Encounter (Signed)
According to the med list we have, he is NOT on any metoprolol.

## 2019-07-07 ENCOUNTER — Ambulatory Visit: Payer: Medicare PPO | Admitting: Nurse Practitioner

## 2019-07-08 ENCOUNTER — Other Ambulatory Visit: Payer: Self-pay | Admitting: Adult Health

## 2019-07-09 ENCOUNTER — Other Ambulatory Visit: Payer: Self-pay

## 2019-07-09 ENCOUNTER — Ambulatory Visit: Payer: Medicare PPO | Admitting: Nurse Practitioner

## 2019-07-09 ENCOUNTER — Encounter: Payer: Self-pay | Admitting: Nurse Practitioner

## 2019-07-09 ENCOUNTER — Other Ambulatory Visit: Payer: Self-pay | Admitting: Adult Health

## 2019-07-09 VITALS — BP 102/52 | HR 82 | Ht 66.0 in | Wt 124.0 lb

## 2019-07-09 DIAGNOSIS — I97190 Other postprocedural cardiac functional disturbances following cardiac surgery: Secondary | ICD-10-CM

## 2019-07-09 DIAGNOSIS — I5022 Chronic systolic (congestive) heart failure: Secondary | ICD-10-CM | POA: Diagnosis not present

## 2019-07-09 DIAGNOSIS — I4819 Other persistent atrial fibrillation: Secondary | ICD-10-CM

## 2019-07-09 DIAGNOSIS — Z950 Presence of cardiac pacemaker: Secondary | ICD-10-CM | POA: Diagnosis not present

## 2019-07-09 DIAGNOSIS — Z66 Do not resuscitate: Secondary | ICD-10-CM

## 2019-07-09 DIAGNOSIS — I442 Atrioventricular block, complete: Secondary | ICD-10-CM

## 2019-07-09 LAB — BASIC METABOLIC PANEL
BUN/Creatinine Ratio: 19 (ref 10–24)
BUN: 37 mg/dL — ABNORMAL HIGH (ref 8–27)
CO2: 27 mmol/L (ref 20–29)
Calcium: 9.5 mg/dL (ref 8.6–10.2)
Chloride: 96 mmol/L (ref 96–106)
Creatinine, Ser: 1.95 mg/dL — ABNORMAL HIGH (ref 0.76–1.27)
GFR calc Af Amer: 35 mL/min/{1.73_m2} — ABNORMAL LOW (ref >59)
GFR calc non Af Amer: 30 mL/min/{1.73_m2} — ABNORMAL LOW (ref >59)
Glucose: 201 mg/dL — ABNORMAL HIGH (ref 65–99)
Potassium: 4.9 mmol/L (ref 3.5–5.2)
Sodium: 136 mmol/L (ref 134–144)

## 2019-07-09 LAB — CBC
Hematocrit: 35 % — ABNORMAL LOW (ref 37.5–51.0)
Hemoglobin: 11.5 g/dL — ABNORMAL LOW (ref 13.0–17.7)
MCH: 28.4 pg (ref 26.6–33.0)
MCHC: 32.9 g/dL (ref 31.5–35.7)
MCV: 86 fL (ref 79–97)
Platelets: 261 10*3/uL (ref 150–450)
RBC: 4.05 x10E6/uL — ABNORMAL LOW (ref 4.14–5.80)
RDW: 17.1 % — ABNORMAL HIGH (ref 11.6–15.4)
WBC: 8.5 10*3/uL (ref 3.4–10.8)

## 2019-07-09 LAB — TSH: TSH: 16 u[IU]/mL — ABNORMAL HIGH (ref 0.450–4.500)

## 2019-07-09 MED ORDER — POTASSIUM CHLORIDE CRYS ER 20 MEQ PO TBCR: 20.0000 meq | EXTENDED_RELEASE_TABLET | Freq: Every day | ORAL | 1 refills | Status: AC | PRN

## 2019-07-09 MED ORDER — FUROSEMIDE 40 MG PO TABS: 40.0000 mg | ORAL_TABLET | Freq: Every day | ORAL | 3 refills | Status: DC | PRN

## 2019-07-09 NOTE — Patient Instructions (Signed)
After Visit Summary:  We will be checking the following labs today - BMET, CBC, TSh   Medication Instructions:    Continue with your current medicines. BUT  I am changing the Lasix to use just as needed - this will be for weight gain of 2 or more pounds overnight  Change the potassium to take only when you take the Lasix.    If you need a refill on your cardiac medications before your next appointment, please call your pharmacy.     Testing/Procedures To Be Arranged:  N/A  Follow-Up:   See Dr. Curt Bears in July  See me in August    At [facility], you and your health needs are our priority.  As part of our continuing mission to provide you with exceptional heart care, we have created designated Provider Care Teams.  These Care Teams include your primary Cardiologist (physician) and Advanced Practice Providers (APPs -  Physician Assistants and Nurse Practitioners) who all work together to provide you with the care you need, when you need it.  Special Instructions:  . Stay safe, stay home, wash your hands for at least 20 seconds and wear a mask when out in public.  . It was good to talk with you both today.    Call the [place] office at [phone] if you have any questions, problems or concerns.

## 2019-07-10 ENCOUNTER — Other Ambulatory Visit: Payer: Self-pay | Admitting: *Deleted

## 2019-07-10 DIAGNOSIS — N184 Chronic kidney disease, stage 4 (severe): Secondary | ICD-10-CM

## 2019-07-10 MED ORDER — LEVOTHYROXINE SODIUM 25 MCG PO TABS: 25.0000 ug | ORAL_TABLET | Freq: Every day | ORAL | 11 refills | Status: DC

## 2019-07-17 ENCOUNTER — Other Ambulatory Visit: Payer: Self-pay

## 2019-07-17 ENCOUNTER — Encounter: Payer: Self-pay | Admitting: Internal Medicine

## 2019-07-17 ENCOUNTER — Ambulatory Visit (INDEPENDENT_AMBULATORY_CARE_PROVIDER_SITE_OTHER): Payer: Medicare PPO | Admitting: Internal Medicine

## 2019-07-17 VITALS — BP 122/72 | HR 65 | Temp 97.5°F | Ht 66.0 in | Wt 130.0 lb

## 2019-07-17 DIAGNOSIS — M25551 Pain in right hip: Secondary | ICD-10-CM | POA: Diagnosis not present

## 2019-07-17 DIAGNOSIS — R195 Other fecal abnormalities: Secondary | ICD-10-CM

## 2019-07-17 DIAGNOSIS — N184 Chronic kidney disease, stage 4 (severe): Secondary | ICD-10-CM

## 2019-07-17 DIAGNOSIS — R809 Proteinuria, unspecified: Secondary | ICD-10-CM

## 2019-07-17 DIAGNOSIS — M1711 Unilateral primary osteoarthritis, right knee: Secondary | ICD-10-CM | POA: Diagnosis not present

## 2019-07-17 DIAGNOSIS — E1129 Type 2 diabetes mellitus with other diabetic kidney complication: Secondary | ICD-10-CM

## 2019-07-17 DIAGNOSIS — R531 Weakness: Secondary | ICD-10-CM | POA: Diagnosis not present

## 2019-07-17 DIAGNOSIS — E039 Hypothyroidism, unspecified: Secondary | ICD-10-CM

## 2019-07-17 DIAGNOSIS — E1122 Type 2 diabetes mellitus with diabetic chronic kidney disease: Secondary | ICD-10-CM

## 2019-07-17 MED ORDER — METHYLPREDNISOLONE ACETATE 40 MG/ML IJ SUSP
40.0000 mg | Freq: Once | INTRAMUSCULAR | Status: AC
  Administered 2019-07-17: 40 mg via INTRA_ARTICULAR

## 2019-07-17 NOTE — Progress Notes (Signed)
Location:  [facility] clinic Provider:  Mico Spark L. Mariea Clonts, D.O., C.M.D.  Code Status: DNR Goals of Care:  Advanced Directives 07/17/2019  Does Patient Have a Medical Advance Directive? Yes  Type of Advance Directive Out of facility DNR (pink MOST or yellow form);Healthcare Power of Attorney  Does patient want to make changes to medical advance directive? No - Patient declined  Copy of Rosalie in Chart? Yes - validated most recent copy scanned in chart (See row information)  Would patient like information on creating a medical advance directive? -  Pre-existing out of facility DNR order (yellow form or pink MOST form) Pink MOST/Yellow Form most recent copy in chart - Physician notified to receive inpatient order     Chief Complaint  Patient presents with  . Medical Management of Chronic Issues    4 week follow up     HPI: Patient is a 86 y.o. male seen today for medical management of chronic diseases.  He has DMII with CKD, afib now with pacer for tachy-brady syndrome.  He'd fallen in December and fractured his right hip and has not done well.  At his last visit, he was still extremely weak after a prolonged time in rapid afib and then getting his pacemaker placed followed by a rehab stay at [facility].  He was not eating well and not doing his therapy.    He has gained 6 lbs since 07/09/19.  He is eating hot dogs, milk shakes--lots of unhealthy choices, but is gaining weight back.  He ate quite a bit out at [employer] recently for Mother's Day.  Darliss Ridgel (his wife) is shocked he's not gained more weight b/c he is constantly wanting her to get him something to eat.  Has not been taking tradjenta and sugar is fine without it.  Low 100s.  Still using insulin.  Requested samples of long-acting but we do not have any at present.  He fell Thursday going from sunroom into kitchen, put his walker up the step and went down and has an abrasion of his right medial elbow and upper arm  and he had another fall that resulted in another skin tear on the right lower leg/shin.  These were cleansed here and redressed.  No signs of infection present as of this time.    He's on thyroid medication per cardiology--TSH was 16 suddenly--he has not taken it until I approved it for some reason.  Cardiology plans to recheck tsh before they see him again so I will leave that one for them.    He has more energy than last time.  He's been doing his therapy.  The right knee still hurts.  He wants a shot in it for his advanced OA (I've done this before).  That's his main concern for me today (along with fecal incontinence).  Right Hip still hurts him all of the time, he says.  Says it kept him up all night last night.  He's supposed to f/u with Dr. Megan Salon in orthopedics in [place] who did the surgery.  He knows his age and diabetes are going to slow down the healing but it's been almost 5 months (fall happened 12/23--had intertrochanteric fx of right femur and he underwent IM nailing 12/24.)  They are requesting he be seen by ortho in [place].    He is "shitting his pants all the time"--can't get to the bathroom on time.  Can get there sometimes.  It may happen once a day.  Many times it's first thing in the morning.  He'd been on three ensures/boosts per day.  Now, he's having one ensure per day but this still may be contributing.  He's eating a lot of fatty foods, as well.    Past Medical History:  Diagnosis Date  . Chronic kidney disease (CKD), stage III (moderate)   . Diabetes mellitus   . Diverticulosis   . Hard of hearing   . Hemorrhoids   . Hx of adenomatous colonic polyps 2006  . Hypertension   . Persistent atrial fibrillation ([place]) 03/2019  . Pleural effusion 05/20/2019  . Presence of permanent cardiac pacemaker 05/21/2019  . 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 AV NODE ABLATION N/A 05/28/2019   Procedure:  AV NODE ABLATION;  Surgeon: Thompson Grayer, MD;  Location: [place] [facility];  Service: Cardiovascular;  Laterality: N/A;  . CARDIOVERSION N/A 04/07/2019   Procedure: CARDIOVERSION;  Surgeon: Josue Hector, MD;  Location: [facility] ENDOSCOPY;  Service: Cardiovascular;  Laterality: N/A;  . CARDIOVERSION N/A 05/13/2019   Procedure: CARDIOVERSION;  Surgeon: Skeet Latch, MD;  Location: [facility];  Service: Cardiovascular;  Laterality: N/A;  . CHOLECYSTECTOMY N/A 04/17/2014   Procedure: LAPAROSCOPIC CHOLECYSTECTOMY WITH INTRAOPERATIVE CHOLANGIOGRAM;  Surgeon: Erroll Luna, MD;  Location: [facility];  Service: General;  Laterality: N/A;  . COLONOSCOPY  11/02/2007  . INSERT / REPLACE / REMOVE PACEMAKER    . MAXILLECTOMY Right 2003   SCCA alveolar ridge  . PACEMAKER IMPLANT N/A 05/21/2019   Procedure: PACEMAKER IMPLANT;  Surgeon: Constance Haw, MD;  Location: [place] [facility];  Service: Cardiovascular;  Laterality: N/A;    Allergies  Allergen Reactions  . Tape Other (See Comments)    SKIN IS THIN AND IT TEARS AND BRUISES VERY, VERY EASILY!! PLEASE USE AN ALTERNATIVE.    Outpatient Encounter Medications as of 07/17/2019  Medication Sig  . acetaminophen (TYLENOL) 500 MG tablet Take 1,000 mg by mouth every 8 (eight) hours as needed for mild pain or headache.   . feeding supplement, ENSURE ENLIVE, (ENSURE ENLIVE) LIQD Take 237 mLs by mouth in the morning, at noon, and at bedtime. Due to very poor oral meal intake.  He is high risk for dehydration and malnutrition  . furosemide (LASIX) 40 MG tablet Take 1 tablet (40 mg total) by mouth daily as needed (weight gain of 2 or more pounds overnight/swelling.).  Marland Kitchen insulin glargine, 2 Unit Dial, (TOUJEO MAX SOLOSTAR) 300 UNIT/ML Solostar Pen Inject 15 Units into the skin daily before breakfast.  . levothyroxine (SYNTHROID) 25 MCG tablet Take 1 tablet (25 mcg total) by mouth daily before breakfast.  . linagliptin (TRADJENTA) 5 MG TABS tablet Take 1 tablet (5  mg total) by mouth daily.  . magnesium hydroxide (MILK OF MAGNESIA) 400 MG/5ML suspension Take 30 mLs by mouth daily as needed for mild constipation.  . metoprolol succinate (TOPROL-XL) 25 MG 24 hr tablet Take 25 mg by mouth daily.  . NON FORMULARY Diet Change: Dys 2(ground), continue thin liquids (NAS)  . pantoprazole (PROTONIX) 40 MG tablet Take 1 tablet (40 mg total) by mouth 2 (two) times daily before a meal.  . potassium chloride SA (KLOR-CON M20) 20 MEQ tablet Take 1 tablet (20 mEq total) by mouth daily as needed (take only if takes Lasix).  . Rivaroxaban (XARELTO) 15 MG TABS tablet Take 1 tablet (15 mg total) by mouth daily with supper.  Marland Kitchen  tamsulosin (FLOMAX) 0.4 MG CAPS capsule Take 1 capsule (0.4 mg total) by mouth at bedtime.   No facility-administered encounter medications on file as of 07/17/2019.    Review of Systems:  Review of Systems  Constitutional: Positive for malaise/fatigue. Negative for chills, fever and weight loss.       Has gained weight since last visit :)  HENT: Positive for hearing loss. Negative for congestion and sore throat.        Got new hearing aids that work well, but rechargeable and they forgot to charge them last night so he is w/o them today   Eyes: Negative for blurred vision.  Respiratory: Negative for cough and shortness of breath.   Cardiovascular: Negative for chest pain, palpitations and leg swelling.  Gastrointestinal: Positive for diarrhea. Negative for abdominal pain, blood in stool, constipation and melena.  Genitourinary: Negative for dysuria.  Musculoskeletal: Positive for falls and joint pain.       Right hip and right knee  Skin: Negative for itching and rash.  Neurological: Positive for weakness. Negative for dizziness and loss of consciousness.       Right knee likes to give out by his report  Endo/Heme/Allergies: Bruises/bleeds easily.  Psychiatric/Behavioral: Positive for depression and memory loss. The patient is not  nervous/anxious and does not have insomnia.        Cognition not as clear as it had been prior to this hip fx    Health Maintenance  Topic Date Due  . URINE MICROALBUMIN  05/03/2019  . COVID-19 Vaccine (2 - Pfizer 2-dose series) 05/22/2019  . INFLUENZA VACCINE  10/05/2019  . HEMOGLOBIN A1C  10/08/2019  . FOOT EXAM  01/23/2020  . OPHTHALMOLOGY EXAM  02/10/2020  . TETANUS/TDAP  04/16/2021  . PNA vac Low Risk Adult  Completed    Physical Exam: Vitals:   07/17/19 1133  Weight: 130 lb (59 kg)  Height: 5\' 6"  (1.676 m)   Body mass index is 20.98 kg/m. Physical Exam Vitals reviewed.  Constitutional:      General: He is not in acute distress.    Comments: Chronically ill and frail-appearing but not cachectic like he did look   HENT:     Ears:     Comments: VERY HOH w/o hearing aids in (his wife can actually hear better than he can today) Cardiovascular:     Rate and Rhythm: Normal rate and regular rhythm.  Pulmonary:     Effort: Pulmonary effort is normal.     Breath sounds: Normal breath sounds. No wheezing, rhonchi or rales.  Abdominal:     General: Bowel sounds are normal. There is no distension.     Palpations: Abdomen is soft.     Tenderness: There is no abdominal tenderness. There is no guarding or rebound.  Musculoskeletal:     Right lower leg: No edema.     Left lower leg: No edema.     Comments: Tender over right knee and right posterolateral hip  Skin:    Comments: Skin tear of right shin and arm--no surrounding erythema or warmth, only bloody drainage  Neurological:     General: No focal deficit present.     Mental Status: He is alert and oriented to person, place, and time.     Motor: Weakness present.     Gait: Gait abnormal.  Psychiatric:     Comments: Grumpy today     Labs reviewed: Basic Metabolic Panel: Recent Labs    04/10/19 1407 05/05/19 1500  05/20/19 1955 05/21/19 0335 05/24/19 0232 05/24/19 0232 05/25/19 0600 05/25/19 1548  05/26/19 0413 05/27/19 0313 05/29/19 0206 06/02/19 0819 07/09/19 1054  NA 140   < >  --    < > 137   < > 134*   < > 134*   < > 135 137 136  K 4.6   < >  --    < > 3.7   < > 3.9   < > 5.3*   < > 4.6 4.3 4.9  CL 107   < >  --    < > 99   < > 97*   < > 94*   < > 94* 100 96  CO2 21   < >  --    < > 25   < > 25   < > 28   < > 30 29 27   GLUCOSE 216*   < >  --    < > 78   < > 225*   < > 122*   < > 179* 171* 201*  BUN 24   < >  --    < > 27*   < > 31*   < > 33*   < > 50* 39* 37*  CREATININE 1.94*   < >  --    < > 2.16*   < > 2.26*   < > 2.37*   < > 2.88* 2.10* 1.95*  CALCIUM 9.1   < >  --    < > 8.5*   < > 8.2*   < > 8.5*   < > 8.5* 8.5* 9.5  MG  --   --   --    < > 1.9  --  1.8  --  2.1  --   --   --   --   TSH 4.06  --  6.636*  --   --   --   --   --   --   --   --   --  16.000*   < > = values in this interval not displayed.   Liver Function Tests: Recent Labs    10/01/18 0848 05/20/19 1955  AST 15 23  ALT 12 14  ALKPHOS  --  74  BILITOT 0.6 0.9  PROT 7.0 6.0*  ALBUMIN  --  2.8*   No results for input(s): LIPASE, AMYLASE in the last 8760 hours. No results for input(s): AMMONIA in the last 8760 hours. CBC: Recent Labs    10/01/18 0848 10/01/18 0848 03/10/19 1342 03/28/19 1137 04/10/19 1407 05/05/19 1500 05/28/19 0925 05/29/19 0206 07/09/19 1054  WBC 9.5   < > 22.1*   < > 6.5   < > 9.8 8.9 8.5  NEUTROABS 6,251  --  19,360*  --  4,680  --   --   --   --   HGB 14.3   < > 10.1*   < > 12.1*   < > 10.9* 10.2* 11.5*  HCT 41.6   < > 30.7*   < > 37.9*   < > 35.3* 33.1* 35.0*  MCV 92.9   < > 95.6   < > 92.7   < > 91.9 90.9 86  PLT 204   < > 561*   < > 316   < > 207 223 261   < > = values in this interval not displayed.   Lipid Panel: Recent Labs    10/01/18 0848  CHOL 86  HDL  26  LDLCALC 25  TRIG 49  CHOLHDL 1.8   Lab Results  Component Value Date   HGBA1C 6.9 (H) 04/10/2019    Procedures since last visit: No results found.  Assessment/Plan 1. Right hip pain -  he says it's never improved, but his wife and daughter seem surprised that he's reporting this--he had not been complaining of it regularly (? Repeat injury now with falls) -he wants to see ortho in [place] rather than returning to [facility] ortho - AMB referral to orthopedics  2. Loose stools -suspect a role of fatty food intake and supplement shakes--advised to only use shakes if he skips a meal since eating better now  3. Generalized weakness - improving gradually s/p pacer and resolution of RVR from afib and tx of hypothyroidism  -cont home health therapy - CBC with Differential/Platelet; Future  4. Controlled type 2 diabetes mellitus with microalbuminuria, without long-term current use of insulin (HCC) -control is good for him--intake improved also -not using tradjenta due to CBGs all under 200 and cost of drug Lab Results  Component Value Date   HGBA1C 7.3 (H) 07/17/2019   - CBC with Differential/Platelet; Future - Hemoglobin A1c; Future  5. CKD stage 4 due to type 2 diabetes mellitus (HCC) -Avoid nephrotoxic agents like nsaids, dose adjust renally excreted meds, hydrate. - Hemoglobin A1c - COMPLETE METABOLIC PANEL WITH GFR; Future  6. Acquired hypothyroidism - cont levothyroxine and f/u lab as planned - TSH; Future  7. Primary osteoarthritis of right knee -not responding to otc tylenol or topical tx or home PT -Procedure note right knee injection verbal consent was obtained to inject right knee joint Timeout was completed to confirm the site of injection Area was cleansed with betadine on medial aspect of knee x 2 The medications used were 40 mg of Depo-Medrol and 1% lidocaine 2 cc After cleaning the skin and spraying with topical bupivicaine anesthetic, a 20-gauge needle was used to inject the right knee joint. There were no complications. A sterile bandage was applied.  Labs/tests ordered:   Orders Placed This Encounter  Procedures  . Hemoglobin A1c    Order Specific  Question:   Release to patient    Answer:   Immediate  . CBC with Differential/Platelet    Standing Status:   Future    Standing Expiration Date:   07/16/2020  . COMPLETE METABOLIC PANEL WITH GFR    Standing Status:   Future    Standing Expiration Date:   07/16/2020  . Hemoglobin A1c    Standing Status:   Future    Standing Expiration Date:   07/16/2020    Order Specific Question:   Release to patient    Answer:   Immediate  . TSH    Standing Status:   Future    Standing Expiration Date:   07/16/2020  . AMB referral to orthopedics    Referral Priority:   Routine    Referral Type:   Consultation    Referred to Provider:   Mcarthur Rossetti, MD    Number of Visits Requested:   1   Next appt:  01/02/2020   Tiersa Dayley L. Tyrell Seifer, D.O. [facility] [address], [place] [address] Cell Phone (Mon-Fri 8am-5pm):  [phone] On Call:  [phone] & follow prompts after 5pm & weekends Office Phone:  [phone] Office Fax:  [phone]

## 2019-07-18 LAB — HEMOGLOBIN A1C
Hgb A1c MFr Bld: 7.3 % of total Hgb — ABNORMAL HIGH (ref <5.7)
Mean Plasma Glucose: 163 (calc)
eAG (mmol/L): 9 (calc)

## 2019-07-18 NOTE — Progress Notes (Signed)
Please notify his daughter-in-law:  Sugar average has trended up to 7.3 but that is still really good with all of the problems Wesley Garcia has been having.  Continue the insulin and stay off the tradjenta as discussed yesterday.

## 2019-07-21 ENCOUNTER — Other Ambulatory Visit: Payer: Self-pay

## 2019-07-21 ENCOUNTER — Telehealth: Payer: Self-pay

## 2019-07-21 MED ORDER — TOUJEO MAX SOLOSTAR 300 UNIT/ML ~~LOC~~ SOPN: 15.0000 [IU] | PEN_INJECTOR | Freq: Every day | SUBCUTANEOUS | 11 refills | Status: DC

## 2019-07-21 MED ORDER — TOUJEO MAX SOLOSTAR 300 UNIT/ML ~~LOC~~ SOPN: 15.0000 [IU] | PEN_INJECTOR | Freq: Every day | SUBCUTANEOUS | 2 refills | Status: AC

## 2019-07-21 NOTE — Telephone Encounter (Signed)
Insulin prescription is needed and would like it called to [facility]. He is completely out and forgot to address it at visit. Can be reached at [phone]

## 2019-07-21 NOTE — Telephone Encounter (Signed)
Incoming call received from patients wife requesting a refill. Per Wesley Garcia this was to be done last week yet the pharmacy never received rx.   RX sent as requested for [name]

## 2019-08-19 ENCOUNTER — Telehealth: Payer: Self-pay

## 2019-08-19 NOTE — Telephone Encounter (Signed)
Patients daughter [name] called questioning if patient has a DNR on file. I advised yes.  [name] states patient was told that in the event that he needs to be hospitalized if he does not have a copy of DNR present he would be taken to a hospital in [place] vs [facility]. Patients preference is [place] plans to look for copy of DNR in patients home and if she is unable to find she will call back to have Dr.Reed complete another one.

## 2019-08-21 ENCOUNTER — Ambulatory Visit (INDEPENDENT_AMBULATORY_CARE_PROVIDER_SITE_OTHER): Payer: Medicare PPO | Admitting: *Deleted

## 2019-08-21 DIAGNOSIS — I495 Sick sinus syndrome: Secondary | ICD-10-CM

## 2019-08-21 LAB — CUP PACEART REMOTE DEVICE CHECK
Battery Remaining Longevity: 32 mo
Battery Remaining Percentage: 95.5 %
Battery Voltage: 2.96 V
Brady Statistic AP VP Percent: 92 %
Brady Statistic AP VS Percent: 1 %
Brady Statistic AS VP Percent: 8.4 %
Brady Statistic AS VS Percent: 1 %
Brady Statistic RA Percent Paced: 90 %
Brady Statistic RV Percent Paced: 99 %
Date Time Interrogation Session: 20210617020014
Implantable Lead Implant Date: 20210317
Implantable Lead Implant Date: 20210317
Implantable Lead Location: 753859
Implantable Lead Location: 753860
Implantable Pulse Generator Implant Date: 20210317
Lead Channel Impedance Value: 430 Ohm
Lead Channel Impedance Value: 510 Ohm
Lead Channel Pacing Threshold Amplitude: 0.75 V
Lead Channel Pacing Threshold Amplitude: 1.75 V
Lead Channel Pacing Threshold Pulse Width: 0.5 ms
Lead Channel Pacing Threshold Pulse Width: 1 ms
Lead Channel Sensing Intrinsic Amplitude: 12 mV
Lead Channel Sensing Intrinsic Amplitude: 2.3 mV
Lead Channel Setting Pacing Amplitude: 5 V
Lead Channel Setting Pacing Amplitude: 5 V
Lead Channel Setting Pacing Pulse Width: 0.5 ms
Lead Channel Setting Sensing Sensitivity: 4 mV
Pulse Gen Model: 2272
Pulse Gen Serial Number: 3803323

## 2019-08-21 NOTE — Progress Notes (Signed)
Remote pacemaker transmission.   

## 2019-08-26 ENCOUNTER — Encounter: Payer: Medicare PPO | Admitting: Cardiology

## 2019-08-27 ENCOUNTER — Other Ambulatory Visit (HOSPITAL_COMMUNITY): Payer: Self-pay | Admitting: Physician Assistant

## 2019-08-27 DIAGNOSIS — I495 Sick sinus syndrome: Secondary | ICD-10-CM

## 2019-09-04 ENCOUNTER — Other Ambulatory Visit: Payer: Medicare PPO | Admitting: *Deleted

## 2019-09-04 ENCOUNTER — Other Ambulatory Visit: Payer: Self-pay

## 2019-09-04 ENCOUNTER — Encounter: Payer: Self-pay | Admitting: Cardiology

## 2019-09-04 ENCOUNTER — Ambulatory Visit: Payer: Medicare PPO | Admitting: Cardiology

## 2019-09-04 VITALS — BP 100/55 | HR 82 | Ht 66.0 in | Wt 133.0 lb

## 2019-09-04 DIAGNOSIS — I4819 Other persistent atrial fibrillation: Secondary | ICD-10-CM

## 2019-09-04 DIAGNOSIS — I472 Ventricular tachycardia: Secondary | ICD-10-CM

## 2019-09-04 DIAGNOSIS — I4729 Other ventricular tachycardia: Secondary | ICD-10-CM

## 2019-09-04 DIAGNOSIS — N184 Chronic kidney disease, stage 4 (severe): Secondary | ICD-10-CM

## 2019-09-04 LAB — CUP PACEART INCLINIC DEVICE CHECK
Battery Remaining Longevity: 70 mo
Battery Voltage: 2.96 V
Brady Statistic RA Percent Paced: 90 %
Brady Statistic RV Percent Paced: 99.98 %
Date Time Interrogation Session: 20210701135500
Implantable Lead Implant Date: 20210317
Implantable Lead Implant Date: 20210317
Implantable Lead Location: 753859
Implantable Lead Location: 753860
Implantable Pulse Generator Implant Date: 20210317
Lead Channel Impedance Value: 412.5 Ohm
Lead Channel Impedance Value: 512.5 Ohm
Lead Channel Pacing Threshold Amplitude: 0.75 V
Lead Channel Pacing Threshold Amplitude: 1 V
Lead Channel Pacing Threshold Pulse Width: 0.5 ms
Lead Channel Pacing Threshold Pulse Width: 0.8 ms
Lead Channel Sensing Intrinsic Amplitude: 2.8 mV
Lead Channel Setting Pacing Amplitude: 2 V
Lead Channel Setting Pacing Amplitude: 2.5 V
Lead Channel Setting Pacing Pulse Width: 0.5 ms
Lead Channel Setting Sensing Sensitivity: 4 mV
Pulse Gen Model: 2272
Pulse Gen Serial Number: 3803323

## 2019-09-04 NOTE — Progress Notes (Signed)
Electrophysiology Office Note   Date:  09/04/2019   ID:  Wesley Garcia, DOB Nov 05, 1929, MRN [id number]  PCP:  Gayland Curry, DO  Cardiologist:  Irish Lack Primary Electrophysiologist:  Adriann Thau Meredith Leeds, MD    Chief Complaint: AF   History of Present Illness: Wesley Garcia is a 86 y.o. male who is being seen today for the evaluation of AF at the request of Gayland Curry, DO. Presenting today for electrophysiology evaluation.  He has a history of persistent atrial fibrillation, hypertension, diabetes, stage IV CKD who presented to the hospital with rapid atrial fibrillation.  He was continued on amiodarone.  He converted to sinus rhythm.  Due to the possibility of tachybradycardia syndrome, Saint Jude dual-chamber pacemaker was implanted on 05/21/2019.  He went into atrial fibrillation, and with difficult to control rapid rates, he had an AV node ablation on 05/28/2019.    Today, denies symptoms of palpitations, chest pain, shortness of breath, orthopnea, PND, lower extremity edema, claudication, dizziness, presyncope, syncope, bleeding, or neurologic sequela. The patient is tolerating medications without difficulties.  Overall he is doing well.  He has no chest pain or shortness of breath.  Is able to do all of his daily activities with restrictions due to orthopedic complaints.  He did have a fall 3 weeks ago and had bruising on his face.  He does have follow-up with orthopedics coming up for knee and hip issues.   Past Medical History:  Diagnosis Date  . Chronic kidney disease (CKD), stage III (moderate)   . Diabetes mellitus   . Diverticulosis   . Hard of hearing   . Hemorrhoids   . Hx of adenomatous colonic polyps 2006  . Hypertension   . Persistent atrial fibrillation ([place]) 03/2019  . Pleural effusion 05/20/2019  . Presence of permanent cardiac pacemaker 05/21/2019  . 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 AV  NODE ABLATION N/A 05/28/2019   Procedure: AV NODE ABLATION;  Surgeon: Thompson Grayer, MD;  Location: [place] [facility];  Service: Cardiovascular;  Laterality: N/A;  . CARDIOVERSION N/A 04/07/2019   Procedure: CARDIOVERSION;  Surgeon: Josue Hector, MD;  Location: [facility] ENDOSCOPY;  Service: Cardiovascular;  Laterality: N/A;  . CARDIOVERSION N/A 05/13/2019   Procedure: CARDIOVERSION;  Surgeon: Skeet Latch, MD;  Location: [place];  Service: Cardiovascular;  Laterality: N/A;  . CHOLECYSTECTOMY N/A 04/17/2014   Procedure: LAPAROSCOPIC CHOLECYSTECTOMY WITH INTRAOPERATIVE CHOLANGIOGRAM;  Surgeon: Erroll Luna, MD;  Location: [place];  Service: General;  Laterality: N/A;  . COLONOSCOPY  11/02/2007  . INSERT / REPLACE / REMOVE PACEMAKER    . MAXILLECTOMY Right 2003   SCCA alveolar ridge  . PACEMAKER IMPLANT N/A 05/21/2019   Procedure: PACEMAKER IMPLANT;  Surgeon: Constance Haw, MD;  Location: [place] [facility];  Service: Cardiovascular;  Laterality: N/A;     Current Outpatient Medications  Medication Sig Dispense Refill  . acetaminophen (TYLENOL) 500 MG tablet Take 1,000 mg by mouth every 8 (eight) hours as needed for mild pain or headache.     . feeding supplement, ENSURE ENLIVE, (ENSURE ENLIVE) LIQD Take 237 mLs by mouth in the morning, at noon, and at bedtime. Due to very poor oral meal intake.  He is high risk for dehydration and malnutrition    . furosemide (LASIX) 40 MG tablet Take 1 tablet (40 mg total) by mouth daily as needed (weight gain of 2 or more pounds  overnight/swelling.). 30 tablet 3  . insulin glargine, 2 Unit Dial, (TOUJEO MAX SOLOSTAR) 300 UNIT/ML Solostar Pen Inject 15 Units into the skin daily before breakfast. E11.22 15 mL 2  . levothyroxine (SYNTHROID) 25 MCG tablet Take 1 tablet (25 mcg total) by mouth daily before breakfast. 30 tablet 11  . magnesium hydroxide (MILK OF MAGNESIA) 400 MG/5ML suspension Take 30 mLs by mouth daily as needed for mild constipation.    .  metoprolol succinate (TOPROL-XL) 25 MG 24 hr tablet Take 25 mg by mouth daily.    . NON FORMULARY Diet Change: Dys 2(ground), continue thin liquids (NAS)    . pantoprazole (PROTONIX) 40 MG tablet Take 1 tablet (40 mg total) by mouth 2 (two) times daily before a meal. 60 tablet 0  . potassium chloride SA (KLOR-CON M20) 20 MEQ tablet Take 1 tablet (20 mEq total) by mouth daily as needed (take only if takes Lasix). 90 tablet 1  . Rivaroxaban (XARELTO) 15 MG TABS tablet Take 1 tablet (15 mg total) by mouth daily with supper. 30 tablet 0  . tamsulosin (FLOMAX) 0.4 MG CAPS capsule Take 1 capsule (0.4 mg total) by mouth at bedtime. 30 capsule 0   No current facility-administered medications for this visit.    Allergies:   Tape   Social History:  The patient  reports that he quit smoking about 19 years ago. His smoking use included cigars. He has never used smokeless tobacco. He reports previous alcohol use. He reports that he does not use drugs.   Family History:  The patient's family history includes Bone cancer in his sister; Cancer in his mother; Diabetes in his brother; Kidney disease in his sister.    ROS:  Please see the history of present illness.   Otherwise, review of systems is positive for none.   All other systems are reviewed and negative.   PHYSICAL EXAM: VS:  BP (!) 100/55   Pulse 82   Ht 5\' 6"  (1.676 m)   Wt 133 lb (60.3 kg)   BMI 21.47 kg/m  , BMI Body mass index is 21.47 kg/m. GEN: Well nourished, well developed, in no acute distress  HEENT: normal  Neck: no JVD, carotid bruits, or masses Cardiac: RRR; no murmurs, rubs, or gallops,no edema  Respiratory:  clear to auscultation bilaterally, normal work of breathing GI: soft, nontender, nondistended, + BS MS: no deformity or atrophy  Skin: warm and dry, device site well healed Neuro:  Strength and sensation are intact Psych: euthymic mood, full affect  EKG:  EKG is ordered today. Personal review of the ekg ordered shows  AV paced  Personal review of the device interrogation today. Results in [place]: 05/20/2019: ALT 14; B Natriuretic Peptide 1,671.4 05/26/2019: Magnesium 2.1 07/09/2019: BUN 37; Creatinine, Ser 1.95; Hemoglobin 11.5; Platelets 261; Potassium 4.9; Sodium 136; TSH 16.000    Lipid Panel     Component Value Date/Time   CHOL 86 10/01/2018 0848   CHOL 95 (L) 05/07/2014 0813   TRIG 49 10/01/2018 0848   HDL 47 10/01/2018 0848   HDL 65 05/07/2014 0813   CHOLHDL 1.8 10/01/2018 0848   LDLCALC 25 10/01/2018 0848     Wt Readings from Last 3 Encounters:  09/04/19 133 lb (60.3 kg)  07/17/19 130 lb (59 kg)  07/09/19 124 lb (56.2 kg)      Other studies Reviewed: Additional studies/ records that were reviewed today include: TTE 04/21/2019 Review of the above records today demonstrates:  1. Septal apical distal anterior wall mid and apical inferior wall  hypokinesis Basal function preserved Abnormal GLS -6. Left ventricular  ejection fraction, by estimation, is 25%. The left ventricle has severely  decreased function. The left ventricle  demonstrates regional wall motion abnormalities (see scoring  diagram/findings for description). The left ventricular internal cavity  size was moderately to severely dilated. Left ventricular diastolic  parameters are indeterminate.  2. Right ventricular systolic function is normal. The right ventricular  size is normal. There is moderately elevated pulmonary artery systolic  pressure.  3. Left atrial size was mildly dilated.  4. The mitral valve is normal in structure and function. Mild mitral  valve regurgitation. No evidence of mitral stenosis.  5. The aortic valve is tricuspid. Aortic valve regurgitation is trivial.  Mild aortic valve sclerosis is present, with no evidence of aortic valve  stenosis.  6. The inferior vena cava is dilated in size with >50% respiratory  variability, suggesting right atrial pressure of 8 mmHg.     ASSESSMENT AND PLAN:  1.  Tachybradycardia syndrome: Status post Saint Jude dual-chamber pacemaker implanted 05/21/2019.  Device functioning appropriately.  Atrial outputs and base rate have been changed.  Base rate change to 60 bpm.  2.  Persistent atrial fibrillation: Status post AV node ablation 05/28/2019.  Currently on Xarelto.  CHA2DS2-VASc of 4.    3.  Chronic systolic heart failure: Currently on metoprolol and Lasix.  No obvious volume overload.    Current medicines are reviewed at length with the patient today.   The patient does not have concerns regarding his medicines.  The following changes were made today: None  Labs/ tests ordered today include:  Orders Placed This Encounter  Procedures  . EKG 12-Lead     Disposition:   FU with Lavette Yankovich 9 months  Signed, Judithann Villamar Meredith Leeds, MD  09/04/2019 2:18 PM     [address] [place] [address] [phone] (office) [phone] (fax)

## 2019-09-04 NOTE — Patient Instructions (Signed)
Medication Instructions:  Your physician recommends that you continue on your current medications as directed. Please refer to the Current Medication list given to you today.  *If you need a refill on your cardiac medications before your next appointment, please call your pharmacy*   Lab Work: None ordered If you have labs (blood work) drawn today and your tests are completely normal, you will receive your results only by: Marland Kitchen MyChart Message (if you have MyChart) OR . A paper copy in the mail If you have any lab test that is abnormal or we need to change your treatment, we will call you to review the results.   Testing/Procedures: None ordered   Follow-Up: Remote monitoring is used to monitor your Pacemaker of ICD from home. This monitoring reduces the number of office visits required to check your device to one time per year. It allows [place] to keep an eye on the functioning of your device to ensure it is working properly. You are scheduled for a device check from home on 11/20/2019. You may send your transmission at any time that day. If you have a wireless device, the transmission will be sent automatically. After your physician reviews your transmission, you will receive a postcard with your next transmission date.  At [facility], you and your health needs are our priority.  As part of our continuing mission to provide you with exceptional heart care, we have created designated Provider Care Teams.  These Care Teams include your primary Cardiologist (physician) and Advanced Practice Providers (APPs -  Physician Assistants and Nurse Practitioners) who all work together to provide you with the care you need, when you need it.  We recommend signing up for the patient portal called "MyChart".  Sign up information is provided on this After Visit Summary.  MyChart is used to connect with patients for Virtual Visits (Telemedicine).  Patients are able to view lab/test results, encounter notes,  upcoming appointments, etc.  Non-urgent messages can be sent to your provider as well.   To learn more about what you can do with MyChart, go to [email or web address].    Your next appointment:   9 month(s)  The format for your next appointment:   In Person  Provider:   Allegra Lai, MD   Thank you for choosing [facility]!!   Trinidad Curet, RN [phone]    Other Instructions

## 2019-09-05 ENCOUNTER — Telehealth: Payer: Self-pay | Admitting: *Deleted

## 2019-09-05 LAB — TSH: TSH: 11.2 u[IU]/mL — ABNORMAL HIGH (ref 0.450–4.500)

## 2019-09-05 MED ORDER — LEVOTHYROXINE SODIUM 50 MCG PO TABS
50.0000 ug | ORAL_TABLET | Freq: Every day | ORAL | 6 refills | Status: DC
Start: 2019-09-05 — End: 2020-03-16

## 2019-09-05 NOTE — Telephone Encounter (Signed)
Patient's daughter in law notified. Will [facility] [place].  Patient's PCP is in [place]. Patient is seeing Truitt Merle, NP on August 10,2021 and I told daughter in law it should be OK to have TSH rechecked at this visit.

## 2019-09-05 NOTE — Telephone Encounter (Signed)
Yes.  Cecille Rubin

## 2019-09-05 NOTE — Telephone Encounter (Signed)
-----   Message from Burtis Junes, NP sent at 09/05/2019  9:55 AM EDT ----- Please call - probably need to speak to daughter.  Thyroid function remains low - needs to increase Synthroid to 50 mcg a day. TSH in 6 to 8 weeks - can do this with his PCP due to where he lives.  Copy to PCP please.

## 2019-09-08 ENCOUNTER — Other Ambulatory Visit: Payer: Self-pay | Admitting: Internal Medicine

## 2019-09-08 DIAGNOSIS — N410 Acute prostatitis: Secondary | ICD-10-CM

## 2019-09-09 NOTE — Telephone Encounter (Signed)
Patient is requesting refill on medication "Tamsulosin 0.4mg ". Medication was last filled by Ok Edwards, NP. Not sure if this medication is long term. Medication pend and sent to PCP Mariea Clonts, Tiffany L, DO. Please Advise.

## 2019-09-11 ENCOUNTER — Other Ambulatory Visit (HOSPITAL_COMMUNITY): Payer: Self-pay | Admitting: Physician Assistant

## 2019-09-11 DIAGNOSIS — I495 Sick sinus syndrome: Secondary | ICD-10-CM

## 2019-09-13 ENCOUNTER — Other Ambulatory Visit (HOSPITAL_COMMUNITY): Payer: Self-pay | Admitting: Physician Assistant

## 2019-09-13 DIAGNOSIS — I495 Sick sinus syndrome: Secondary | ICD-10-CM

## 2019-09-15 ENCOUNTER — Encounter: Payer: Self-pay | Admitting: Orthopaedic Surgery

## 2019-09-15 ENCOUNTER — Ambulatory Visit (INDEPENDENT_AMBULATORY_CARE_PROVIDER_SITE_OTHER): Payer: Medicare PPO

## 2019-09-15 ENCOUNTER — Other Ambulatory Visit: Payer: Self-pay

## 2019-09-15 ENCOUNTER — Ambulatory Visit: Payer: Medicare PPO | Admitting: Orthopaedic Surgery

## 2019-09-15 DIAGNOSIS — M1711 Unilateral primary osteoarthritis, right knee: Secondary | ICD-10-CM | POA: Insufficient documentation

## 2019-09-15 DIAGNOSIS — S72141S Displaced intertrochanteric fracture of right femur, sequela: Secondary | ICD-10-CM | POA: Diagnosis not present

## 2019-09-15 DIAGNOSIS — M25551 Pain in right hip: Secondary | ICD-10-CM | POA: Diagnosis not present

## 2019-09-15 DIAGNOSIS — M25561 Pain in right knee: Secondary | ICD-10-CM

## 2019-09-15 DIAGNOSIS — S72141A Displaced intertrochanteric fracture of right femur, initial encounter for closed fracture: Secondary | ICD-10-CM | POA: Insufficient documentation

## 2019-09-15 DIAGNOSIS — M25559 Pain in unspecified hip: Secondary | ICD-10-CM | POA: Diagnosis not present

## 2019-09-15 MED ORDER — METOPROLOL SUCCINATE ER 25 MG PO TB24: 25.0000 mg | ORAL_TABLET | Freq: Every day | ORAL | 1 refills | Status: DC

## 2019-09-15 NOTE — Telephone Encounter (Signed)
Incoming call received from [name] inquiring about why we have denied refill request for Metoprolol.  I informed [name] that we have not received a refill request for metoprolol and it was denied by his cardiologist.   [name] asked that we fill at this time.  Last OV with Dr.Reed 07/17/2019, RX sent as requested, pharmacy confirmed.

## 2019-09-15 NOTE — Progress Notes (Signed)
Office Visit Note   Patient: Wesley Garcia           Date of Birth: 1930/01/30           MRN: [id number] Visit Date: 09/15/2019              Requested by: Gayland Curry, DO [place],  [place] [address] PCP: Gayland Curry, DO   Assessment & Plan: Visit Diagnoses:  1. Hip pain   2. Right knee pain, unspecified chronicity   3. Closed comminuted intertrochanteric fracture of right femur, sequela   4. Unilateral primary osteoarthritis, right knee     Plan: I talked to the family in length and there is really nothing else that should be done for the right hip.  This is a healing hip fracture and it was definitely fixed but he is likely experiencing shortening on that side.  There is no evidence of collapse of the hardware or that the fracture is not healed.  His right knee does have osteoarthritis and at this point given failed conservative treatment including steroid injections we we will order hyaluronic acid to try for the right knee.  The family is agreeing with this treatment plan.  We will see him back in follow-up once this is approved and we have the gel injection here.  All questions and concerns were answered and addressed.  Follow-Up Instructions: No follow-ups on file.   Orders:  Orders Placed This Encounter  Procedures  . XR HIP UNILAT W OR W/O PELVIS 1V RIGHT  . XR Knee 1-2 Views Right   No orders of the defined types were placed in this encounter.     Procedures: No procedures performed   Clinical Data: No additional findings.   Subjective: Chief Complaint  Patient presents with  . Right Hip - Pain  . Right Knee - Pain  The patient is someone I am seeing for the first time.  He sustained a mechanical fall on Christmas Eve last year and suffered an intertrochanteric hip fracture on the right side.  This was fixed in [place] with a rod and hip screw.  He has had problems since then with right hip pain as well as right knee pain.  He has a  slow time mobilizing.  He is 86 years old.  He has multiple medical issues and is on blood thinning medication.  Apparently he has had steroid injections in his right knee to treat the osteoarthritis pain.  He says he can barely walk due to his hip pain in his knee pain.  HPI  Review of Systems He currently denies any headache or chest pain.  Denies any shortness of breath or fever and chills.  Objective: Vital Signs: There were no vitals taken for this visit.  Physical Exam He is alert and oriented x3 but hard of hearing.  He does follow commands.  He is mainly in a wheelchair. Ortho Exam His right hip does move smoothly and fluidly but has pain throughout its arc of motion.  There is pain to palpation of the trochanteric area.  His right knee shows varus malalignment with medial joint line tenderness and a mild effusion. Specialty Comments:  No specialty comments available.  Imaging: XR HIP UNILAT W OR W/O PELVIS 1V RIGHT  Result Date: 09/15/2019 X-rays of the right hip show a healing to healed intertrochanteric fracture status post surgical fixation with a short intramedullary rod and hip screw.  There is no  evidence of hardware failure.  The hip joint itself is intact with no significant arthritic findings.  XR Knee 1-2 Views Right  Result Date: 09/15/2019 2 views of the right knee show tricompartmental arthritic changes.    PMFS History: Patient Active Problem List   Diagnosis Date Noted  . Closed comminuted intertrochanteric fracture of proximal end of right femur ([name]) 09/15/2019  . Unilateral primary osteoarthritis, right knee 09/15/2019  . Prolonged Q-T interval on ECG 06/07/2019  . CKD stage 4 due to type 2 diabetes mellitus ([name]) 06/03/2019  . GERD without esophagitis 06/03/2019  . Type 2 diabetes mellitus with stage 4 chronic kidney disease, with long-term current use of insulin ([name]) 06/03/2019  . BPH without urinary obstruction 06/03/2019  . Demand ischemia ([name])  05/29/2019  . Hypotension 05/24/2019  . History of pacemaker 05/24/2019  . Weakness 05/24/2019  . Nonsustained ventricular tachycardia ([name]) 05/24/2019  . Acute on chronic systolic CHF (congestive heart failure) ([place]) 05/24/2019  . Pleural effusion 05/20/2019  . Persistent atrial fibrillation with rapid ventricular response ([place]) 05/20/2019  . Secondary hypercoagulable state ([place]) 03/25/2019  . Pressure injury of sacral region, stage 2 ([name]) 03/14/2019  . S/P right hip fracture 03/14/2019  . Irregular heart beat 03/14/2019  . Persistent atrial fibrillation ([name]) 03/14/2019  . Depression, major, single episode, mild ([name]) 05/02/2018  . Balance problem 12/24/2017  . Falls frequently 12/24/2017  . Aortic atherosclerosis ([name]) 01/01/2017  . Chronic kidney disease, stage 4 (severe) ([name]) 08/14/2016  . Exudative age-related macular degeneration of left eye ([name]) 08/14/2016  . Tachycardia-bradycardia syndrome s/p PPM 08/14/2016  . Localized osteoarthritis of left knee 08/14/2016  . History of hypertension but hypotensive 08/14/2016  . Hyperlipidemia associated with type 2 diabetes mellitus ([name]) 06/10/2012  . Hypertensive kidney disease, benign(403.1) 06/10/2012  . Personal history of colonic adenomas 09/29/2004   Past Medical History:  Diagnosis Date  . Chronic kidney disease (CKD), stage III (moderate)   . Diabetes mellitus   . Diverticulosis   . Hard of hearing   . Hemorrhoids   . Hx of adenomatous colonic polyps 2006  . Hypertension   . Persistent atrial fibrillation ([name]) 03/2019  . Pleural effusion 05/20/2019  . Presence of permanent cardiac pacemaker 05/21/2019  . 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 Cancer Mother        ?  Marland Kitchen Bone cancer Sister   . Diabetes Brother   . Kidney disease Sister   . Colon cancer Neg Hx     Past Surgical History:  Procedure Laterality Date  . AV NODE ABLATION N/A 05/28/2019    Procedure: AV NODE ABLATION;  Surgeon: Thompson Grayer, MD;  Location: [place] [facility];  Service: Cardiovascular;  Laterality: N/A;  . CARDIOVERSION N/A 04/07/2019   Procedure: CARDIOVERSION;  Surgeon: Josue Hector, MD;  Location: [facility] ENDOSCOPY;  Service: Cardiovascular;  Laterality: N/A;  . CARDIOVERSION N/A 05/13/2019   Procedure: CARDIOVERSION;  Surgeon: Skeet Latch, MD;  Location: [place];  Service: Cardiovascular;  Laterality: N/A;  . CHOLECYSTECTOMY N/A 04/17/2014   Procedure: LAPAROSCOPIC CHOLECYSTECTOMY WITH INTRAOPERATIVE CHOLANGIOGRAM;  Surgeon: Erroll Luna, MD;  Location: [place];  Service: General;  Laterality: N/A;  . COLONOSCOPY  11/02/2007  . INSERT / REPLACE / REMOVE PACEMAKER    . MAXILLECTOMY Right 2003   SCCA alveolar ridge  . PACEMAKER IMPLANT N/A 05/21/2019   Procedure: PACEMAKER IMPLANT;  Surgeon: Constance Haw,  MD;  Location: [place] [facility];  Service: Cardiovascular;  Laterality: N/A;   Social History   Occupational History  . Occupation: Retired  Tobacco Use  . Smoking status: Former Smoker    Types: Cigars    Quit date: 04/07/2000    Years since quitting: 19.4  . Smokeless tobacco: Never Used  Vaping Use  . Vaping Use: Never used  Substance and Sexual Activity  . Alcohol use: Not Currently    Comment: socially  . Drug use: No  . Sexual activity: Not Currently

## 2019-09-16 ENCOUNTER — Telehealth: Payer: Self-pay

## 2019-09-16 NOTE — Telephone Encounter (Signed)
Right knee gel injection  

## 2019-09-17 NOTE — Telephone Encounter (Signed)
Noted  

## 2019-09-22 ENCOUNTER — Telehealth: Payer: Self-pay

## 2019-09-22 NOTE — Telephone Encounter (Signed)
-----   Message from Gayland Curry, DO sent at 09/05/2019  4:04 PM EDT ----- Regarding: lab order and appt [name] will need a TSH recheck in 6 weeks here at [facility].  Cardiology adjusted his synthroid.  Can we place the order and make the appt?  Thanks! ----- Message ----- From: Thompson Grayer, RN Sent: 09/05/2019  12:05 PM EDT To: Gayland Curry, DO

## 2019-09-22 NOTE — Telephone Encounter (Signed)
Wesley Garcia stated that she lives in [place] and it takes a lot for her to come and take Wesley Garcia to his appointments. She stated that he  has and appointment with Truitt Merle who will be getting a TSH done as well and said she would get them to send [place] a copy.   Note from chart:     3 month f/u will be accompany daughter in law/dg--will need TSH checked at this visit/pla.

## 2019-09-22 NOTE — Telephone Encounter (Signed)
Submitted VOB, Monovisc, right knee. 

## 2019-09-30 NOTE — Progress Notes (Addendum)
CARDIOLOGY OFFICE NOTE  Date:  10/14/2019    Wesley Garcia Date of Birth: 03/20/1929 Medical Record #[id number]  PCP:  Gayland Curry, DO  Cardiologist:  Servando Snare & [facility]    Chief Complaint  Patient presents with  . Follow-up    History of Present Illness: Wesley Garcia is a 86 y.o. male who presents today for a follow up visit. Seen for Dr. Irish Lack and Dr. Curt Bears.   He has a history of CKD IV, DM, HTN, squamous cell carcinoma of hard palate, fall with hip fracture s/p surgical repair 02/27/19, andpersistent atrial fibrillation. He is on Xarelto for anticoagulation. He has seen EP as well. He was cardioverted in February - short lived results. Started on amiodarone. He has required PPM implant back in mid March due to possible tachybrady and subsequent AV nodal ablation later in March. Amiodarone has been stopped.   I last saw him in May - he was doing ok and getting stronger from the prior hospitalizations/procedures. Had tolerated the SNF stay at all - very bad/sad situation but improved at home with home health. His wife is 55 and takes care of him. I changed his Lasix to just prn. On very little medicine and not on ideal therapy. Saw EP in early July - felt to be doing ok - had had another fall and bruised up his face however.    Comes in today. Here with his daughter in law. She augments the entire history. He is very hard of hearing - took his hearing aids out right before he came in here. He is in a wheelchair but is getting up a little more at home. Not able to get his weight today. Scale did not read. Doing ok overall - family is happy with how he is doing. Still not a lot of activity - but he does not want to. He can dress himself, feed himself and now can toilet himself - this is a great accomplishment. Wife is doing all the day to day activities. Other family doing the doctor visits. She will soon be 90. Fortunately her health is pretty good. Safety is goal.  No more falls. Remains on lower dose of Xarelto. He weighed in the 140's prior to getting sick earlier this year. Slowly gaining some back. Has not used any Lasix/potassium. Says he is not dizzy.   Past Medical History:  Diagnosis Date  . Chronic kidney disease (CKD), stage III (moderate)   . Diabetes mellitus   . Diverticulosis   . Hard of hearing   . Hemorrhoids   . Hx of adenomatous colonic polyps 2006  . Hypertension   . Persistent atrial fibrillation ([place]) 03/2019  . Pleural effusion 05/20/2019  . Presence of permanent cardiac pacemaker 05/21/2019  . Squamous cell carcinoma of hard palate ([place]) 2003    Past Surgical History:  Procedure Laterality Date  . AV NODE ABLATION N/A 05/28/2019   Procedure: AV NODE ABLATION;  Surgeon: Thompson Grayer, MD;  Location: [facility];  Service: Cardiovascular;  Laterality: N/A;  . CARDIOVERSION N/A 04/07/2019   Procedure: CARDIOVERSION;  Surgeon: Josue Hector, MD;  Location: [facility] ENDOSCOPY;  Service: Cardiovascular;  Laterality: N/A;  . CARDIOVERSION N/A 05/13/2019   Procedure: CARDIOVERSION;  Surgeon: Skeet Latch, MD;  Location: [facility];  Service: Cardiovascular;  Laterality: N/A;  . CHOLECYSTECTOMY N/A 04/17/2014   Procedure: LAPAROSCOPIC CHOLECYSTECTOMY WITH INTRAOPERATIVE CHOLANGIOGRAM;  Surgeon: Erroll Luna, MD;  Location: [facility];  Service: General;  Laterality: N/A;  . COLONOSCOPY  11/02/2007  . INSERT / REPLACE / REMOVE PACEMAKER    . MAXILLECTOMY Right 2003   SCCA alveolar ridge  . PACEMAKER IMPLANT N/A 05/21/2019   Procedure: PACEMAKER IMPLANT;  Surgeon: Constance Haw, MD;  Location: [facility];  Service: Cardiovascular;  Laterality: N/A;     Medications: Current Meds  Medication Sig  . acetaminophen (TYLENOL) 500 MG tablet Take 1,000 mg by mouth every 8 (eight) hours as needed for mild pain or headache.   . feeding supplement, ENSURE ENLIVE, (ENSURE ENLIVE) LIQD Take 237 mLs by mouth in the morning, at  noon, and at bedtime. Due to very poor oral meal intake.  He is high risk for dehydration and malnutrition  . furosemide (LASIX) 40 MG tablet TAKE 1 TABLET BY MOUTH DAILY AS NEEDED (WEIGHT GAIN OF 2 OR MORE POUNDS OVERNIGHT/SWELLING.).  Marland Kitchen insulin glargine, 2 Unit Dial, (TOUJEO MAX SOLOSTAR) 300 UNIT/ML Solostar Pen Inject 15 Units into the skin daily before breakfast. E11.22  . levothyroxine (SYNTHROID) 50 MCG tablet Take 1 tablet (50 mcg total) by mouth daily before breakfast.  . magnesium hydroxide (MILK OF MAGNESIA) 400 MG/5ML suspension Take 30 mLs by mouth daily as needed for mild constipation.  . metoprolol succinate (TOPROL-XL) 25 MG 24 hr tablet Take 1 tablet (25 mg total) by mouth daily.  . pantoprazole (PROTONIX) 40 MG tablet TAKE 1 TABLET (40 MG TOTAL) BY MOUTH 2 (TWO) TIMES DAILY BEFORE A MEAL.  Marland Kitchen potassium chloride SA (KLOR-CON M20) 20 MEQ tablet Take 1 tablet (20 mEq total) by mouth daily as needed (take only if takes Lasix).  . tamsulosin (FLOMAX) 0.4 MG CAPS capsule TAKE 1 CAPSULE BY MOUTH EVERYDAY AT BEDTIME  . XARELTO 15 MG TABS tablet TAKE 1 TABLET (15 MG TOTAL) BY MOUTH DAILY WITH SUPPER.     Allergies: Allergies  Allergen Reactions  . Tape Other (See Comments)    SKIN IS THIN AND IT TEARS AND BRUISES VERY, VERY EASILY!! PLEASE USE AN ALTERNATIVE.    Social History: The patient  reports that he quit smoking about 19 years ago. His smoking use included cigars. He has never used smokeless tobacco. He reports previous alcohol use. He reports that he does not use drugs.   Family History: The patient's family history includes Bone cancer in his sister; Cancer in his mother; Diabetes in his brother; Kidney disease in his sister.   Review of Systems: Please see the history of present illness.   All other systems are reviewed and negative.   Physical Exam: VS:  BP (!) 100/40   Pulse 72   Ht 5\' 6"  (1.676 m)   SpO2 97%   BMI 21.47 kg/m  .  BMI Body mass index is 21.47  kg/m.  Wt Readings from Last 3 Encounters:  09/04/19 133 lb (60.3 kg)  07/17/19 130 lb (59 kg)  07/09/19 124 lb (56.2 kg)   He was 134 lbs today.    General: Pleasant. Alert. He is very hard of hearing. He is in no acute distress.  He is in a wheelchair. Seems frail.  Cardiac: Fairly regular. Heart tones are distant. No edema.  Respiratory:  Decreased breath sounds but with normal work of breathing.  GI: Soft and nontender.  MS: No deformity or atrophy. Gait not tested.  Skin: Warm and dry. Color is normal.  Neuro:  Strength and sensation are intact and no gross focal deficits noted.  Psych: Alert, appropriate and with  normal affect.   LABORATORY DATA:  EKG:  EKG is not ordered today.    Lab Results  Component Value Date   WBC 8.5 07/09/2019   HGB 11.5 (L) 07/09/2019   HCT 35.0 (L) 07/09/2019   PLT 261 07/09/2019   GLUCOSE 201 (H) 07/09/2019   CHOL 86 10/01/2018   TRIG 49 10/01/2018   HDL 47 10/01/2018   LDLCALC 25 10/01/2018   ALT 14 05/20/2019   AST 23 05/20/2019   NA 136 07/09/2019   K 4.9 07/09/2019   CL 96 07/09/2019   CREATININE 1.95 (H) 07/09/2019   BUN 37 (H) 07/09/2019   CO2 27 07/09/2019   TSH 11.200 (H) 09/04/2019   HGBA1C 7.3 (H) 07/17/2019   MICROALBUR 1.4 05/02/2018     BNP (last 3 results) Recent Labs    05/20/19 1955  BNP 1,671.4*    ProBNP (last 3 results) No results for input(s): PROBNP in the last 8760 hours.   Other Studies Reviewed Today:  TTE 04/21/2019 Review of the above records today demonstrates:  1. Septal apical distal anterior wall mid and apical inferior wall  hypokinesis Basal function preserved Abnormal GLS -6. Left ventricular  ejection fraction, by estimation, is 25%. The left ventricle has severely  decreased function. The left ventricle  demonstrates regional wall motion abnormalities (see scoring  diagram/findings for description). The left ventricular internal cavity  size was moderately to severely dilated.  Left ventricular diastolic  parameters are indeterminate.  2. Right ventricular systolic function is normal. The right ventricular  size is normal. There is moderately elevated pulmonary artery systolic  pressure.  3. Left atrial size was mildly dilated.  4. The mitral valve is normal in structure and function. Mild mitral  valve regurgitation. No evidence of mitral stenosis.  5. The aortic valve is tricuspid. Aortic valve regurgitation is trivial.  Mild aortic valve sclerosis is present, with no evidence of aortic valve  stenosis.  6. The inferior vena cava is dilated in size with >50% respiratory  variability, suggesting right atrial pressure of 8 mmHg.    ASSESSMENT AND PLAN:  1. Chronic systolic HF - unclear etiology - may have been tachycardia mediated - not on guideline therapy due to soft BP. He is a DNR. Only on low does Toprol. We have not felt that an ischemic evaluation is appropriate. Family has opted for conservative therapy.   2. Tachybrady syndrome - has PPM in place - saw EP last month.   3. Chronic anticoagulation - no problems noted. Rechecking lab today.   4. Hypothyroid - rechecking TSh today.   5. Persistent AF - has had AV nodal ablation - has underlying PPM in place - on anticoagulation   6. Generalized deconditioning - some progress but he is probably at his new baseline. Safety is their goal.   Current medicines are reviewed with the patient today.  The patient does not have concerns regarding medicines other than what has been noted above.  The following changes have been made:  See above.  Labs/ tests ordered today include:    Orders Placed This Encounter  Procedures  . Basic metabolic panel  . CBC  . TSH     Disposition:   FU with [place] in 4 months.   Patient is agreeable to this plan and will call if any problems develop in the interim.   SignedTruitt Merle, NP  10/14/2019 11:29 AM  [address]  [place], [place]  [address] Phone: [phone] Fax: [phone]

## 2019-10-01 ENCOUNTER — Telehealth: Payer: Self-pay

## 2019-10-01 NOTE — Telephone Encounter (Signed)
Called and left VM to have patient to return call to schedule gel injection with Dr. Ninfa Linden.  Approved, Monovisc, right knee. [employer] Wesley Garcia will be responsible for 20% OOP. $35.00 Co-pay PA required PA Approval# [id number]  Valid 10/01/2019- 04/02/2020

## 2019-10-03 ENCOUNTER — Other Ambulatory Visit: Payer: Self-pay | Admitting: Adult Health

## 2019-10-03 NOTE — Telephone Encounter (Signed)
Patient is requesting "Xarelto" to be refilled. Patient had medication last filled by Dr.Alexander on 06/13/2019 with 30 tablets and no refills. Is this patient suppose to continue this medication long term? Please Advise. Thank you, Wesley Garcia.D/RMA

## 2019-10-03 NOTE — Telephone Encounter (Signed)
Please call his daughter-in-law and get clarification and check cardiology notes.  He may have gotten some samples because he has a lot of difficulty affording meds.  As long as the xarelto is on the med list and his daughter in law can confirm he's taking it, we can refill it.

## 2019-10-05 ENCOUNTER — Other Ambulatory Visit: Payer: Self-pay | Admitting: Adult Health

## 2019-10-05 ENCOUNTER — Other Ambulatory Visit: Payer: Self-pay | Admitting: Internal Medicine

## 2019-10-05 DIAGNOSIS — N410 Acute prostatitis: Secondary | ICD-10-CM

## 2019-10-06 ENCOUNTER — Other Ambulatory Visit: Payer: Self-pay | Admitting: Internal Medicine

## 2019-10-06 LAB — CUP PACEART INCLINIC DEVICE CHECK
Brady Statistic RA Percent Paced: 76 %
Brady Statistic RV Percent Paced: 99 %
Date Time Interrogation Session: 20210406111111
Implantable Lead Implant Date: 20210317
Implantable Lead Implant Date: 20210317
Implantable Lead Location: 753859
Implantable Lead Location: 753860
Implantable Pulse Generator Implant Date: 20210317
Lead Channel Pacing Threshold Amplitude: 1.75 V
Lead Channel Pacing Threshold Pulse Width: 1 ms
Lead Channel Sensing Intrinsic Amplitude: 3.6 mV
Lead Channel Sensing Intrinsic Amplitude: 3.6 mV
Pulse Gen Model: 2272
Pulse Gen Serial Number: 3803323

## 2019-10-06 NOTE — Telephone Encounter (Signed)
Spoke to patient daughter in law "Wesley Garcia" she confirmed that patient is taking this medication daily. Patient cardiologist noted that patient is taking "Xarelto" but never noted anything about patient having samples. Lastly mediation is on patient medication list. Patient medication "Xarelto" refilled and sent to pharmacy with 30 pills and 2 refills.

## 2019-10-11 ENCOUNTER — Other Ambulatory Visit: Payer: Self-pay | Admitting: Nurse Practitioner

## 2019-10-14 ENCOUNTER — Ambulatory Visit: Payer: Medicare PPO | Admitting: Nurse Practitioner

## 2019-10-14 ENCOUNTER — Other Ambulatory Visit: Payer: Self-pay

## 2019-10-14 ENCOUNTER — Encounter: Payer: Self-pay | Admitting: Nurse Practitioner

## 2019-10-14 VITALS — BP 100/40 | HR 72 | Ht 66.0 in | Wt 134.0 lb

## 2019-10-14 DIAGNOSIS — N184 Chronic kidney disease, stage 4 (severe): Secondary | ICD-10-CM

## 2019-10-14 DIAGNOSIS — I495 Sick sinus syndrome: Secondary | ICD-10-CM | POA: Diagnosis not present

## 2019-10-14 DIAGNOSIS — I4819 Other persistent atrial fibrillation: Secondary | ICD-10-CM

## 2019-10-14 DIAGNOSIS — R7989 Other specified abnormal findings of blood chemistry: Secondary | ICD-10-CM

## 2019-10-14 DIAGNOSIS — Z950 Presence of cardiac pacemaker: Secondary | ICD-10-CM | POA: Diagnosis not present

## 2019-10-14 DIAGNOSIS — Z7901 Long term (current) use of anticoagulants: Secondary | ICD-10-CM

## 2019-10-14 LAB — CBC
Hematocrit: 35.9 % — ABNORMAL LOW (ref 37.5–51.0)
Hemoglobin: 11.9 g/dL — ABNORMAL LOW (ref 13.0–17.7)
MCH: 31 pg (ref 26.6–33.0)
MCHC: 33.1 g/dL (ref 31.5–35.7)
MCV: 94 fL (ref 79–97)
Platelets: 229 10*3/uL (ref 150–450)
RBC: 3.84 x10E6/uL — ABNORMAL LOW (ref 4.14–5.80)
RDW: 12.5 % (ref 11.6–15.4)
WBC: 8.5 10*3/uL (ref 3.4–10.8)

## 2019-10-14 LAB — BASIC METABOLIC PANEL
BUN/Creatinine Ratio: 12 (ref 10–24)
BUN: 21 mg/dL (ref 8–27)
CO2: 22 mmol/L (ref 20–29)
Calcium: 9.1 mg/dL (ref 8.6–10.2)
Chloride: 105 mmol/L (ref 96–106)
Creatinine, Ser: 1.74 mg/dL — ABNORMAL HIGH (ref 0.76–1.27)
GFR calc Af Amer: 40 mL/min/{1.73_m2} — ABNORMAL LOW (ref >59)
GFR calc non Af Amer: 34 mL/min/{1.73_m2} — ABNORMAL LOW (ref >59)
Glucose: 147 mg/dL — ABNORMAL HIGH (ref 65–99)
Potassium: 4.1 mmol/L (ref 3.5–5.2)
Sodium: 140 mmol/L (ref 134–144)

## 2019-10-14 LAB — TSH: TSH: 4.48 u[IU]/mL (ref 0.450–4.500)

## 2019-10-14 NOTE — Patient Instructions (Addendum)
After Visit Summary:  We will be checking the following labs today - BMET, CBC and TSH   Medication Instructions:    Continue with your current medicines.    If you need a refill on your cardiac medications before your next appointment, please call your pharmacy.     Testing/Procedures To Be Arranged:  N/A  Follow-Up:   See me in about 4 months.     At [facility], you and your health needs are our priority.  As part of our continuing mission to provide you with exceptional heart care, we have created designated Provider Care Teams.  These Care Teams include your primary Cardiologist (physician) and Advanced Practice Providers (APPs -  Physician Assistants and Nurse Practitioners) who all work together to provide you with the care you need, when you need it.  Special Instructions:  . Stay safe, wash your hands for at least 20 seconds and wear a mask when needed.  . It was good to talk with you today.    Call the [place] office at [phone] if you have any questions, problems or concerns.

## 2019-10-15 ENCOUNTER — Encounter: Payer: Self-pay | Admitting: Orthopaedic Surgery

## 2019-10-15 ENCOUNTER — Ambulatory Visit: Payer: Medicare PPO | Admitting: Physician Assistant

## 2019-10-15 DIAGNOSIS — M1711 Unilateral primary osteoarthritis, right knee: Secondary | ICD-10-CM | POA: Diagnosis not present

## 2019-10-15 MED ORDER — HYALURONAN 88 MG/4ML IX SOSY
88.0000 mg | PREFILLED_SYRINGE | INTRA_ARTICULAR | Status: AC | PRN
  Administered 2019-10-15: 88 mg via INTRA_ARTICULAR

## 2019-10-15 NOTE — Progress Notes (Signed)
   Procedure Note  Patient: Wesley Garcia             Date of Birth: Aug 03, 1929           MRN: [id number]             Visit Date: 10/15/2019 HPI: [name] comes in today for Monovisc injections right knee.  He has known arthritis of the right knee.  He has had no new injuries.  Physical exam: Right knee no abnormal warmth erythema or effusion.  Good range of motion of the knee.  Procedures: Visit Diagnoses:  1. Unilateral primary osteoarthritis, right knee     Large Joint Inj: R knee on 10/15/2019 10:44 AM Indications: pain Details: 22 G 1.5 in needle, anterolateral approach  Arthrogram: No  Medications: 88 mg Hyaluronan 88 MG/4ML Outcome: tolerated well, no immediate complications Procedure, treatment alternatives, risks and benefits explained, specific risks discussed. Consent was given by the patient. Immediately prior to procedure a time out was called to verify the correct patient, procedure, equipment, support staff and site/side marked as required. Patient was prepped and draped in the usual sterile fashion.     Plan: Explained to him today that it may take 6 to 8 weeks for the injection to begin working.  He will follow-up with [place] in 8 weeks to see what type of response he had to the Monovisc injection.  He tolerated the injection well.  Questions encouraged and answered.

## 2019-11-07 ENCOUNTER — Other Ambulatory Visit: Payer: Self-pay | Admitting: Internal Medicine

## 2019-11-07 DIAGNOSIS — N410 Acute prostatitis: Secondary | ICD-10-CM

## 2019-11-19 LAB — CUP PACEART REMOTE DEVICE CHECK
Battery Remaining Longevity: 97 mo
Battery Remaining Percentage: 95.5 %
Battery Voltage: 3.01 V
Brady Statistic AP VP Percent: 30 %
Brady Statistic AP VS Percent: 1 %
Brady Statistic AS VP Percent: 70 %
Brady Statistic AS VS Percent: 1 %
Brady Statistic RA Percent Paced: 30 %
Brady Statistic RV Percent Paced: 99 %
Date Time Interrogation Session: 20210915020014
Implantable Lead Implant Date: 20210317
Implantable Lead Implant Date: 20210317
Implantable Lead Location: 753859
Implantable Lead Location: 753860
Implantable Pulse Generator Implant Date: 20210317
Lead Channel Impedance Value: 380 Ohm
Lead Channel Impedance Value: 450 Ohm
Lead Channel Pacing Threshold Amplitude: 0.75 V
Lead Channel Pacing Threshold Amplitude: 1 V
Lead Channel Pacing Threshold Pulse Width: 0.5 ms
Lead Channel Pacing Threshold Pulse Width: 0.8 ms
Lead Channel Sensing Intrinsic Amplitude: 1.8 mV
Lead Channel Sensing Intrinsic Amplitude: 12 mV
Lead Channel Setting Pacing Amplitude: 2 V
Lead Channel Setting Pacing Amplitude: 2.5 V
Lead Channel Setting Pacing Pulse Width: 0.5 ms
Lead Channel Setting Sensing Sensitivity: 4 mV
Pulse Gen Model: 2272
Pulse Gen Serial Number: 3803323

## 2019-11-20 ENCOUNTER — Ambulatory Visit (INDEPENDENT_AMBULATORY_CARE_PROVIDER_SITE_OTHER): Payer: Medicare PPO | Admitting: *Deleted

## 2019-11-20 DIAGNOSIS — I495 Sick sinus syndrome: Secondary | ICD-10-CM | POA: Diagnosis not present

## 2019-11-24 NOTE — Progress Notes (Signed)
Remote pacemaker transmission.   

## 2019-11-28 ENCOUNTER — Other Ambulatory Visit: Payer: Self-pay | Admitting: Internal Medicine

## 2019-11-28 DIAGNOSIS — R809 Proteinuria, unspecified: Secondary | ICD-10-CM

## 2019-11-28 DIAGNOSIS — N184 Chronic kidney disease, stage 4 (severe): Secondary | ICD-10-CM

## 2019-11-28 DIAGNOSIS — E1129 Type 2 diabetes mellitus with other diabetic kidney complication: Secondary | ICD-10-CM

## 2019-11-28 DIAGNOSIS — I4819 Other persistent atrial fibrillation: Secondary | ICD-10-CM

## 2019-11-28 DIAGNOSIS — E039 Hypothyroidism, unspecified: Secondary | ICD-10-CM

## 2019-11-28 DIAGNOSIS — R531 Weakness: Secondary | ICD-10-CM

## 2019-11-28 NOTE — Progress Notes (Signed)
Orders placed for rescheduled appt

## 2019-12-04 ENCOUNTER — Other Ambulatory Visit: Payer: Medicare PPO

## 2019-12-04 ENCOUNTER — Other Ambulatory Visit: Payer: Medicare PPO | Admitting: Internal Medicine

## 2019-12-08 ENCOUNTER — Other Ambulatory Visit: Payer: Self-pay

## 2019-12-08 ENCOUNTER — Encounter: Payer: Self-pay | Admitting: Internal Medicine

## 2019-12-08 ENCOUNTER — Ambulatory Visit (INDEPENDENT_AMBULATORY_CARE_PROVIDER_SITE_OTHER): Payer: Medicare PPO | Admitting: Internal Medicine

## 2019-12-08 VITALS — BP 118/58 | HR 78 | Temp 97.3°F | Wt 131.4 lb

## 2019-12-08 DIAGNOSIS — R531 Weakness: Secondary | ICD-10-CM | POA: Diagnosis not present

## 2019-12-08 DIAGNOSIS — E1122 Type 2 diabetes mellitus with diabetic chronic kidney disease: Secondary | ICD-10-CM | POA: Diagnosis not present

## 2019-12-08 DIAGNOSIS — I4819 Other persistent atrial fibrillation: Secondary | ICD-10-CM

## 2019-12-08 DIAGNOSIS — I700 Atherosclerosis of aorta: Secondary | ICD-10-CM

## 2019-12-08 DIAGNOSIS — E039 Hypothyroidism, unspecified: Secondary | ICD-10-CM

## 2019-12-08 DIAGNOSIS — Z23 Encounter for immunization: Secondary | ICD-10-CM

## 2019-12-08 DIAGNOSIS — M25551 Pain in right hip: Secondary | ICD-10-CM

## 2019-12-08 DIAGNOSIS — E1129 Type 2 diabetes mellitus with other diabetic kidney complication: Secondary | ICD-10-CM

## 2019-12-08 DIAGNOSIS — N184 Chronic kidney disease, stage 4 (severe): Secondary | ICD-10-CM

## 2019-12-08 DIAGNOSIS — Z794 Long term (current) use of insulin: Secondary | ICD-10-CM

## 2019-12-08 DIAGNOSIS — R809 Proteinuria, unspecified: Secondary | ICD-10-CM

## 2019-12-08 DIAGNOSIS — H353220 Exudative age-related macular degeneration, left eye, stage unspecified: Secondary | ICD-10-CM

## 2019-12-08 DIAGNOSIS — N401 Enlarged prostate with lower urinary tract symptoms: Secondary | ICD-10-CM

## 2019-12-08 DIAGNOSIS — N138 Other obstructive and reflux uropathy: Secondary | ICD-10-CM

## 2019-12-08 NOTE — Progress Notes (Signed)
Location:  [facility] clinic Provider:  Lyrical Sowle L. Mariea Clonts, D.O., C.M.D.  Code Status: DNR Goals of Care:  Advanced Directives 12/08/2019  Does Patient Have a Medical Advance Directive? Yes  Type of Advance Directive Out of facility DNR (pink MOST or yellow form)  Does patient want to make changes to medical advance directive? No - Patient declined  Copy of [place] in Chart? -  Would patient like information on creating a medical advance directive? -  Pre-existing out of facility DNR order (yellow form or pink MOST form) -   Chief Complaint  Patient presents with  . Medical Management of Chronic Issues    4 month follow   . Health Maintenance    Influenza    HPI: Patient is a 86 y.o. male seen today for medical management of chronic diseases.    He's doing ok.  His bladder control remains poor.  He' sok in the daytime, but up 4-5 times at night.  He's taking the myrbetriq and flomax both in evening.  Suggested moving myrbetriq to the am.    He's requesting a rollator walker with 4 wheels and a seat.  He feels ready for that.  He's had the other one but it was his DIL's.  Has not fallen in months.  He had a face plant on the tile floor last in May or even before that.  He's much steadier.  He wants to get back to walking in the mall.  His wife misses it more than he does.  Has to use the other walker at home due to the levels of the house.    DMII:  AM CBGs in 80s.  He's not checking other times.   It was 114 when his DIL was with him.  Another time he recalls was 125.  He does cheat some with apple pie.   Taking toujeo 15 units.  He's not having the wobbly spells anymore.  No longer dizzy except after BMs when stands--has to wait.  Has not needed to take any lasix.  He is down 3 lbs since August.  Sometimes has protein shakes but not daily.    Hip continues to bother him.  Knee Is pretty good.  He got a shot in it at Dr. Ninfa Linden.  He also checked the healing of the hip  which was healing well.  Fx had been 02/26/19.    He's wearing brand new hearing aids that apparently need adjustment yet.    Past Medical History:  Diagnosis Date  . Chronic kidney disease (CKD), stage III (moderate) (HCC)   . Diabetes mellitus   . Diverticulosis   . Hard of hearing   . Hemorrhoids   . Hx of adenomatous colonic polyps 2006  . Hypertension   . Persistent atrial fibrillation ([place]) 03/2019  . Pleural effusion 05/20/2019  . Presence of permanent cardiac pacemaker 05/21/2019  . 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 AV NODE ABLATION N/A 05/28/2019   Procedure: AV NODE ABLATION;  Surgeon: Thompson Grayer, MD;  Location: [place] [facility];  Service: Cardiovascular;  Laterality: N/A;  . CARDIOVERSION N/A 04/07/2019   Procedure: CARDIOVERSION;  Surgeon: Josue Hector, MD;  Location: [facility] ENDOSCOPY;  Service: Cardiovascular;  Laterality: N/A;  . CARDIOVERSION N/A 05/13/2019   Procedure: CARDIOVERSION;  Surgeon: Skeet Latch, MD;  Location: [place];  Service: Cardiovascular;  Laterality: N/A;  . CHOLECYSTECTOMY N/A  04/17/2014   Procedure: LAPAROSCOPIC CHOLECYSTECTOMY WITH INTRAOPERATIVE CHOLANGIOGRAM;  Surgeon: Erroll Luna, MD;  Location: [place];  Service: General;  Laterality: N/A;  . COLONOSCOPY  11/02/2007  . INSERT / REPLACE / REMOVE PACEMAKER    . MAXILLECTOMY Right 2003   SCCA alveolar ridge  . PACEMAKER IMPLANT N/A 05/21/2019   Procedure: PACEMAKER IMPLANT;  Surgeon: Constance Haw, MD;  Location: [place] [facility];  Service: Cardiovascular;  Laterality: N/A;    Allergies  Allergen Reactions  . Tape Other (See Comments)    SKIN IS THIN AND IT TEARS AND BRUISES VERY, VERY EASILY!! PLEASE USE AN ALTERNATIVE.    Outpatient Encounter Medications as of 12/08/2019  Medication Sig  . acetaminophen (TYLENOL) 500 MG tablet Take 1,000 mg by mouth every 8 (eight) hours as needed for mild pain or  headache.   . feeding supplement, ENSURE ENLIVE, (ENSURE ENLIVE) LIQD Take 237 mLs by mouth in the morning, at noon, and at bedtime. Due to very poor oral meal intake.  He is high risk for dehydration and malnutrition  . furosemide (LASIX) 40 MG tablet TAKE 1 TABLET BY MOUTH DAILY AS NEEDED (WEIGHT GAIN OF 2 OR MORE POUNDS OVERNIGHT/SWELLING.).  Marland Kitchen insulin glargine, 2 Unit Dial, (TOUJEO MAX SOLOSTAR) 300 UNIT/ML Solostar Pen Inject 15 Units into the skin daily before breakfast. E11.22  . levothyroxine (SYNTHROID) 50 MCG tablet Take 1 tablet (50 mcg total) by mouth daily before breakfast.  . magnesium hydroxide (MILK OF MAGNESIA) 400 MG/5ML suspension Take 30 mLs by mouth daily as needed for mild constipation.  . pantoprazole (PROTONIX) 40 MG tablet TAKE 1 TABLET (40 MG TOTAL) BY MOUTH 2 (TWO) TIMES DAILY BEFORE A MEAL.  Marland Kitchen potassium chloride SA (KLOR-CON M20) 20 MEQ tablet Take 1 tablet (20 mEq total) by mouth daily as needed (take only if takes Lasix).  . tamsulosin (FLOMAX) 0.4 MG CAPS capsule TAKE 1 CAPSULE BY MOUTH EVERYDAY AT BEDTIME  . XARELTO 15 MG TABS tablet TAKE 1 TABLET (15 MG TOTAL) BY MOUTH DAILY WITH SUPPER.  . [DISCONTINUED] metoprolol succinate (TOPROL-XL) 25 MG 24 hr tablet Take 1 tablet (25 mg total) by mouth daily.   No facility-administered encounter medications on file as of 12/08/2019.    Review of Systems:  Review of Systems  Constitutional: Positive for weight loss. Negative for chills, fever and malaise/fatigue.  HENT: Positive for hearing loss. Negative for congestion and sore throat.   Eyes: Negative for blurred vision.       Glasses  Respiratory: Negative for cough and shortness of breath.   Cardiovascular: Negative for chest pain and leg swelling.  Gastrointestinal: Negative for abdominal pain, blood in stool, constipation and melena.  Genitourinary: Positive for frequency. Negative for dysuria.  Musculoskeletal: Positive for joint pain. Negative for falls.        Right hip  Skin: Negative for itching and rash.  Neurological: Positive for tingling, sensory change and weakness. Negative for dizziness and loss of consciousness.  Endo/Heme/Allergies: Bruises/bleeds easily.  Psychiatric/Behavioral: Positive for memory loss. Negative for depression. The patient is not nervous/anxious and does not have insomnia.     Health Maintenance  Topic Date Due  . URINE MICROALBUMIN  05/03/2019  . HEMOGLOBIN A1C  01/17/2020  . FOOT EXAM  01/23/2020  . OPHTHALMOLOGY EXAM  02/10/2020  . TETANUS/TDAP  04/16/2021  . INFLUENZA VACCINE  Completed  . COVID-19 Vaccine  Completed  . PNA vac Low Risk Adult  Completed    Physical Exam: Vitals:  12/08/19 0923  BP: (!) 118/58  Pulse: 78  Temp: (!) 97.3 F (36.3 C)  TempSrc: Temporal  SpO2: 96%  Weight: 131 lb 6.4 oz (59.6 kg)   Body mass index is 21.21 kg/m. Physical Exam Vitals reviewed.  Constitutional:      General: He is not in acute distress.    Appearance: Normal appearance. He is not toxic-appearing.     Comments: Frail male  HENT:     Head: Normocephalic and atraumatic.     Right Ear: External ear normal.     Left Ear: External ear normal.     Ears:     Comments: HOH, has new hearing aids but has nerve damage so not hearing well yet anyway Eyes:     Extraocular Movements: Extraocular movements intact.     Pupils: Pupils are equal, round, and reactive to light.  Cardiovascular:     Rate and Rhythm: Normal rate and regular rhythm.     Pulses: Normal pulses.     Heart sounds: Normal heart sounds.  Pulmonary:     Effort: Pulmonary effort is normal.     Breath sounds: Normal breath sounds. No wheezing, rhonchi or rales.  Abdominal:     General: Bowel sounds are normal.  Musculoskeletal:        General: Normal range of motion.     Cervical back: Neck supple.     Right lower leg: No edema.     Left lower leg: No edema.  Skin:    General: Skin is warm and dry.     Comments: Has had multiple  skin cancers removed over scalp and behind ears and temples  Neurological:     General: No focal deficit present.     Mental Status: He is alert and oriented to person, place, and time.     Cranial Nerves: No cranial nerve deficit.     Motor: Weakness present.     Gait: Gait abnormal.     Comments: Short-term memory loss  Psychiatric:        Mood and Affect: Mood normal.     Labs reviewed: Basic Metabolic Panel: Recent Labs    04/10/19 1407 05/24/19 0232 05/24/19 0232 05/25/19 0600 05/25/19 1548 05/26/19 0413 05/27/19 0313 06/02/19 0819 07/09/19 1054 09/04/19 1425 10/14/19 1121  NA  --  137   < > 134*   < > 134*   < > 137 136  --  140  K  --  3.7   < > 3.9   < > 5.3*   < > 4.3 4.9  --  4.1  CL  --  99   < > 97*   < > 94*   < > 100 96  --  105  CO2  --  25   < > 25   < > 28   < > 29 27  --  22  GLUCOSE  --  78   < > 225*   < > 122*   < > 171* 201*  --  147*  BUN  --  27*   < > 31*   < > 33*   < > 39* 37*  --  21  CREATININE  --  2.16*   < > 2.26*   < > 2.37*   < > 2.10* 1.95*  --  1.74*  CALCIUM  --  8.5*   < > 8.2*   < > 8.5*   < > 8.5* 9.5  --  9.1  MG  --  1.9  --  1.8  --  2.1  --   --   --   --   --   TSH   < >  --   --   --   --   --   --   --  16.000* 11.200* 4.480   < > = values in this interval not displayed.   Liver Function Tests: Recent Labs    05/20/19 1955  AST 23  ALT 14  ALKPHOS 74  BILITOT 0.9  PROT 6.0*  ALBUMIN 2.8*   No results for input(s): LIPASE, AMYLASE in the last 8760 hours. No results for input(s): AMMONIA in the last 8760 hours. CBC: Recent Labs    03/10/19 1342 03/28/19 1137 04/10/19 1407 05/05/19 1500 05/29/19 0206 07/09/19 1054 10/14/19 1121  WBC 22.1*   < > 6.5   < > 8.9 8.5 8.5  NEUTROABS 19,360*  --  4,680  --   --   --   --   HGB 10.1*   < > 12.1*   < > 10.2* 11.5* 11.9*  HCT 30.7*   < > 37.9*   < > 33.1* 35.0* 35.9*  MCV 95.6   < > 92.7   < > 90.9 86 94  PLT 561*   < > 316   < > 223 261 229   < > = values in this  interval not displayed.   Lipid Panel: No results for input(s): CHOL, HDL, LDLCALC, TRIG, CHOLHDL, LDLDIRECT in the last 8760 hours. Lab Results  Component Value Date   HGBA1C 7.3 (H) 07/17/2019    Procedures since last visit: CUP [place]  Result Date: 11/19/2019 Scheduled remote reviewed. Normal device function.  There were four atrial arrhythmias detected that all lasted less than one minute.  Next remote 91 days. Kathy Breach, RN, CCDS, [employer]   Assessment/Plan 1. Controlled type 2 diabetes mellitus with microalbuminuria, with long-term current use of insulin ([place]) - f/u hba1c, cont toujeo 15 units daily before breakfast, no lows noted - Microalbumin / creatinine urine ratio - Hemoglobin A1c - COMPLETE METABOLIC PANEL WITH GFR  2. CKD stage 4 due to type 2 diabetes mellitus (HCC) -Avoid nephrotoxic agents like nsaids, dose adjust renally excreted meds, hydrate. - COMPLETE METABOLIC PANEL WITH GFR - CBC with Differential/Platelet -urine micro  3. Need for influenza vaccination - Flu Vaccine QUAD High Dose(Fluad)  4. Generalized weakness -improving, continue use of walker  5. Right hip pain - remains, cont tylenol for pain, requests new rollator walker--has not purchased one (had borrowed his rolling walker with skis) - For home use only DME Other see comment  6. Acquired hypothyroidism - cont levothyroxine therapy, f/u tsh - TSH  7. Persistent atrial fibrillation (HCC) S/p pacemaker, cont xarelto, doing much better since pacemaker--no volume control issues recently - COMPLETE METABOLIC PANEL WITH GFR - CBC with Differential/Platelet  8. Exudative age-related macular degeneration of left eye, unspecified stage ([place]) -no recent changes reported, cont glasses and regular ophtho f/u  9. Aortic atherosclerosis (HCC) -per imaging, managing bp, glucose, lipids  10. BPH with obstruction/lower urinary tract symptoms -cont flomax, move  myrbetriq to morning to see if that decreases nocturia though I doubt it  Labs/tests ordered:   labs released that were ordered as future  Next appt:  01/02/2020   Giovannie Scerbo L. Nastassja Witkop, D.O. [facility] [address]  [place], [place] [address] Cell Phone (Mon-Fri 8am-5pm):  [phone] On Call:  [phone] & follow prompts after 5pm & weekends Office Phone:  [phone] Office Fax:  [phone]

## 2019-12-08 NOTE — Patient Instructions (Signed)
Try moving myrbetriq to the morning to see if it alters bedtime urination.

## 2019-12-09 LAB — CBC WITH DIFFERENTIAL/PLATELET
Absolute Monocytes: 881 cells/uL (ref 200–950)
Basophils Absolute: 67 cells/uL (ref 0–200)
Basophils Relative: 0.9 %
Eosinophils Absolute: 52 cells/uL (ref 15–500)
Eosinophils Relative: 0.7 %
HCT: 36.8 % — ABNORMAL LOW (ref 38.5–50.0)
Hemoglobin: 12.1 g/dL — ABNORMAL LOW (ref 13.2–17.1)
Lymphs Abs: 1339 cells/uL (ref 850–3900)
MCH: 30.2 pg (ref 27.0–33.0)
MCHC: 32.9 g/dL (ref 32.0–36.0)
MCV: 91.8 fL (ref 80.0–100.0)
MPV: 10 fL (ref 7.5–12.5)
Monocytes Relative: 11.9 %
Neutro Abs: 5062 cells/uL (ref 1500–7800)
Neutrophils Relative %: 68.4 %
Platelets: 243 10*3/uL (ref 140–400)
RBC: 4.01 10*6/uL — ABNORMAL LOW (ref 4.20–5.80)
RDW: 13.2 % (ref 11.0–15.0)
Total Lymphocyte: 18.1 %
WBC: 7.4 10*3/uL (ref 3.8–10.8)

## 2019-12-09 LAB — COMPLETE METABOLIC PANEL WITH GFR
AG Ratio: 1.5 (calc) (ref 1.0–2.5)
ALT: 5 U/L — ABNORMAL LOW (ref 9–46)
AST: 12 U/L (ref 10–35)
Albumin: 3.8 g/dL (ref 3.6–5.1)
Alkaline phosphatase (APISO): 59 U/L (ref 35–144)
BUN/Creatinine Ratio: 10 (calc) (ref 6–22)
BUN: 21 mg/dL (ref 7–25)
CO2: 26 mmol/L (ref 20–32)
Calcium: 9.7 mg/dL (ref 8.6–10.3)
Chloride: 107 mmol/L (ref 98–110)
Creat: 2.04 mg/dL — ABNORMAL HIGH (ref 0.70–1.11)
GFR, Est African American: 33 mL/min/{1.73_m2} — ABNORMAL LOW (ref >=60)
GFR, Est Non African American: 28 mL/min/{1.73_m2} — ABNORMAL LOW (ref >=60)
Globulin: 2.6 g/dL (calc) (ref 1.9–3.7)
Glucose, Bld: 75 mg/dL (ref 65–99)
Potassium: 4.3 mmol/L (ref 3.5–5.3)
Sodium: 141 mmol/L (ref 135–146)
Total Bilirubin: 0.7 mg/dL (ref 0.2–1.2)
Total Protein: 6.4 g/dL (ref 6.1–8.1)

## 2019-12-09 LAB — HEMOGLOBIN A1C
Hgb A1c MFr Bld: 6.6 % of total Hgb — ABNORMAL HIGH (ref <5.7)
Mean Plasma Glucose: 143 (calc)
eAG (mmol/L): 7.9 (calc)

## 2019-12-09 LAB — MICROALBUMIN / CREATININE URINE RATIO
Creatinine, Urine: 158 mg/dL (ref 20–320)
Microalb Creat Ratio: 16 mcg/mg creat (ref <30)
Microalb, Ur: 2.6 mg/dL

## 2019-12-09 LAB — TSH: TSH: 3.3 mIU/L (ref 0.40–4.50)

## 2019-12-09 NOTE — Progress Notes (Signed)
Wesley Garcia's protein in his urine has decreased which is good. Thyroid is in normal range. Sugar average has improved to 6.6 which is excellent as long as he does not get true lows of 70s or below (none reported recently at appt).   Kidney function has declined a bit again--likely not drinking enough water to avoid having to urinate so frequently.  It's crucial that he drink enough. Anemia has improved which is probably part of why he feels a lot better than a few months ago.

## 2019-12-14 ENCOUNTER — Other Ambulatory Visit: Payer: Self-pay | Admitting: Internal Medicine

## 2019-12-15 ENCOUNTER — Ambulatory Visit: Payer: Medicare PPO | Admitting: Orthopaedic Surgery

## 2019-12-15 ENCOUNTER — Encounter: Payer: Self-pay | Admitting: Orthopaedic Surgery

## 2019-12-15 DIAGNOSIS — M1711 Unilateral primary osteoarthritis, right knee: Secondary | ICD-10-CM | POA: Diagnosis not present

## 2019-12-15 NOTE — Progress Notes (Signed)
The patient is a 86 year old gentleman who has known osteoarthritis of his right knee.  A hyaluronic acid injection with Monovisc was placed about 8 weeks ago into the right knee.  He does ambulate with a rolling walker.  He says the knee is doing well.  He does have some soreness and pain still with the right knee and up to the trochanteric area of his right hip.  On exam I can easily put his right hip and right knee through range of motion with no difficulty at all.  He has varus malalignment of the right knee with medial joint line tenderness and some patellofemoral crepitation.  He lacks full extension by few degrees.  His hip moves smoothly on the right side with pain to palpation just over the trochanteric area.  At this point I recommended Voltaren gel for his hip trochanteric area and for his knee.  We can always provide a steroid injection in these areas in about a month now if needed.  All questions and concerns were answered addressed.  Follow-up can be as needed.

## 2020-01-02 ENCOUNTER — Encounter: Payer: Medicare PPO | Admitting: Family

## 2020-01-07 ENCOUNTER — Other Ambulatory Visit: Payer: Self-pay | Admitting: Internal Medicine

## 2020-01-07 ENCOUNTER — Other Ambulatory Visit: Payer: Self-pay | Admitting: Nurse Practitioner

## 2020-01-08 DIAGNOSIS — Z719 Counseling, unspecified: Secondary | ICD-10-CM | POA: Diagnosis not present

## 2020-01-08 DIAGNOSIS — Z1283 Encounter for screening for malignant neoplasm of skin: Secondary | ICD-10-CM | POA: Diagnosis not present

## 2020-01-08 DIAGNOSIS — D2321 Other benign neoplasm of skin of right ear and external auricular canal: Secondary | ICD-10-CM | POA: Diagnosis not present

## 2020-01-08 DIAGNOSIS — Z872 Personal history of diseases of the skin and subcutaneous tissue: Secondary | ICD-10-CM | POA: Diagnosis not present

## 2020-01-08 DIAGNOSIS — Z85828 Personal history of other malignant neoplasm of skin: Secondary | ICD-10-CM | POA: Diagnosis not present

## 2020-01-08 DIAGNOSIS — D230 Other benign neoplasm of skin of lip: Secondary | ICD-10-CM | POA: Diagnosis not present

## 2020-01-08 DIAGNOSIS — D2322 Other benign neoplasm of skin of left ear and external auricular canal: Secondary | ICD-10-CM | POA: Diagnosis not present

## 2020-01-08 DIAGNOSIS — D234 Other benign neoplasm of skin of scalp and neck: Secondary | ICD-10-CM | POA: Diagnosis not present

## 2020-01-08 DIAGNOSIS — Z0189 Encounter for other specified special examinations: Secondary | ICD-10-CM | POA: Diagnosis not present

## 2020-01-08 DIAGNOSIS — D23121 Other benign neoplasm of skin of left upper eyelid, including canthus: Secondary | ICD-10-CM | POA: Diagnosis not present

## 2020-01-17 DIAGNOSIS — I4891 Unspecified atrial fibrillation: Secondary | ICD-10-CM | POA: Diagnosis not present

## 2020-01-17 DIAGNOSIS — S7291XA Unspecified fracture of right femur, initial encounter for closed fracture: Secondary | ICD-10-CM | POA: Diagnosis not present

## 2020-01-17 DIAGNOSIS — R269 Unspecified abnormalities of gait and mobility: Secondary | ICD-10-CM | POA: Diagnosis not present

## 2020-01-17 DIAGNOSIS — L89152 Pressure ulcer of sacral region, stage 2: Secondary | ICD-10-CM | POA: Diagnosis not present

## 2020-01-19 ENCOUNTER — Other Ambulatory Visit: Payer: Self-pay | Admitting: Internal Medicine

## 2020-01-22 ENCOUNTER — Telehealth: Payer: Self-pay | Admitting: Internal Medicine

## 2020-01-22 NOTE — Telephone Encounter (Signed)
Received [place] DMV form to complete for Wesley Garcia.  He is certainly no longer capable of safely operating a motor vehicle.  The letter attached states that if the forms are not completed by 97/84/78, his license will no longer be active.  I don't see a reason to do this several page form when he should not drive anyway and would not be able to legally after 12/10.  Please let [name] know the situation.  Thanks.

## 2020-01-23 NOTE — Telephone Encounter (Signed)
I called [name] and she stated she wanted it filled out for his mental health. She said he is not driving anymore and the keys ae hidden from him. I gave her your message as well.

## 2020-01-27 NOTE — Progress Notes (Signed)
CARDIOLOGY OFFICE NOTE  Date:  02/10/2020    Wesley Garcia Date of Birth: 1929-03-24 Medical Record #[id number]  PCP:  Wesley Curry, DO  Cardiologist:  W[facility]  Chief Complaint  Patient presents with  . Follow-up    Seen for Dr. Curt Garcia & Wesley Garcia    History of Present Illness: Wesley Garcia is a 86 y.o. male who presents today for a follow up visit. Seen for Dr. Irish Garcia and Dr. Curt Garcia.   He has ahistory of CKDIV, DM, HTN, squamous cell carcinoma of hard palate, fall with hip fracture s/p surgical repair 02/27/19, andpersistent atrial fibrillation. He is on Xarelto for anticoagulation. He has seen EP as well. He was cardioverted in February - short lived results. Started on amiodarone. He has required PPM implant back in mid March due to possible tachybrady and subsequent AV nodal ablation later in March.Amiodarone has been stopped.  I saw him in May - he was doing ok and getting stronger from the prior hospitalizations/procedures. Had not tolerated the SNF stay at all - very bad/sad situation but improved at home with home health. His wife is 33 and takes care of him. I changed his Lasix to just prn. On very little medicine and not on ideal therapy. Saw EP in early July - felt to be doing ok - had had another fall and bruised up his face however.  I last saw him in August - very hard of hearing. Family happy with how he was doing. Slowly gaining weight back. No use of Lasix. Felt to be holding his own.    Comes in today. Here with his daughter Wesley Garcia who augments the history. He has had the onset of shortness of breath and chest heaviness over the past week. Little cough as well - he has been vaccinated for COVID 19. Chest discomfort with some minimal activity - like walking back from the bathroom - remains short of breath. Can't take deep breath at times. Cough little productive - looks like "snot". Wife has had a cough as well. No fever. His weight  has been stable at home - they have not had to use any Lasix. He is a DNR.   Past Medical History:  Diagnosis Date  . Chronic kidney disease (CKD), stage III (moderate) (HCC)   . Diabetes mellitus   . Diverticulosis   . Hard of hearing   . Hemorrhoids   . Hx of adenomatous colonic polyps 2006  . Hypertension   . Persistent atrial fibrillation ([place]) 03/2019  . Pleural effusion 05/20/2019  . Presence of permanent cardiac pacemaker 05/21/2019  . 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 AV NODE ABLATION N/A 05/28/2019   Procedure: AV NODE ABLATION;  Surgeon: Wesley Grayer, MD;  Location: [facility];  Service: Cardiovascular;  Laterality: N/A;  . CARDIOVERSION N/A 04/07/2019   Procedure: CARDIOVERSION;  Surgeon: Wesley Hector, MD;  Location: [facility];  Service: Cardiovascular;  Laterality: N/A;  . CARDIOVERSION N/A 05/13/2019   Procedure: CARDIOVERSION;  Surgeon: Wesley Latch, MD;  Location: [facility];  Service: Cardiovascular;  Laterality: N/A;  . CHOLECYSTECTOMY N/A 04/17/2014   Procedure: LAPAROSCOPIC CHOLECYSTECTOMY WITH INTRAOPERATIVE CHOLANGIOGRAM;  Surgeon: Wesley Luna, MD;  Location: [facility];  Service: General;  Laterality: N/A;  . COLONOSCOPY  11/02/2007  . INSERT / REPLACE / REMOVE PACEMAKER    . MAXILLECTOMY Right 2003  SCCA alveolar ridge  . PACEMAKER IMPLANT N/A 05/21/2019   Procedure: PACEMAKER IMPLANT;  Surgeon: Wesley Haw, MD;  Location: [facility];  Service: Cardiovascular;  Laterality: N/A;     Medications: Current Meds  Medication Sig  . acetaminophen (TYLENOL) 500 MG tablet Take 1,000 mg by mouth every 8 (eight) hours as needed for mild pain or headache.   . feeding supplement, ENSURE ENLIVE, (ENSURE ENLIVE) LIQD Take 237 mLs by mouth in the morning, at noon, and at bedtime. Due to very poor oral meal intake.  He is high risk for dehydration and malnutrition  .  furosemide (LASIX) 40 MG tablet TAKE 1 TABLET BY MOUTH DAILY AS NEEDED (WEIGHT GAIN OF 2 OR MORE POUNDS OVERNIGHT/SWELLING.).  Marland Kitchen insulin glargine, 2 Unit Dial, (TOUJEO MAX SOLOSTAR) 300 UNIT/ML Solostar Pen Inject 15 Units into the skin daily before breakfast. E11.22  . levothyroxine (SYNTHROID) 50 MCG tablet Take 1 tablet (50 mcg total) by mouth daily before breakfast.  . magnesium hydroxide (MILK OF MAGNESIA) 400 MG/5ML suspension Take 30 mLs by mouth daily as needed for mild constipation.  . metoprolol succinate (TOPROL-XL) 25 MG 24 hr tablet daily in the afternoon.  . pantoprazole (PROTONIX) 40 MG tablet TAKE 1 TABLET (40 MG TOTAL) BY MOUTH 2 (TWO) TIMES DAILY BEFORE A MEAL.  Marland Kitchen potassium chloride SA (KLOR-CON M20) 20 MEQ tablet Take 1 tablet (20 mEq total) by mouth daily as needed (take only if takes Lasix).  . tamsulosin (FLOMAX) 0.4 MG CAPS capsule TAKE 1 CAPSULE BY MOUTH EVERYDAY AT BEDTIME  . XARELTO 15 MG TABS tablet TAKE 1 TABLET (15 MG TOTAL) BY MOUTH DAILY WITH SUPPER.     Allergies: Allergies  Allergen Reactions  . Tape Other (See Comments)    SKIN IS THIN AND IT TEARS AND BRUISES VERY, VERY EASILY!! PLEASE USE AN ALTERNATIVE.    Social History: The patient  reports that he quit smoking about 19 years ago. His smoking use included cigars. He has never used smokeless tobacco. He reports previous alcohol use. He reports that he does not use drugs.   Family History: The patient's family history includes Bone cancer in his sister; Cancer in his mother; Diabetes in his brother; Kidney disease in his sister.   Review of Systems: Please see the history of present illness.   All other systems are reviewed and negative.   Physical Exam: VS:  BP 118/60   Pulse 80   Ht 5\' 6"  (1.676 m)   Wt 140 lb (63.5 kg)   SpO2 96%   BMI 22.60 kg/m  .  BMI Body mass index is 22.6 kg/m.  Wt Readings from Last 3 Encounters:  02/10/20 140 lb (63.5 kg)  12/08/19 131 lb 6.4 oz (59.6 kg)   10/14/19 134 lb (60.8 kg)    General: Elderly. Alert and in no acute distress.  He is very hard of hearing.  Cardiac: Irregular irregular - rate is ok.  No edema.  Respiratory:  Lungs are coarse to auscultation bilaterally with normal work of breathing.  GI: Soft and nontender.  MS: No deformity or atrophy. Gait not tested - he is in a wheelchair.  Skin: Warm and dry. Color is normal.  Neuro:  Strength and sensation are intact and no gross focal deficits noted.  Psych: Alert, appropriate and with normal affect.   LABORATORY DATA:  EKG:  EKG is ordered today.  Personally reviewed by me. This demonstrates A sensing and V pacing.  Lab  Results  Component Value Date   WBC 7.4 12/08/2019   HGB 12.1 (L) 12/08/2019   HCT 36.8 (L) 12/08/2019   PLT 243 12/08/2019   GLUCOSE 75 12/08/2019   CHOL 86 10/01/2018   TRIG 49 10/01/2018   HDL 47 10/01/2018   LDLCALC 25 10/01/2018   ALT 5 (L) 12/08/2019   AST 12 12/08/2019   NA 141 12/08/2019   K 4.3 12/08/2019   CL 107 12/08/2019   CREATININE 2.04 (H) 12/08/2019   BUN 21 12/08/2019   CO2 26 12/08/2019   TSH 3.30 12/08/2019   HGBA1C 6.6 (H) 12/08/2019   MICROALBUR 2.6 12/08/2019     BNP (last 3 results) Recent Labs    05/20/19 1955  BNP 1,671.4*    ProBNP (last 3 results) No results for input(s): PROBNP in the last 8760 hours.   Other Studies Reviewed Today:  TTE 04/21/2019 Review of the above records today demonstrates:  1. Septal apical distal anterior wall mid and apical inferior wall  hypokinesis Basal function preserved Abnormal GLS -6. Left ventricular  ejection fraction, by estimation, is 25%. The left ventricle has severely  decreased function. The left ventricle  demonstrates regional wall motion abnormalities (see scoring  diagram/findings for description). The left ventricular internal cavity  size was moderately to severely dilated. Left ventricular diastolic  parameters are indeterminate.  2. Right  ventricular systolic function is normal. The right ventricular  size is normal. There is moderately elevated pulmonary artery systolic  pressure.  3. Left atrial size was mildly dilated.  4. The mitral valve is normal in structure and function. Mild mitral  valve regurgitation. No evidence of mitral stenosis.  5. The aortic valve is tricuspid. Aortic valve regurgitation is trivial.  Mild aortic valve sclerosis is present, with no evidence of aortic valve  stenosis.  6. The inferior vena cava is dilated in size with >50% respiratory  variability, suggesting right atrial pressure of 8 mmHg.    ASSESSMENT AND PLAN:  1. Chest pain - with activity - could certainly be angina - would favor medical management- will try some low dose Imdur. Cautioned about headache.   2. Cough - productive sputum - will give ZPak.   3. Persistent AF - rate is ok.   4. Chronic anticoagulation - no problems - labs from October noted. Cost is an issue - will try to give some samples today.   5. Chronic systolic HF - unclear etiology - may have been tachycardia mediated - not on guideline therapy due to soft BP - he is a DNR. Has Lasix to use prn.   6. Tachybrady syndrome - has PPM in place - followed by EP  7. Persistent AF - prior AV nodal ablation - with PPM in place.  8. Advanced age - favor conservative management.    Current medicines are reviewed with the patient today.  The patient does not have concerns regarding medicines other than what has been noted above.  The following changes have been made:  See above.  Labs/ tests ordered today include:    Orders Placed This Encounter  Procedures  . EKG 12-Lead     Disposition:   FU with Dr. Irish Garcia in about 3 months. See PCP if cough persists. Hopefully we can get his chest pain to settle down.     Patient is agreeable to this plan and will call if any problems develop in the interim.   SignedTruitt Merle, NP  02/10/2020 10:35  AM  [facility] [address] [place], [place]  [address] Phone: [phone] Fax: [phone]

## 2020-02-10 ENCOUNTER — Ambulatory Visit: Payer: Medicare PPO | Admitting: Nurse Practitioner

## 2020-02-10 ENCOUNTER — Other Ambulatory Visit: Payer: Self-pay

## 2020-02-10 ENCOUNTER — Encounter: Payer: Self-pay | Admitting: Nurse Practitioner

## 2020-02-10 VITALS — BP 118/60 | HR 80 | Ht 66.0 in | Wt 140.0 lb

## 2020-02-10 DIAGNOSIS — I472 Ventricular tachycardia: Secondary | ICD-10-CM | POA: Diagnosis not present

## 2020-02-10 DIAGNOSIS — I4819 Other persistent atrial fibrillation: Secondary | ICD-10-CM

## 2020-02-10 DIAGNOSIS — I5022 Chronic systolic (congestive) heart failure: Secondary | ICD-10-CM

## 2020-02-10 DIAGNOSIS — Z950 Presence of cardiac pacemaker: Secondary | ICD-10-CM

## 2020-02-10 DIAGNOSIS — Z7901 Long term (current) use of anticoagulants: Secondary | ICD-10-CM | POA: Diagnosis not present

## 2020-02-10 DIAGNOSIS — I4729 Other ventricular tachycardia: Secondary | ICD-10-CM

## 2020-02-10 MED ORDER — ISOSORBIDE MONONITRATE ER 30 MG PO TB24: 15.0000 mg | ORAL_TABLET | Freq: Every day | ORAL | 3 refills | Status: AC

## 2020-02-10 MED ORDER — AZITHROMYCIN 250 MG PO TABS: ORAL_TABLET | ORAL | 0 refills | Status: AC

## 2020-02-10 NOTE — Patient Instructions (Addendum)
After Visit Summary:  We will be checking the following labs today - NONE   Medication Instructions:    Continue with your current medicines. BUT  I am sending in a Zpak - take 2 pills today and then one daily until finished  I am also starting Imdur 30 mg - to take just 1/2 a pill daily - this might cause a headache - but should help with the chest discomfort.    If you need a refill on your cardiac medications before your next appointment, please call your pharmacy.     Testing/Procedures To Be Arranged:  N/A  Follow-Up:   See Dr. Irish Lack in about 3 months    At [facility], you and your health needs are our priority.  As part of our continuing mission to provide you with exceptional heart care, we have created designated Provider Care Teams.  These Care Teams include your primary Cardiologist (physician) and Advanced Practice Providers (APPs -  Physician Assistants and Nurse Practitioners) who all work together to provide you with the care you need, when you need it.  Special Instructions:  . Stay safe, wash your hands for at least 20 seconds and wear a mask when needed.  . It was good to talk with you today.  . If his cough persists - see Dr. Mariea Clonts . If the chest pain does not improve - let us know   Call the [place] office at [phone] if you have any questions, problems or concerns.

## 2020-02-11 DIAGNOSIS — H35033 Hypertensive retinopathy, bilateral: Secondary | ICD-10-CM | POA: Diagnosis not present

## 2020-02-11 DIAGNOSIS — E113511 Type 2 diabetes mellitus with proliferative diabetic retinopathy with macular edema, right eye: Secondary | ICD-10-CM | POA: Diagnosis not present

## 2020-02-11 DIAGNOSIS — E113592 Type 2 diabetes mellitus with proliferative diabetic retinopathy without macular edema, left eye: Secondary | ICD-10-CM | POA: Diagnosis not present

## 2020-02-11 DIAGNOSIS — H31093 Other chorioretinal scars, bilateral: Secondary | ICD-10-CM | POA: Diagnosis not present

## 2020-02-11 DIAGNOSIS — H3561 Retinal hemorrhage, right eye: Secondary | ICD-10-CM | POA: Diagnosis not present

## 2020-02-16 ENCOUNTER — Ambulatory Visit: Payer: Medicare PPO | Admitting: Internal Medicine

## 2020-02-16 DIAGNOSIS — S7291XA Unspecified fracture of right femur, initial encounter for closed fracture: Secondary | ICD-10-CM | POA: Diagnosis not present

## 2020-02-16 DIAGNOSIS — L89152 Pressure ulcer of sacral region, stage 2: Secondary | ICD-10-CM | POA: Diagnosis not present

## 2020-02-16 DIAGNOSIS — I4891 Unspecified atrial fibrillation: Secondary | ICD-10-CM | POA: Diagnosis not present

## 2020-02-16 DIAGNOSIS — R269 Unspecified abnormalities of gait and mobility: Secondary | ICD-10-CM | POA: Diagnosis not present

## 2020-02-17 DIAGNOSIS — D234 Other benign neoplasm of skin of scalp and neck: Secondary | ICD-10-CM | POA: Diagnosis not present

## 2020-02-17 DIAGNOSIS — D2339 Other benign neoplasm of skin of other parts of face: Secondary | ICD-10-CM | POA: Diagnosis not present

## 2020-02-17 DIAGNOSIS — L570 Actinic keratosis: Secondary | ICD-10-CM | POA: Diagnosis not present

## 2020-02-17 DIAGNOSIS — Z809 Family history of malignant neoplasm, unspecified: Secondary | ICD-10-CM | POA: Diagnosis not present

## 2020-02-17 DIAGNOSIS — C4442 Squamous cell carcinoma of skin of scalp and neck: Secondary | ICD-10-CM | POA: Diagnosis not present

## 2020-02-17 DIAGNOSIS — D23111 Other benign neoplasm of skin of right upper eyelid, including canthus: Secondary | ICD-10-CM | POA: Diagnosis not present

## 2020-02-17 DIAGNOSIS — Z1283 Encounter for screening for malignant neoplasm of skin: Secondary | ICD-10-CM | POA: Diagnosis not present

## 2020-02-17 DIAGNOSIS — Z85828 Personal history of other malignant neoplasm of skin: Secondary | ICD-10-CM | POA: Diagnosis not present

## 2020-02-17 DIAGNOSIS — L905 Scar conditions and fibrosis of skin: Secondary | ICD-10-CM | POA: Diagnosis not present

## 2020-02-17 DIAGNOSIS — Z719 Counseling, unspecified: Secondary | ICD-10-CM | POA: Diagnosis not present

## 2020-02-17 DIAGNOSIS — D230 Other benign neoplasm of skin of lip: Secondary | ICD-10-CM | POA: Diagnosis not present

## 2020-02-19 ENCOUNTER — Ambulatory Visit (INDEPENDENT_AMBULATORY_CARE_PROVIDER_SITE_OTHER): Payer: Medicare PPO

## 2020-02-19 DIAGNOSIS — I495 Sick sinus syndrome: Secondary | ICD-10-CM | POA: Diagnosis not present

## 2020-02-19 LAB — CUP PACEART REMOTE DEVICE CHECK
Battery Remaining Longevity: 97 mo
Battery Remaining Percentage: 95.5 %
Battery Voltage: 3.01 V
Brady Statistic AP VP Percent: 26 %
Brady Statistic AP VS Percent: 1 %
Brady Statistic AS VP Percent: 74 %
Brady Statistic AS VS Percent: 1 %
Brady Statistic RA Percent Paced: 26 %
Brady Statistic RV Percent Paced: 99 %
Date Time Interrogation Session: 20211216020013
Implantable Lead Implant Date: 20210317
Implantable Lead Implant Date: 20210317
Implantable Lead Location: 753859
Implantable Lead Location: 753860
Implantable Pulse Generator Implant Date: 20210317
Lead Channel Impedance Value: 380 Ohm
Lead Channel Impedance Value: 440 Ohm
Lead Channel Pacing Threshold Amplitude: 0.75 V
Lead Channel Pacing Threshold Amplitude: 1 V
Lead Channel Pacing Threshold Pulse Width: 0.5 ms
Lead Channel Pacing Threshold Pulse Width: 0.8 ms
Lead Channel Sensing Intrinsic Amplitude: 12 mV
Lead Channel Sensing Intrinsic Amplitude: 2.5 mV
Lead Channel Setting Pacing Amplitude: 2 V
Lead Channel Setting Pacing Amplitude: 2.5 V
Lead Channel Setting Pacing Pulse Width: 0.5 ms
Lead Channel Setting Sensing Sensitivity: 4 mV
Pulse Gen Model: 2272
Pulse Gen Serial Number: 3803323

## 2020-02-23 ENCOUNTER — Other Ambulatory Visit: Payer: Self-pay | Admitting: Internal Medicine

## 2020-03-02 DIAGNOSIS — R404 Transient alteration of awareness: Secondary | ICD-10-CM | POA: Diagnosis not present

## 2020-03-02 DIAGNOSIS — I6389 Other cerebral infarction: Secondary | ICD-10-CM | POA: Diagnosis not present

## 2020-03-02 DIAGNOSIS — E440 Moderate protein-calorie malnutrition: Secondary | ICD-10-CM | POA: Diagnosis not present

## 2020-03-02 DIAGNOSIS — G9341 Metabolic encephalopathy: Secondary | ICD-10-CM | POA: Diagnosis not present

## 2020-03-02 DIAGNOSIS — Z515 Encounter for palliative care: Secondary | ICD-10-CM | POA: Diagnosis not present

## 2020-03-02 DIAGNOSIS — I361 Nonrheumatic tricuspid (valve) insufficiency: Secondary | ICD-10-CM | POA: Diagnosis not present

## 2020-03-02 DIAGNOSIS — I10 Essential (primary) hypertension: Secondary | ICD-10-CM | POA: Diagnosis not present

## 2020-03-02 DIAGNOSIS — I480 Paroxysmal atrial fibrillation: Secondary | ICD-10-CM | POA: Diagnosis not present

## 2020-03-02 DIAGNOSIS — E162 Hypoglycemia, unspecified: Secondary | ICD-10-CM | POA: Diagnosis not present

## 2020-03-02 DIAGNOSIS — I351 Nonrheumatic aortic (valve) insufficiency: Secondary | ICD-10-CM | POA: Diagnosis not present

## 2020-03-02 DIAGNOSIS — R4020 Unspecified coma: Secondary | ICD-10-CM | POA: Diagnosis not present

## 2020-03-02 DIAGNOSIS — I495 Sick sinus syndrome: Secondary | ICD-10-CM | POA: Diagnosis not present

## 2020-03-02 DIAGNOSIS — R402431 Glasgow coma scale score 3-8, in the field [EMT or ambulance]: Secondary | ICD-10-CM | POA: Diagnosis not present

## 2020-03-02 DIAGNOSIS — Z66 Do not resuscitate: Secondary | ICD-10-CM | POA: Diagnosis not present

## 2020-03-02 DIAGNOSIS — J90 Pleural effusion, not elsewhere classified: Secondary | ICD-10-CM | POA: Diagnosis not present

## 2020-03-02 DIAGNOSIS — E11649 Type 2 diabetes mellitus with hypoglycemia without coma: Secondary | ICD-10-CM | POA: Diagnosis not present

## 2020-03-02 DIAGNOSIS — R0902 Hypoxemia: Secondary | ICD-10-CM | POA: Diagnosis not present

## 2020-03-02 DIAGNOSIS — I340 Nonrheumatic mitral (valve) insufficiency: Secondary | ICD-10-CM | POA: Diagnosis not present

## 2020-03-02 DIAGNOSIS — N179 Acute kidney failure, unspecified: Secondary | ICD-10-CM | POA: Diagnosis not present

## 2020-03-02 DIAGNOSIS — R4182 Altered mental status, unspecified: Secondary | ICD-10-CM | POA: Diagnosis not present

## 2020-03-02 DIAGNOSIS — I5041 Acute combined systolic (congestive) and diastolic (congestive) heart failure: Secondary | ICD-10-CM | POA: Diagnosis not present

## 2020-03-02 DIAGNOSIS — Z20822 Contact with and (suspected) exposure to covid-19: Secondary | ICD-10-CM | POA: Diagnosis not present

## 2020-03-02 DIAGNOSIS — I420 Dilated cardiomyopathy: Secondary | ICD-10-CM | POA: Diagnosis not present

## 2020-03-04 NOTE — Progress Notes (Signed)
Remote pacemaker transmission.   

## 2020-03-09 ENCOUNTER — Telehealth: Payer: Self-pay | Admitting: Interventional Cardiology

## 2020-03-09 NOTE — Telephone Encounter (Signed)
     [place] called, she said she faxed paperwork to Dr. Irish Lack, it needs his signature. She wanted to confirm if the paperwork was received and when can she get it back.

## 2020-03-10 NOTE — Telephone Encounter (Signed)
Kayla calling back. She states the patient is in their office waiting and they need the form faxed back ASAP.

## 2020-03-10 NOTE — Telephone Encounter (Signed)
Called and let Wesley Garcia know that her fax was received with the form regarding MRI compatibility for the patient's PPM. Made her aware that Dr. Curt Bears manages the patient's PPM, not Dr. Irish Lack. Made her aware that I have spoke with one of the device RNs who is going to look into this patient's device and will fax back the form once completed. She verbalized understanding.

## 2020-03-10 NOTE — Telephone Encounter (Signed)
Lonn Georgia is calling back to see if Dr. Irish Lack was able to fill out the form she faxed on 03/09/20 due to not receiving it back yet. Please advise.

## 2020-03-10 NOTE — Telephone Encounter (Signed)
[employer] device MRI compatible. Form completed and faxed to facility.

## 2020-03-11 ENCOUNTER — Telehealth: Payer: Medicare PPO | Admitting: *Deleted

## 2020-03-11 ENCOUNTER — Ambulatory Visit: Payer: Medicare PPO | Admitting: Internal Medicine

## 2020-03-11 NOTE — Telephone Encounter (Signed)
Patient daughter in [place] called and stated that patient has been in the [facility] since last Wednesday. Stated that they though patient was having an episode with Low Blood Sugar and sent him. Patient went into the hospital talking and walking and now he is not speaking, walking and sleeping all the time. Not responding.  The Hospital is now saying there is nothing else they can do. Stated that they can't find any evidence of stroke and they have ran every test.  Stated that they were going to do a Brain MRI and had to call a Dr. From [employer] to stop his Pacemaker but they could not get him in the machine. The hospital is telling them the only thing they can do is send him to a [facility] [facility] and that is going to be self pay.  The insurance won't cover Rehab because they said he was not able to do Rehab due to the state he is in.   Daughter is wanting to know your advise. Stated you know the patient. She feels the hospital is not doing all they can. Wants to know what she should say to them.  Please Advise.

## 2020-03-13 NOTE — Telephone Encounter (Signed)
No interventions needed by [facility].  Spoke with Wesley Garcia.  Tells me that Wesley Garcia laid down for a nap a week ago Tuesday and Wesley Garcia could not wake him up.  She called Wesley Garcia who told her to call 911, but she waited.  Wesley Garcia arrived and he was slumped to the right in his chair, unresponsive.  When EMS did arrive, the CBG was 35.  They would not give glucose until he got to the ED, she says which was 30 mins later in [place].  He was unresponsive for over a week though his vitals were normal.  He's not been eating.  He was waking up to people and looking at them and seems like maybe he recognizes his SIL who is a Theme park manager, but cannot make words and cannot swallow when tested.  There was no evidence of stroke with CT and MRI could not be done with pacemaker and challenges getting him into machine after pacemaker folks did approve it so being treated like it was a stroke.  They did wind up transferring him to SNF which is out of pocket and he's now receiving comfort care through SNF and hospice services.  His living will did indicate no feeding tubes.  We talked about that likely he did have a stroke though MRI could not be done to confirm.  We also discussed that previously after his hip fx and his challenges with bradycardia/pacemaker need, he'd repeatedly said he did not want [place] to prolong his life.  He did improve after that, but knowing him, we would not expect him to want to live in this condition.  They agree.  They just wanted some affirmation from me that they'd made the right decision because I do know him for about 10 yrs now.  Wesley Garcia also had wanted my input which Wesley Garcia planned to pass along.

## 2020-03-16 ENCOUNTER — Other Ambulatory Visit: Payer: Self-pay | Admitting: Nurse Practitioner

## 2020-04-04 ENCOUNTER — Other Ambulatory Visit: Payer: Self-pay | Admitting: Nurse Practitioner

## 2020-04-04 ENCOUNTER — Other Ambulatory Visit: Payer: Self-pay | Admitting: Internal Medicine

## 2020-04-09 ENCOUNTER — Other Ambulatory Visit: Payer: Self-pay | Admitting: Internal Medicine

## 2020-04-22 ENCOUNTER — Ambulatory Visit: Payer: Medicare PPO | Admitting: Internal Medicine

## 2020-05-10 ENCOUNTER — Ambulatory Visit: Payer: Medicare PPO | Admitting: Interventional Cardiology

## 2020-10-19 IMAGING — CR DG CHEST 2V
2 series · 2 of 2 positions shown · non-contrast
Comparison: Earlier same day

CLINICAL DATA: Short of breath. History of congestive heart
failure.

EXAM:
CHEST - 2 VIEW

[chest lat]
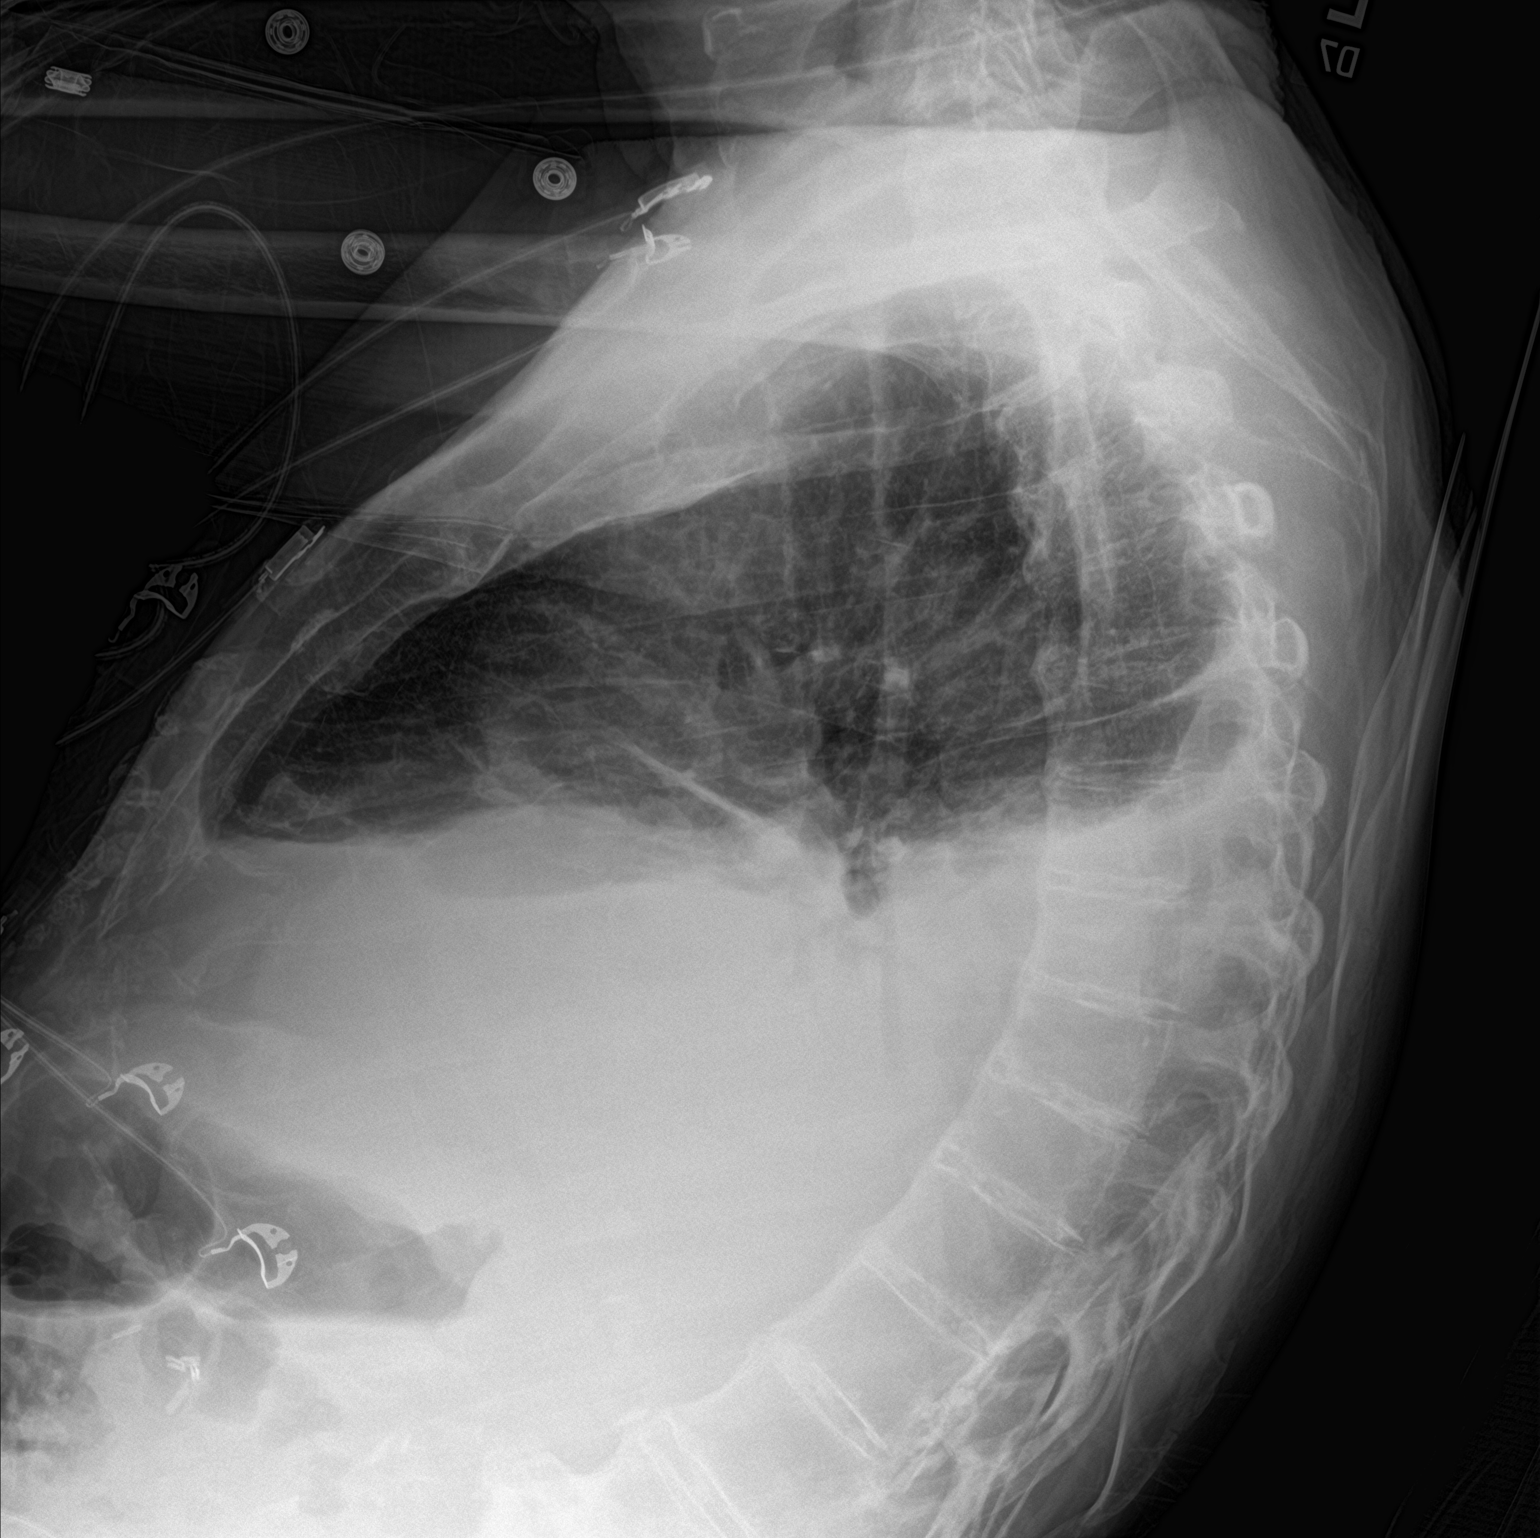

[chest ap]
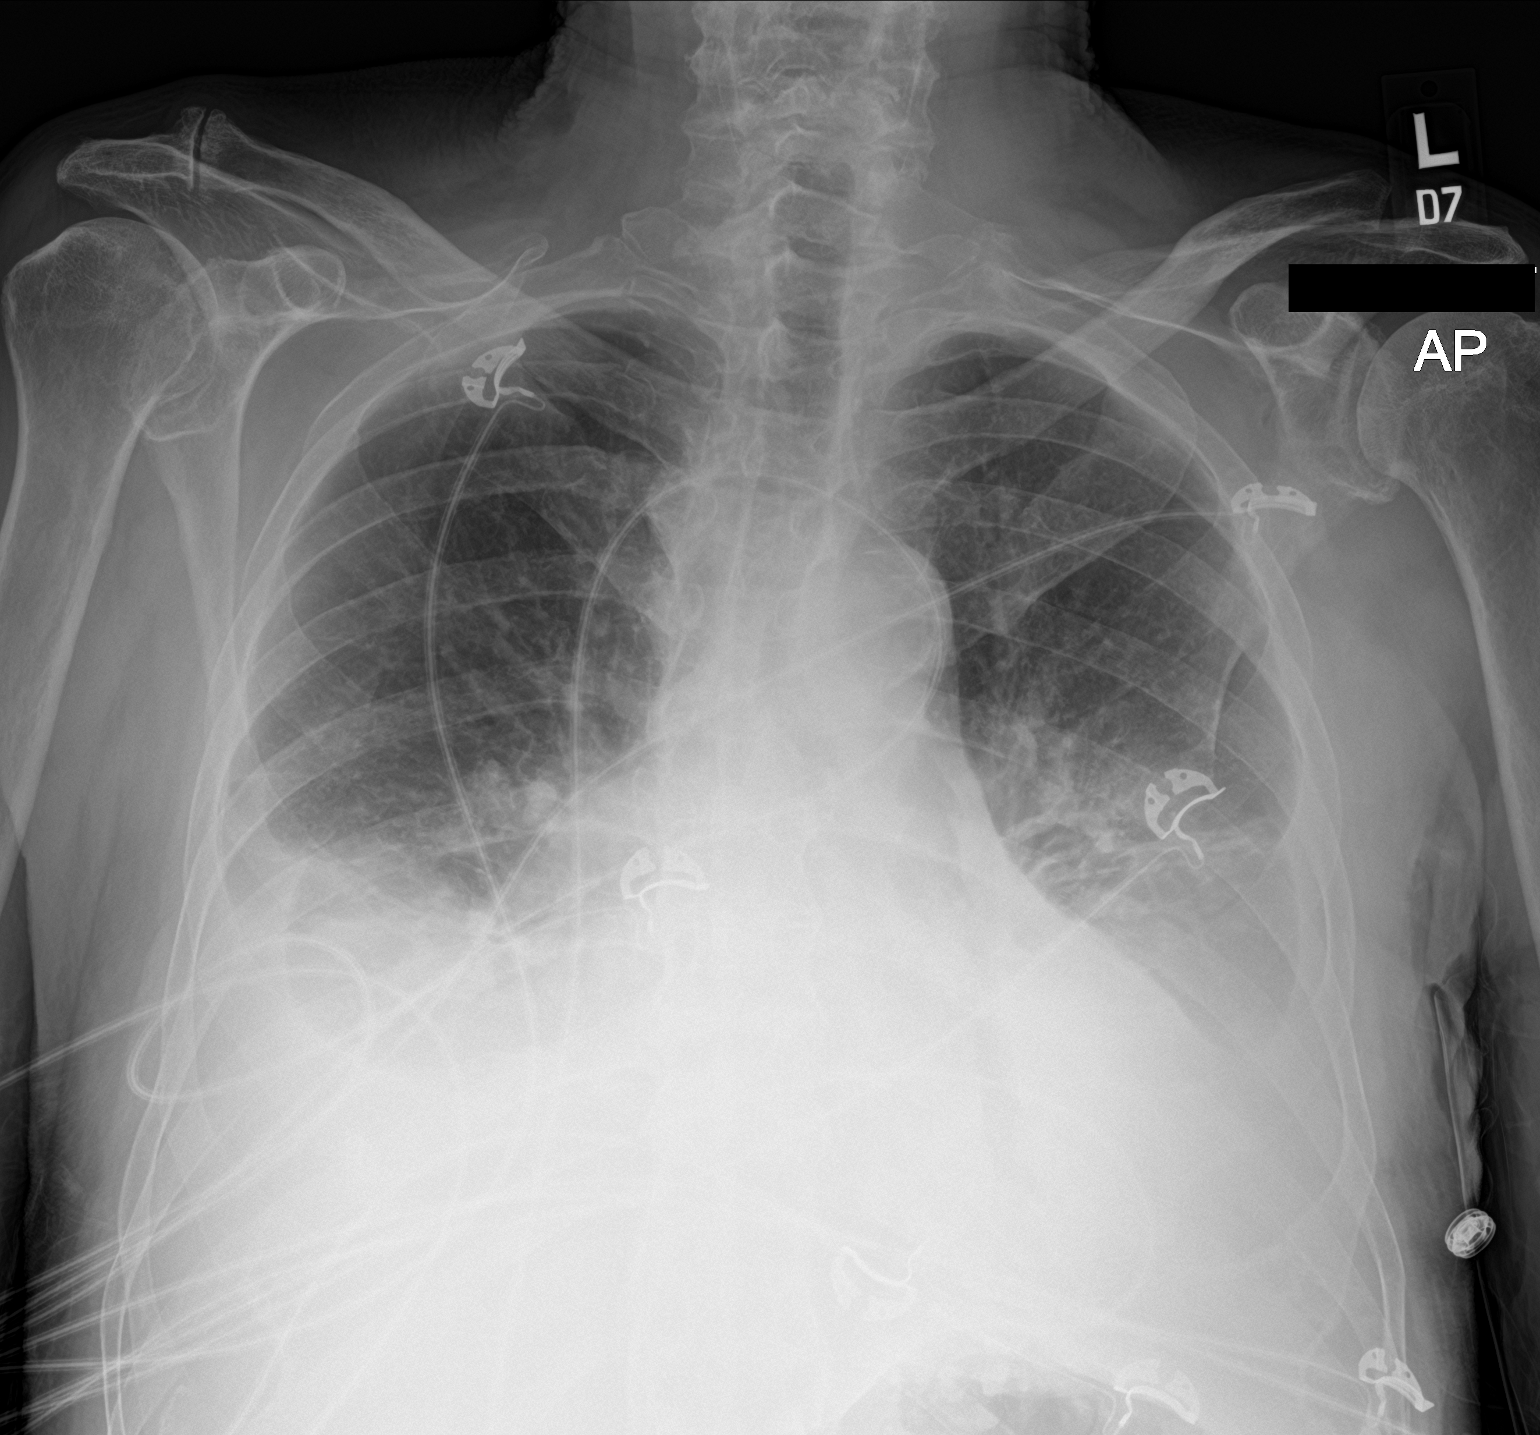

[2 of 2 positions shown; findings below may reference images not displayed]

FINDINGS: No discernible change. Bilateral pleural effusions with atelectasis
in the lower lungs. Venous hypertension with mild interstitial
edema. Upper lungs are reasonably well aerated.
IMPRESSION: No change since earlier. Congestive heart failure. Bilateral
effusions with lower lung atelectasis. Pulmonary venous hypertension
with mild interstitial edema.

## 2020-10-21 IMAGING — CR DG CHEST 2V
2 series · 2 of 2 positions shown · non-contrast
Comparison: 05/20/2019

CLINICAL DATA: Post cardiac device insertion

EXAM:
CHEST - 2 VIEW

[chest lat]
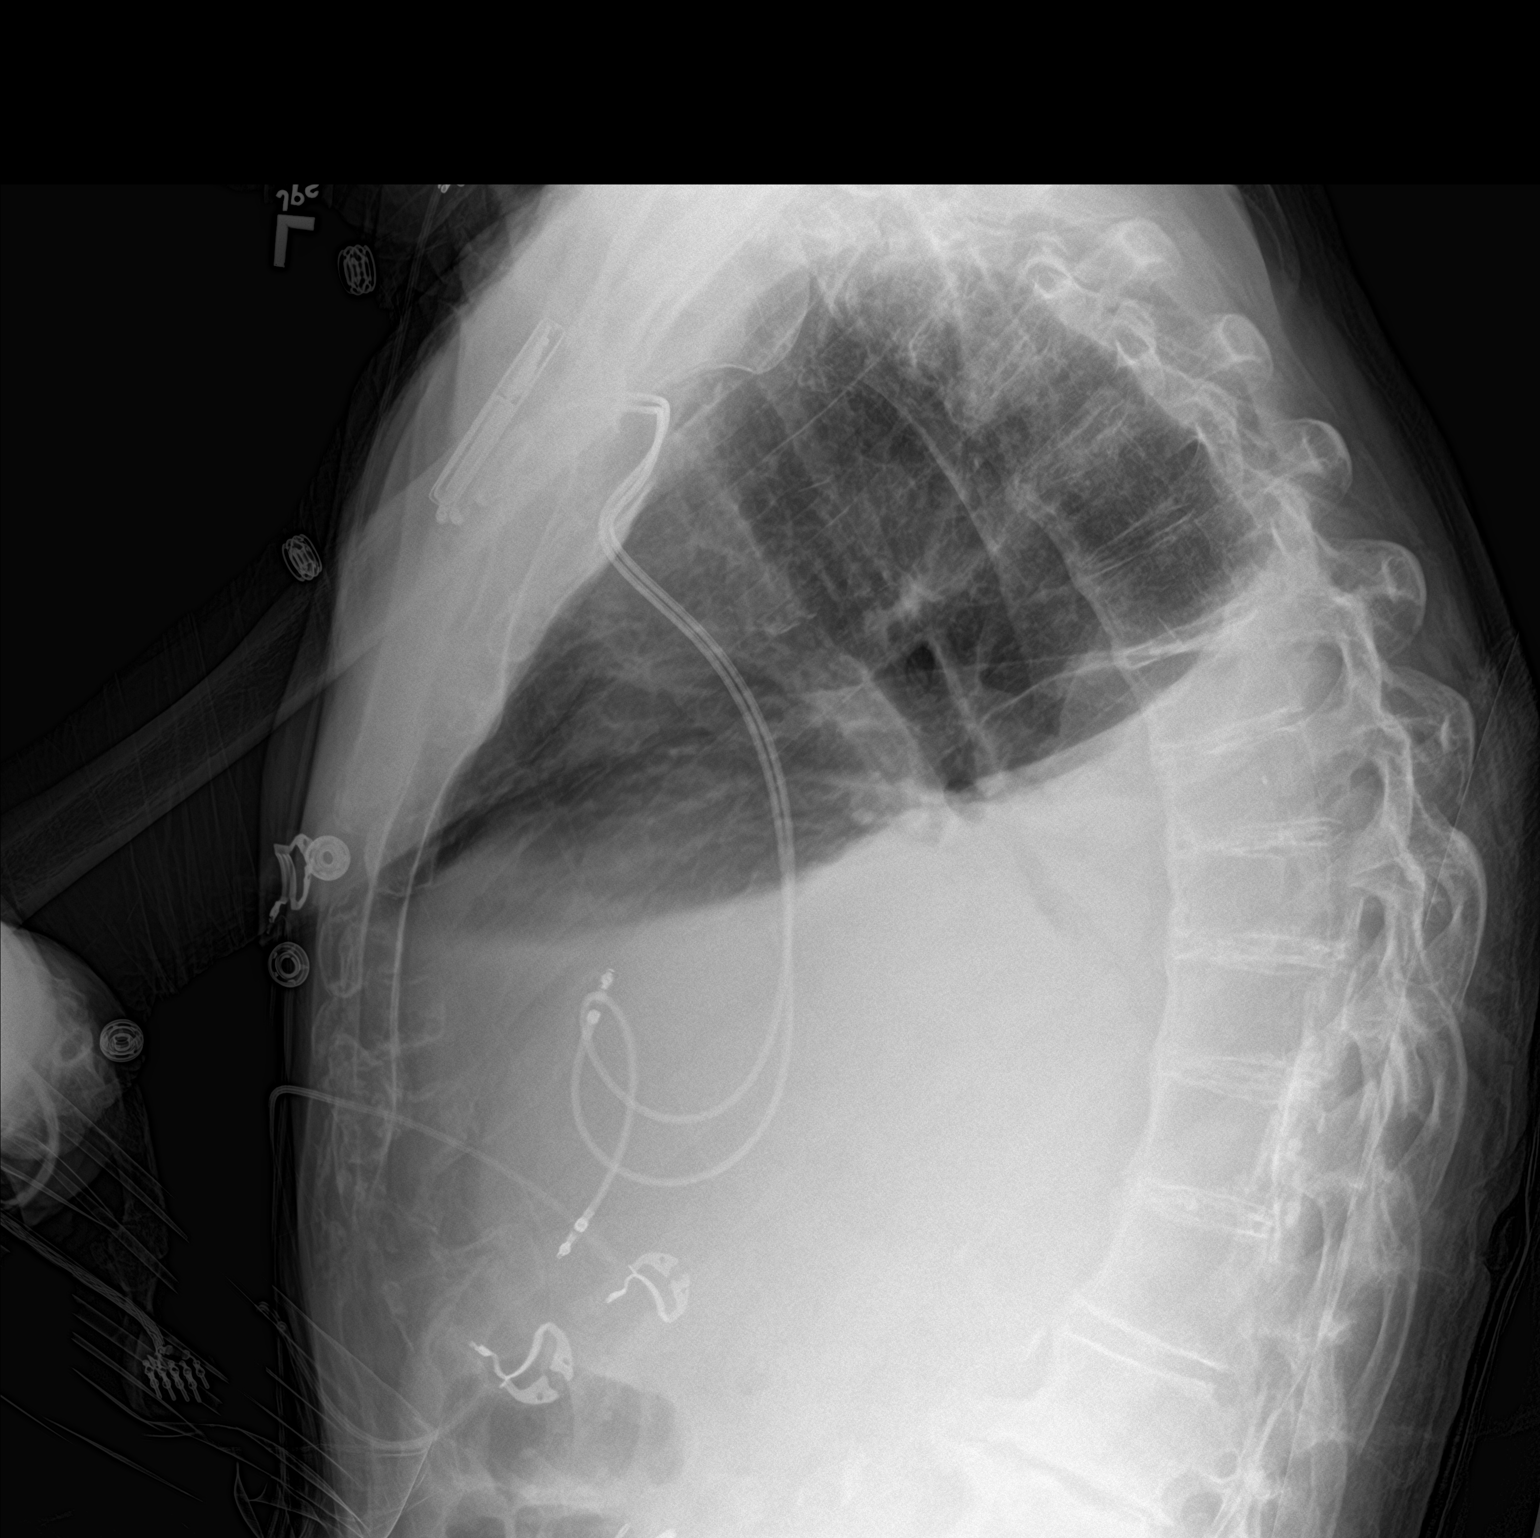

[chest ap]
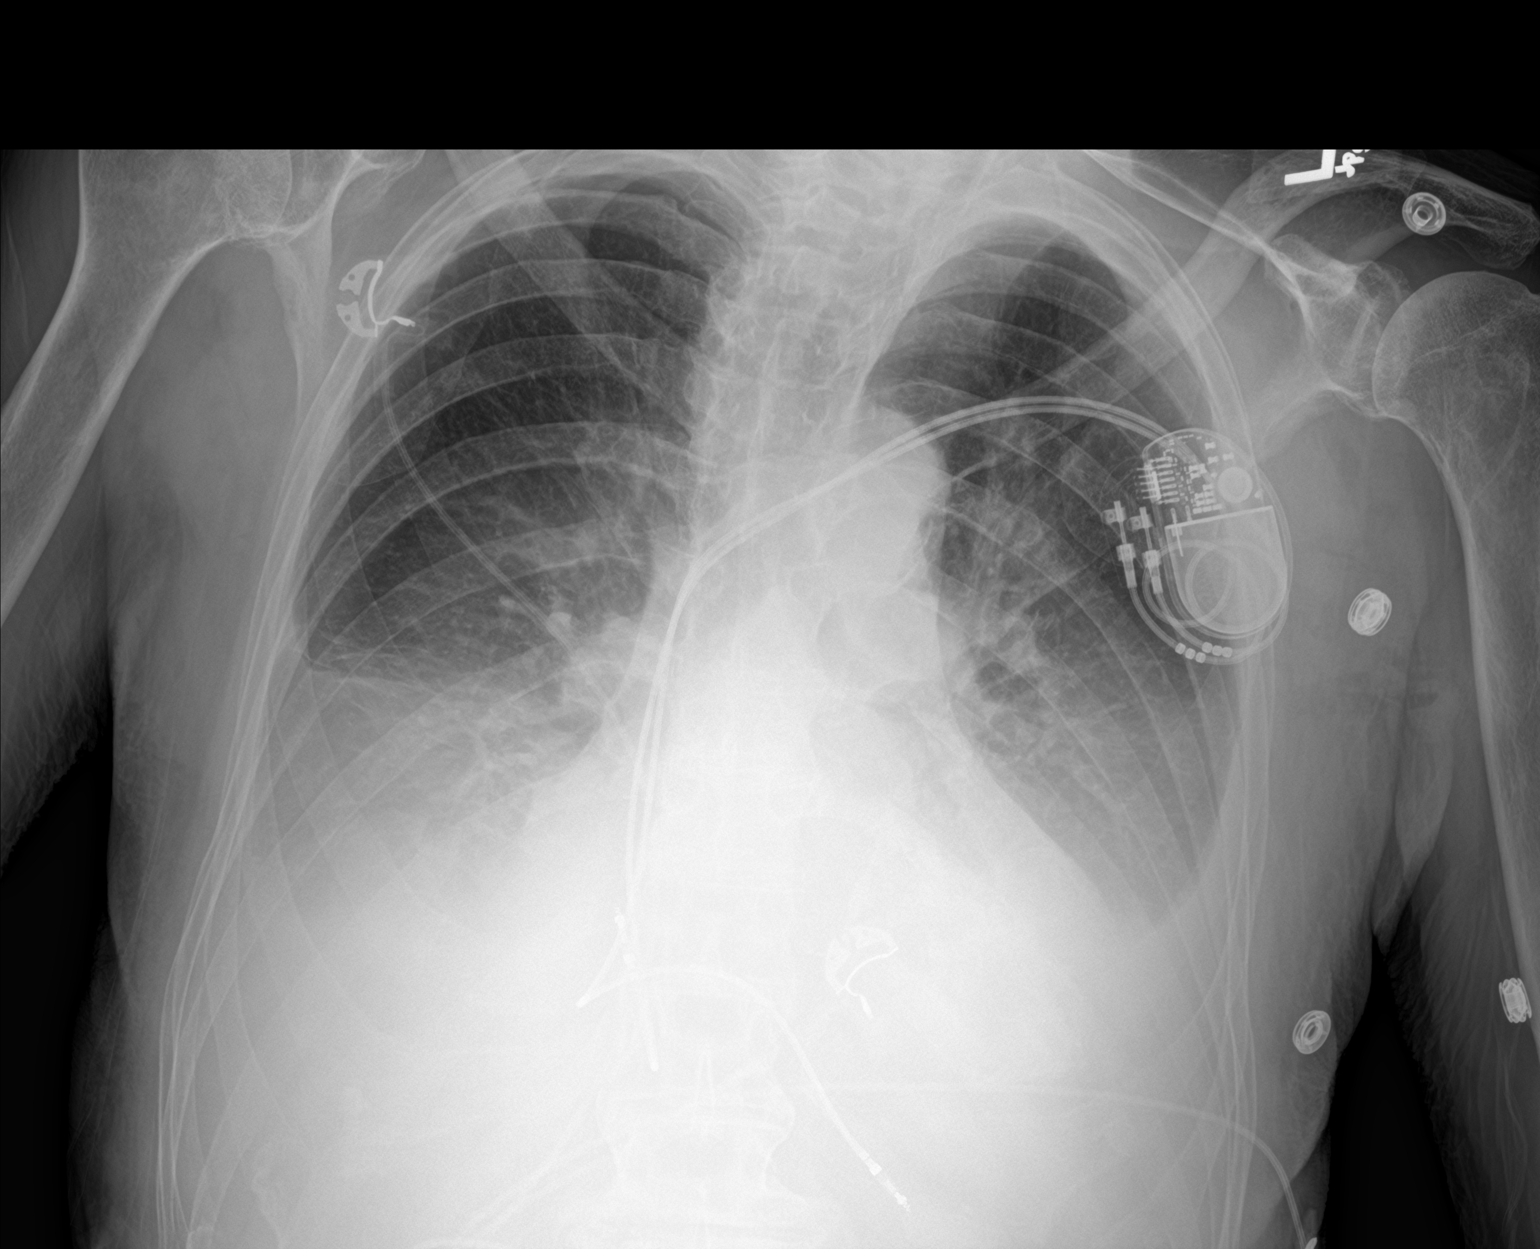

[2 of 2 positions shown; findings below may reference images not displayed]

FINDINGS: New LEFT subclavian transvenous pacemaker with leads projecting at
RIGHT atrium and RIGHT ventricle.

Rotated to the LEFT.

Borderline enlargement of cardiac silhouette.

Mediastinal contours and pulmonary vascularity normal for technique.

Bibasilar pleural effusions and atelectasis.

Upper lungs clear.

No definite infiltrate or pneumothorax.

Bones demineralized.
IMPRESSION: No pneumothorax following pacemaker insertion.

BILATERAL pleural effusions and bibasilar atelectasis.

## 2020-10-23 IMAGING — CR DG CHEST 2V
2 series · 2 of 2 positions shown · non-contrast
Comparison: Chest radiograph 05/22/2019

CLINICAL DATA: Pleural effusion.

EXAM:
CHEST - 2 VIEW

[chest ap]
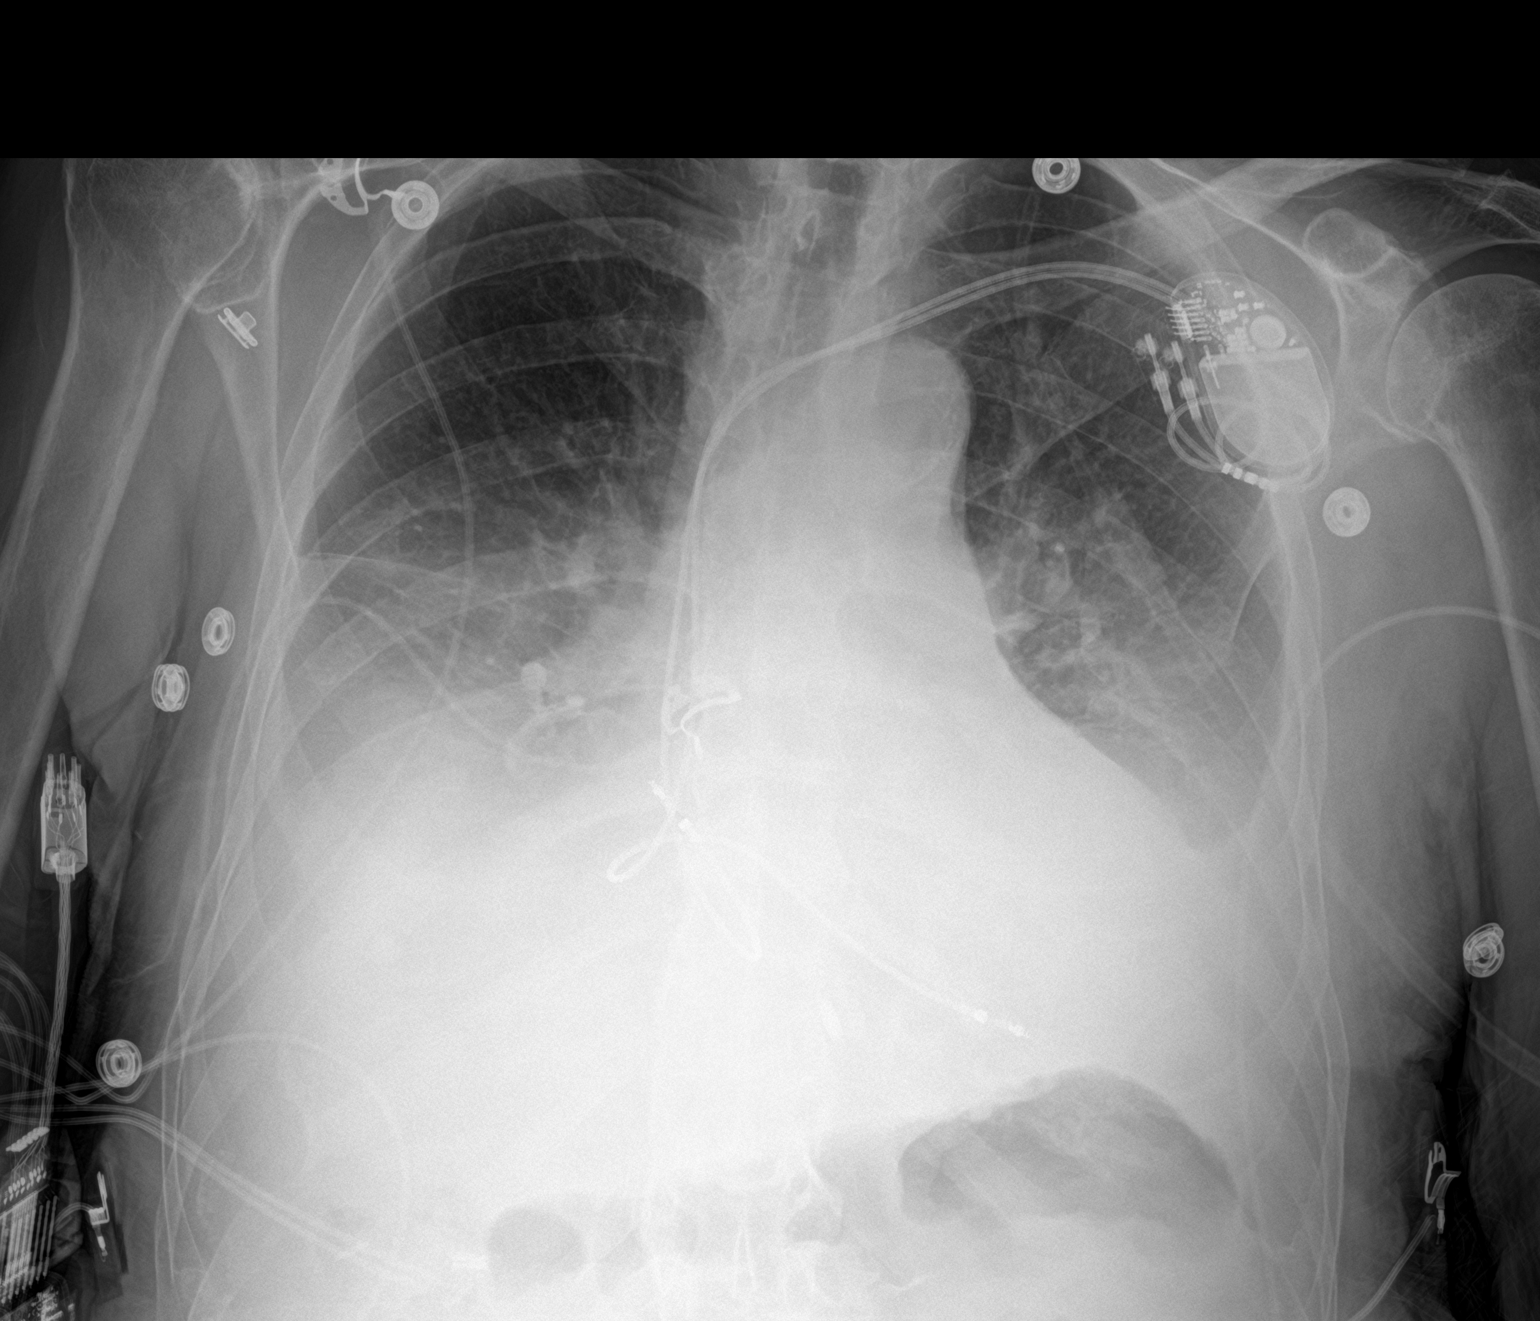

[chest lat]
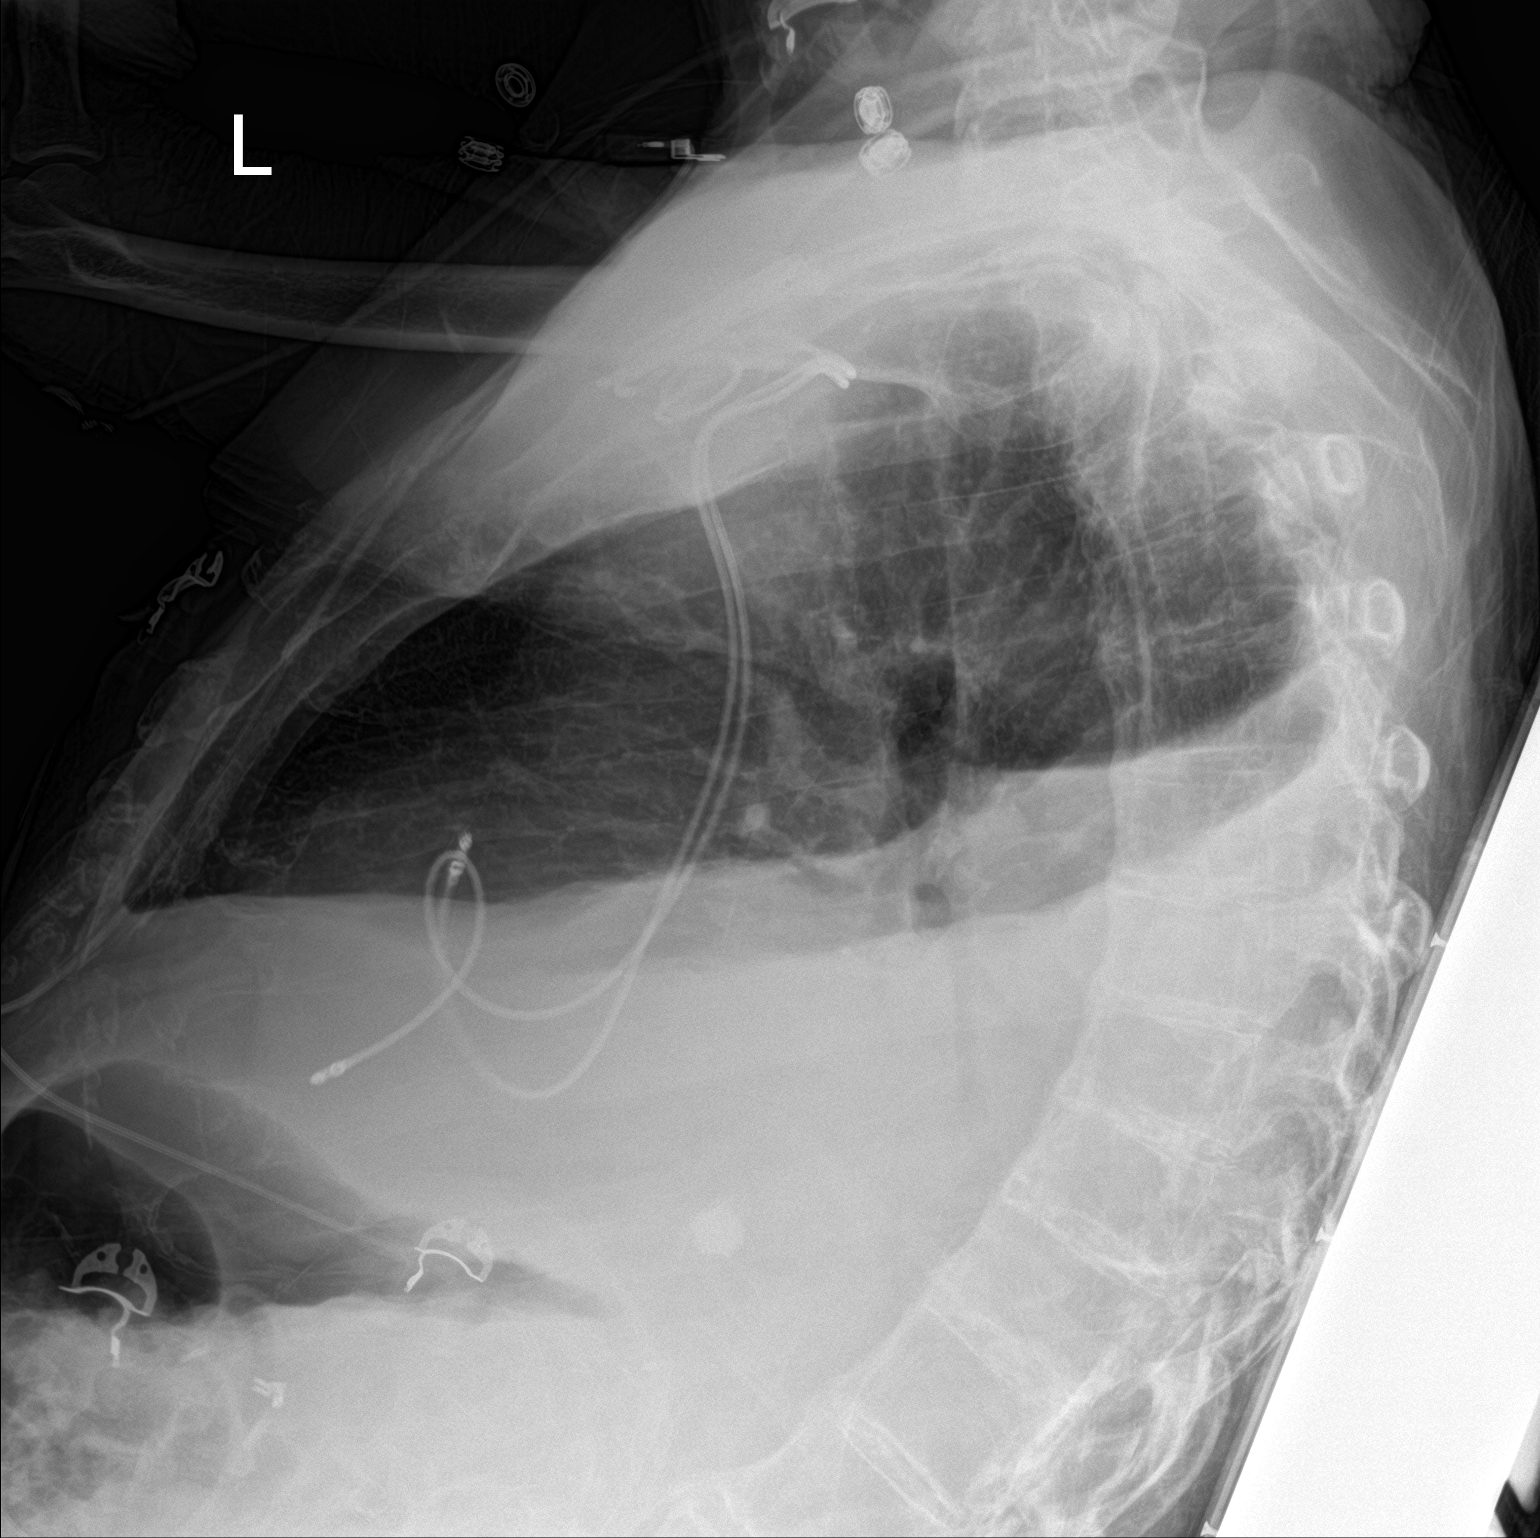

[2 of 2 positions shown; findings below may reference images not displayed]

FINDINGS: Redemonstrated left chest dual lead AICD. Overlying cardiac
monitoring leads.

Borderline enlarged heart size, unchanged. Aortic atherosclerosis.
Unchanged bilateral pleural effusions and associated bibasilar
atelectasis. The upper lungs remain clear. No evidence of
pneumothorax. No acute bony abnormality
IMPRESSION: Stable examination as compared to 05/22/2019.

Unchanged bilateral pleural effusions and associated bibasilar
atelectasis.
